# Patient Record
Sex: Female | Born: 1945 | Race: White | Hispanic: No | Marital: Single | Smoking: Never smoker
Health system: Southern US, Community
[De-identification: ages and names within clinical notes are randomized; demographics above are authoritative.]

## PROBLEM LIST (undated history)

## (undated) DIAGNOSIS — M1990 Unspecified osteoarthritis, unspecified site: Secondary | ICD-10-CM

## (undated) DIAGNOSIS — Z9289 Personal history of other medical treatment: Secondary | ICD-10-CM

## (undated) DIAGNOSIS — S7290XA Unspecified fracture of unspecified femur, initial encounter for closed fracture: Secondary | ICD-10-CM

## (undated) DIAGNOSIS — D649 Anemia, unspecified: Secondary | ICD-10-CM

## (undated) DIAGNOSIS — I10 Essential (primary) hypertension: Secondary | ICD-10-CM

## (undated) DIAGNOSIS — R1310 Dysphagia, unspecified: Secondary | ICD-10-CM

## (undated) DIAGNOSIS — R011 Cardiac murmur, unspecified: Secondary | ICD-10-CM

## (undated) DIAGNOSIS — Z87442 Personal history of urinary calculi: Secondary | ICD-10-CM

## (undated) DIAGNOSIS — K219 Gastro-esophageal reflux disease without esophagitis: Secondary | ICD-10-CM

## (undated) DIAGNOSIS — F419 Anxiety disorder, unspecified: Secondary | ICD-10-CM

## (undated) DIAGNOSIS — N189 Chronic kidney disease, unspecified: Secondary | ICD-10-CM

## (undated) HISTORY — PX: ABDOMINAL HYSTERECTOMY: SHX81

## (undated) HISTORY — PX: TUBAL LIGATION: SHX77

---

## 2003-07-02 ENCOUNTER — Ambulatory Visit (HOSPITAL_COMMUNITY): Admission: RE | Admit: 2003-07-02 | Discharge: 2003-07-02 | Payer: Self-pay | Admitting: Family Medicine

## 2007-09-13 ENCOUNTER — Ambulatory Visit (HOSPITAL_COMMUNITY): Admission: RE | Admit: 2007-09-13 | Discharge: 2007-09-13 | Payer: Self-pay | Admitting: Family Medicine

## 2007-09-15 ENCOUNTER — Ambulatory Visit (HOSPITAL_COMMUNITY): Admission: RE | Admit: 2007-09-15 | Discharge: 2007-09-15 | Payer: Self-pay | Admitting: Family Medicine

## 2007-09-24 ENCOUNTER — Emergency Department (HOSPITAL_COMMUNITY): Admission: EM | Admit: 2007-09-24 | Discharge: 2007-09-24 | Payer: Self-pay | Admitting: Emergency Medicine

## 2009-02-26 ENCOUNTER — Ambulatory Visit (HOSPITAL_COMMUNITY): Admission: RE | Admit: 2009-02-26 | Discharge: 2009-02-26 | Payer: Self-pay | Admitting: Family Medicine

## 2009-03-20 ENCOUNTER — Ambulatory Visit (HOSPITAL_COMMUNITY): Admission: RE | Admit: 2009-03-20 | Discharge: 2009-03-20 | Payer: Self-pay | Admitting: Family Medicine

## 2009-04-17 ENCOUNTER — Ambulatory Visit (HOSPITAL_COMMUNITY): Admission: RE | Admit: 2009-04-17 | Discharge: 2009-04-17 | Payer: Self-pay | Admitting: Family Medicine

## 2009-05-31 HISTORY — PX: FEMUR SURGERY: SHX943

## 2010-05-21 ENCOUNTER — Inpatient Hospital Stay (HOSPITAL_COMMUNITY)
Admission: EM | Admit: 2010-05-21 | Discharge: 2010-05-25 | Disposition: A | Discharge status: Rehabilitation Facility | Payer: Self-pay | Source: Home / Self Care | Attending: Orthopedic Surgery | Admitting: Orthopedic Surgery

## 2010-05-25 ENCOUNTER — Inpatient Hospital Stay (HOSPITAL_COMMUNITY)
Admission: RE | Admit: 2010-05-25 | Discharge: 2010-05-30 | Payer: Self-pay | Attending: Physical Medicine & Rehabilitation | Admitting: Physical Medicine & Rehabilitation

## 2010-06-19 NOTE — Discharge Summary (Signed)
[name], [name] NO.:  [id number]  MEDICAL RECORD NO.:  [id number]          PATIENT TYPE:  IPS  LOCATION:  [id number]                         FACILITY:  [facility]  PHYSICIAN:  Beth Malone, M.D.DATE OF BIRTH:  17-Jan-1946  DATE OF ADMISSION:  05/25/2010 DATE OF DISCHARGE:  05/30/2010                              DISCHARGE SUMMARY   DISCHARGE DIAGNOSES: 1. Right supracondylar femur fracture, comminuted and displaced with     ORIF for repair. 2. Diabetes mellitus type 2. 3. Hypertension. 4. Acute blood loss anemia. 5. Hypokalemia. 6. Dyslipidemia. 7. History of proteinuria.  HISTORY OF PRESENT ILLNESS:  Beth Malone is a 65 year old female with history of diabetes mellitus, hypertension who slipped on wet floor at work falling onto her flexed right knee on December 22.  X-rays done showed distal right comminuted supracondylar femur fracture.  The patient underwent ORIF of right supracondylar femur fracture by Dr. Doran Malone on the same day.  Postop is nonweightbearing on right lower extremity with Bledsoe brace locked out at 0 degrees for full extension. Therapy is initiated and currently the patient with difficulty maintaining nonweightbearing status with increased mobility as well as fatigue as well as problems with fatigue.  She is noted to have decrease in endurance level.  Mild hypokalemia noted.  She has also had issues with leukocytosis, question reactive.  This is resolving.  She continues on Coumadin for DVT prophylaxis.  The patient was evaluated by rehab. It was felt that she would benefit from a rehab stay.  PAST MEDICAL HISTORY:  Significant for bilateral knee scope diabetes mellitus, hypertension, hysterectomy in 1981, and dyslipidemia.  ALLERGIES:  Codeine.  FAMILY HISTORY:  Positive for coronary artery disease.  SOCIAL HISTORY:  The patient lives with her daughter and 11 year old mother in 1-level home with ramp at entry.  Has not used any  alcohol since 1981.  Does not use any tobacco.  Mother has a caregiver and daughter currently works days.  FUNCTIONAL HISTORY:  The patient was independent and working prior to admission.  FUNCTIONAL STATUS:  The patient is plus to total assist 60% for transfers, plus to total assist 50-70% for ambulating 5-6 feet forward and backwards.  She is supervision to min assist for upper body care, max assist for lower body care, total assist for toileting.  PHYSICAL EXAM:  VITALS:  Blood pressure 119/75, pulse 100, respiratory rate 18, temperature 99.0. GENERAL:  The patient is pleasant female, alert and oriented x3. HEENT:  Pupils equal, round, reactive to light.  Oral mucosa is pink and moist.  Hearing intact. NECK:  Supple without JVD or lymphadenopathy. HEART:  Regular rate and rhythm without murmurs or gallops. LUNGS: Clear to auscultation bilaterally without wheezes, rales, or rhonchi. ABDOMEN:  Soft, nontender, positive bowel sounds. SKIN:  Notable for dry skin throughout.  Right lateral thigh incision is clean and dry with sutures in place and some old dried blood surrounding the wound.  Noted to have old scar on right cheek. NEUROLOGIC:  Cranial nerves II-XII grossly intact.  Reflexes 1+. Sensation normal.  Judgment, orientation, memory, mood appropriate. Strength in upper  extremities 2+to 5/5 right lower extremity and 1/5 proximally at hip with pain at straight leg raise.  Did not test knee ankle due to Bledsoe brace in place.  Left lower extremities, 3-4/5 at hip to knee and 5/5 at the ankle plantar dorsiflexors.  HOSPITAL COURSE:  Ms. Beth Malone was admitted to rehab on May 25, 2010, for inpatient therapies to consist of PT, OT at least 3 hours 5 days a week.  Past admission physiatrist, rehab RN, and therapy team have worked together to provide customized collaborative interdisciplinary care.  Rehab RN has worked with patient on bowel and bladder program as well as has  helped with pain management.  The patient's blood pressures were checked on b.i.d. basis during this stay and these have been well controlled ranging from low [id number] to [id number] systolics and diastolics in 0000000 range.  Last weight is at 93 kg.  The patient's right thigh incision has been monitored along.  This is noted to be healing well without any signs or symptoms of infection.  Sutures were discontinued and area Steri-Stripped on December 31 without difficulty.  The patient's blood sugars have been checked on a.c. h.s. basis.  These have been reasonable ranging from 110-120s overall.  She is occasionally noted to have high blood sugar in the 160s range.  The patient was noted to have acute blood loss anemia and H and H noted to be at 8.3 and 25.0 at admission.  She was maintained on iron supplements twice a day.  Her H&H has been monitored along and this was noted to drop to 7.7 on December 29.  Stool guaiacs were ordered and one was done and was noted to be negative.  Repeat CBC of December 30 shows H and H back up at 8.4 and 26.6.  Check of lytes past admission revealed sodium 140, potassium 3.7, chloride 99, CO2 30, BUN 12, creatinine 0.8, glucose 128.  The patient was started on potassium supplements due to issues with hypokalemia on acute.  She continues on this currently as noted to have HCTZ on board.  Pain control has been reasonable with p.r.n. use of Vicodin.  During the inpatient stay in rehab, weekly team conference was held to monitor the patient's progress, set goals, as well as discuss barriers to discharge.  At the time of admission, the patient was limited by pain in her right lower extremity as well as weakness in upper and lower extremities with decreased endurance, decreased overall activity tolerance.  The patient was min to mod assist for mobility including ambulating 10 feet x2 with rolling walker.  Physical Therapy has worked with the patient on strengthening as well  as overall mobility. Currently, the patient is modified independent for transfers, modified independent for ambulating 50 feet with a rolling walker.  Improved cadence noted.  She is able to navigate 1 ramp at modified independent level.  She is at supervision level for squat pivot transfers to the car.  OT has worked with the patient on self-care tasks.  At admission, the patient required assist for self-care tasks.  She was also limited but inability to decreased ability to maintain nonweightbearing status and decreased knowledge of use of Adaptic equipment for lower extremity care.  Currently, the patient is able to bathe and dress at bed level keeping leg in full extension and braces off to wash leg.  She does require assisted don and doff brace but can instruct help on how to do this.  She does require  assisted don right shoe as well as set up for bathing.  She is using reacher as needed.  No further follow-up OT needs at this time.  Further followup home health physical therapy to continue past discharge on May 29, 2010.  The patient is discharged to home.  DISCHARGE MEDICATIONS:  Ferrous sulfate 325 mg one p.o. b.i.d., hydrocodone APAP 5/325 1-2 p.o. q. 6 hours p.r.n. moderate-to-severe pain #75 Rx, Robaxin 500 mg p.o. q.6 h. p.r.n. spasms, MiraLax 17 g and 8 ounces a day, K-Dur 20 mEq a day, Coumadin 5 mg p.o. q.p.m., HCTZ 25 mg a day, Lipitor 10 mg p.o. q.p.m., lisinopril 5 mg p.o. per day, Lotrel 10/40 p.o. per day, metformin 500 mg p.o. b.i.d.  Diet is carb-modified medium.  Activity level is at intermittent supervision with use of walker, Bledsoe brace right lower extremity at all times, nonweightbearing right lower extremity.  SPECIAL INSTRUCTIONS:  No alcohol, no smoking, no driving.  Wash incision with antibacterial soap and water and pat dry and put dry dressing in place.  [facility] to provide PT and RN.  Continue diabetic restrictions.  FOLLOWUP:  The  patient to follow up with Dr. Naaman Plummer as needed.  Follow up with Dr. Doran Malone in the next 7-10 days.  Follow up with Dr. Felipa Eth for routine check and labs on June 17, 2010.     Reesa Chew, P.A.   ______________________________ Beth Malone, M.D.    PL/MEDQ  D:  05/29/2010  T:  05/30/2010  Job:  [id number]  cc:   Wylene Simmer, MD Hal T. Stoneking, M.D.  Electronically Signed by Joline Maxcy. on 06/03/2010 03:14:09 PM Electronically Signed by Alger Simons M.D. on 06/19/2010 09:52:27 AM

## 2010-08-10 LAB — CBC
HCT: 23.7 % — ABNORMAL LOW (ref 36.0–46.0)
HCT: 25.3 % — ABNORMAL LOW (ref 36.0–46.0)
HCT: 26.6 % — ABNORMAL LOW (ref 36.0–46.0)
Hemoglobin: 11.6 g/dL — ABNORMAL LOW (ref 12.0–15.0)
Hemoglobin: 7.6 g/dL — ABNORMAL LOW (ref 12.0–15.0)
Hemoglobin: 7.7 g/dL — ABNORMAL LOW (ref 12.0–15.0)
Hemoglobin: 8.3 g/dL — ABNORMAL LOW (ref 12.0–15.0)
Hemoglobin: 8.4 g/dL — ABNORMAL LOW (ref 12.0–15.0)
Hemoglobin: 9.6 g/dL — ABNORMAL LOW (ref 12.0–15.0)
MCH: 28.5 pg (ref 26.0–34.0)
MCH: 29.1 pg (ref 26.0–34.0)
MCHC: 31.7 g/dL (ref 30.0–36.0)
MCHC: 32.8 g/dL (ref 30.0–36.0)
MCHC: 33 g/dL (ref 30.0–36.0)
MCHC: 33.9 g/dL (ref 30.0–36.0)
MCV: 88.1 fL (ref 78.0–100.0)
MCV: 88.8 fL (ref 78.0–100.0)
MCV: 89.1 fL (ref 78.0–100.0)
MCV: 90.2 fL (ref 78.0–100.0)
Platelets: 144 10*3/uL — ABNORMAL LOW (ref 150–400)
Platelets: 154 10*3/uL (ref 150–400)
RBC: 2.72 MIL/uL — ABNORMAL LOW (ref 3.87–5.11)
RBC: 2.95 MIL/uL — ABNORMAL LOW (ref 3.87–5.11)
RBC: 4 MIL/uL (ref 3.87–5.11)
RDW: 14.2 % (ref 11.5–15.5)
RDW: 14.4 % (ref 11.5–15.5)
RDW: 14.6 % (ref 11.5–15.5)
WBC: 10.8 10*3/uL — ABNORMAL HIGH (ref 4.0–10.5)
WBC: 12.2 10*3/uL — ABNORMAL HIGH (ref 4.0–10.5)
WBC: 17.2 10*3/uL — ABNORMAL HIGH (ref 4.0–10.5)

## 2010-08-10 LAB — GLUCOSE, CAPILLARY
Glucose-Capillary: 110 mg/dL — ABNORMAL HIGH (ref 70–99)
Glucose-Capillary: 113 mg/dL — ABNORMAL HIGH (ref 70–99)
Glucose-Capillary: 115 mg/dL — ABNORMAL HIGH (ref 70–99)
Glucose-Capillary: 118 mg/dL — ABNORMAL HIGH (ref 70–99)
Glucose-Capillary: 120 mg/dL — ABNORMAL HIGH (ref 70–99)
Glucose-Capillary: 127 mg/dL — ABNORMAL HIGH (ref 70–99)
Glucose-Capillary: 132 mg/dL — ABNORMAL HIGH (ref 70–99)
Glucose-Capillary: 138 mg/dL — ABNORMAL HIGH (ref 70–99)
Glucose-Capillary: 143 mg/dL — ABNORMAL HIGH (ref 70–99)
Glucose-Capillary: 146 mg/dL — ABNORMAL HIGH (ref 70–99)
Glucose-Capillary: 149 mg/dL — ABNORMAL HIGH (ref 70–99)
Glucose-Capillary: 157 mg/dL — ABNORMAL HIGH (ref 70–99)
Glucose-Capillary: 161 mg/dL — ABNORMAL HIGH (ref 70–99)
Glucose-Capillary: 166 mg/dL — ABNORMAL HIGH (ref 70–99)
Glucose-Capillary: 168 mg/dL — ABNORMAL HIGH (ref 70–99)
Glucose-Capillary: 182 mg/dL — ABNORMAL HIGH (ref 70–99)
Glucose-Capillary: 183 mg/dL — ABNORMAL HIGH (ref 70–99)
Glucose-Capillary: 185 mg/dL — ABNORMAL HIGH (ref 70–99)
Glucose-Capillary: 90 mg/dL (ref 70–99)

## 2010-08-10 LAB — CROSSMATCH
ABO/RH(D): O POS
Antibody Screen: NEGATIVE
Unit division: 0

## 2010-08-10 LAB — BASIC METABOLIC PANEL
BUN: 10 mg/dL (ref 6–23)
BUN: 7 mg/dL (ref 6–23)
CO2: 23 mEq/L (ref 19–32)
Calcium: 7.6 mg/dL — ABNORMAL LOW (ref 8.4–10.5)
Calcium: 7.9 mg/dL — ABNORMAL LOW (ref 8.4–10.5)
Calcium: 7.9 mg/dL — ABNORMAL LOW (ref 8.4–10.5)
Calcium: 8.5 mg/dL (ref 8.4–10.5)
Creatinine, Ser: 0.75 mg/dL (ref 0.4–1.2)
Creatinine, Ser: 0.79 mg/dL (ref 0.4–1.2)
GFR calc Af Amer: >60 mL/min (ref >60)
GFR calc Af Amer: >60 mL/min (ref >60)
GFR calc Af Amer: >60 mL/min (ref >60)
GFR calc non Af Amer: >60 mL/min (ref >60)
GFR calc non Af Amer: >60 mL/min (ref >60)
GFR calc non Af Amer: >60 mL/min (ref >60)
GFR calc non Af Amer: >60 mL/min (ref >60)
GFR calc non Af Amer: >60 mL/min (ref >60)
Glucose, Bld: 142 mg/dL — ABNORMAL HIGH (ref 70–99)
Glucose, Bld: 172 mg/dL — ABNORMAL HIGH (ref 70–99)
Glucose, Bld: 175 mg/dL — ABNORMAL HIGH (ref 70–99)
Glucose, Bld: 194 mg/dL — ABNORMAL HIGH (ref 70–99)
Potassium: 3.4 mEq/L — ABNORMAL LOW (ref 3.5–5.1)
Sodium: 135 mEq/L (ref 135–145)
Sodium: 135 mEq/L (ref 135–145)
Sodium: 138 mEq/L (ref 135–145)
Sodium: 140 mEq/L (ref 135–145)

## 2010-08-10 LAB — URINALYSIS, ROUTINE W REFLEX MICROSCOPIC
Bilirubin Urine: NEGATIVE
Ketones, ur: NEGATIVE mg/dL
Nitrite: NEGATIVE
Protein, ur: NEGATIVE mg/dL
Urobilinogen, UA: 0.2 mg/dL (ref 0.0–1.0)
pH: 5.5 (ref 5.0–8.0)

## 2010-08-10 LAB — PROTIME-INR
INR: 1.68 — ABNORMAL HIGH (ref 0.00–1.49)
INR: 1.76 — ABNORMAL HIGH (ref 0.00–1.49)
INR: 2.11 — ABNORMAL HIGH (ref 0.00–1.49)
INR: 2.16 — ABNORMAL HIGH (ref 0.00–1.49)
INR: 2.34 — ABNORMAL HIGH (ref 0.00–1.49)
Prothrombin Time: 20 seconds — ABNORMAL HIGH (ref 11.6–15.2)
Prothrombin Time: 22.6 seconds — ABNORMAL HIGH (ref 11.6–15.2)
Prothrombin Time: 24.2 seconds — ABNORMAL HIGH (ref 11.6–15.2)

## 2010-08-10 LAB — POCT I-STAT 4, (NA,K, GLUC, HGB,HCT)
Glucose, Bld: 185 mg/dL — ABNORMAL HIGH (ref 70–99)
Glucose, Bld: 234 mg/dL — ABNORMAL HIGH (ref 70–99)
Hemoglobin: 10.2 g/dL — ABNORMAL LOW (ref 12.0–15.0)
Hemoglobin: 9.9 g/dL — ABNORMAL LOW (ref 12.0–15.0)
Potassium: 4.1 mEq/L (ref 3.5–5.1)
Sodium: 140 mEq/L (ref 135–145)

## 2010-08-10 LAB — COMPREHENSIVE METABOLIC PANEL
ALT: 18 U/L (ref 0–35)
BUN: 12 mg/dL (ref 6–23)
CO2: 30 mEq/L (ref 19–32)
Calcium: 8.6 mg/dL (ref 8.4–10.5)
Creatinine, Ser: 0.78 mg/dL (ref 0.4–1.2)
GFR calc non Af Amer: >60 mL/min (ref >60)
Glucose, Bld: 128 mg/dL — ABNORMAL HIGH (ref 70–99)
Total Protein: 5.6 g/dL — ABNORMAL LOW (ref 6.0–8.3)

## 2010-08-10 LAB — URINE CULTURE
Culture  Setup Time: 201112222153
Culture: NO GROWTH

## 2010-08-10 LAB — DIFFERENTIAL
Eosinophils Absolute: 0.4 10*3/uL (ref 0.0–0.7)
Lymphocytes Relative: 16 % (ref 12–46)
Lymphocytes Relative: 36 % (ref 12–46)
Lymphs Abs: 2.7 10*3/uL (ref 0.7–4.0)
Lymphs Abs: 3.2 10*3/uL (ref 0.7–4.0)
Monocytes Relative: 10 % (ref 3–12)
Monocytes Relative: 4 % (ref 3–12)
Neutro Abs: 13.7 10*3/uL — ABNORMAL HIGH (ref 1.7–7.7)
Neutro Abs: 4.3 10*3/uL (ref 1.7–7.7)
Neutrophils Relative %: 49 % (ref 43–77)
Neutrophils Relative %: 80 % — ABNORMAL HIGH (ref 43–77)

## 2010-08-10 LAB — HEMOGLOBIN A1C
Hgb A1c MFr Bld: 7.6 % — ABNORMAL HIGH (ref <5.7)
Mean Plasma Glucose: 171 mg/dL — ABNORMAL HIGH (ref <117)

## 2010-08-10 LAB — ABO/RH: ABO/RH(D): O POS

## 2010-08-10 LAB — STOOL CULTURE

## 2010-08-10 LAB — POCT I-STAT GLUCOSE: Operator id: 153281

## 2011-01-08 ENCOUNTER — Ambulatory Visit (HOSPITAL_COMMUNITY)
Admission: RE | Admit: 2011-01-08 | Discharge: 2011-01-08 | Disposition: A | Discharge status: Home / Self Care | Payer: Worker's Compensation | Source: Ambulatory Visit | Attending: Orthopedic Surgery | Admitting: Orthopedic Surgery

## 2011-01-08 ENCOUNTER — Encounter (HOSPITAL_COMMUNITY)
Admission: RE | Admit: 2011-01-08 | Discharge: 2011-01-08 | Disposition: A | Discharge status: Home / Self Care | Payer: Worker's Compensation | Source: Ambulatory Visit | Attending: Orthopedic Surgery | Admitting: Orthopedic Surgery

## 2011-01-08 ENCOUNTER — Other Ambulatory Visit (HOSPITAL_COMMUNITY): Payer: Self-pay | Admitting: Orthopedic Surgery

## 2011-01-08 DIAGNOSIS — S72144K Nondisplaced intertrochanteric fracture of right femur, subsequent encounter for closed fracture with nonunion: Secondary | ICD-10-CM

## 2011-01-08 DIAGNOSIS — IMO0002 Reserved for concepts with insufficient information to code with codable children: Secondary | ICD-10-CM | POA: Insufficient documentation

## 2011-01-08 DIAGNOSIS — Z01818 Encounter for other preprocedural examination: Secondary | ICD-10-CM | POA: Insufficient documentation

## 2011-01-08 DIAGNOSIS — I10 Essential (primary) hypertension: Secondary | ICD-10-CM | POA: Insufficient documentation

## 2011-01-08 LAB — BASIC METABOLIC PANEL
BUN: 15 mg/dL (ref 6–23)
Chloride: 102 mEq/L (ref 96–112)
GFR calc Af Amer: >60 mL/min (ref >60)
Potassium: 4.2 mEq/L (ref 3.5–5.1)

## 2011-01-08 LAB — CBC
HCT: 39.9 % (ref 36.0–46.0)
Platelets: 234 10*3/uL (ref 150–400)
RDW: 13.6 % (ref 11.5–15.5)
WBC: 10.9 10*3/uL — ABNORMAL HIGH (ref 4.0–10.5)

## 2011-01-08 LAB — SURGICAL PCR SCREEN
MRSA, PCR: NEGATIVE
Staphylococcus aureus: POSITIVE — AB

## 2011-01-12 ENCOUNTER — Ambulatory Visit (HOSPITAL_COMMUNITY): Payer: Worker's Compensation

## 2011-01-12 ENCOUNTER — Inpatient Hospital Stay (HOSPITAL_COMMUNITY)
Admission: RE | Admit: 2011-01-12 | Discharge: 2011-01-14 | DRG: 482 | Disposition: A | Discharge status: Home Health | Payer: Worker's Compensation | Source: Ambulatory Visit | Attending: Orthopedic Surgery | Admitting: Orthopedic Surgery

## 2011-01-12 DIAGNOSIS — W19XXXS Unspecified fall, sequela: Secondary | ICD-10-CM

## 2011-01-12 DIAGNOSIS — E119 Type 2 diabetes mellitus without complications: Secondary | ICD-10-CM | POA: Diagnosis present

## 2011-01-12 DIAGNOSIS — IMO0002 Reserved for concepts with insufficient information to code with codable children: Principal | ICD-10-CM | POA: Diagnosis present

## 2011-01-12 DIAGNOSIS — E7800 Pure hypercholesterolemia, unspecified: Secondary | ICD-10-CM | POA: Diagnosis present

## 2011-01-12 DIAGNOSIS — I10 Essential (primary) hypertension: Secondary | ICD-10-CM | POA: Diagnosis present

## 2011-01-12 DIAGNOSIS — S8290XS Unspecified fracture of unspecified lower leg, sequela: Secondary | ICD-10-CM

## 2011-01-12 LAB — GLUCOSE, CAPILLARY
Glucose-Capillary: 118 mg/dL — ABNORMAL HIGH (ref 70–99)
Glucose-Capillary: 173 mg/dL — ABNORMAL HIGH (ref 70–99)

## 2011-01-13 LAB — GLUCOSE, CAPILLARY
Glucose-Capillary: 127 mg/dL — ABNORMAL HIGH (ref 70–99)
Glucose-Capillary: 147 mg/dL — ABNORMAL HIGH (ref 70–99)

## 2011-01-13 LAB — CBC
HCT: 27.5 % — ABNORMAL LOW (ref 36.0–46.0)
MCV: 87.9 fL (ref 78.0–100.0)
Platelets: 188 10*3/uL (ref 150–400)
RBC: 3.13 MIL/uL — ABNORMAL LOW (ref 3.87–5.11)
WBC: 10.5 10*3/uL (ref 4.0–10.5)

## 2011-01-13 LAB — BASIC METABOLIC PANEL
BUN: 12 mg/dL (ref 6–23)
CO2: 26 mEq/L (ref 19–32)
Chloride: 103 mEq/L (ref 96–112)
Creatinine, Ser: 0.71 mg/dL (ref 0.50–1.10)
Potassium: 3.5 mEq/L (ref 3.5–5.1)

## 2011-01-13 NOTE — Op Note (Signed)
Beth Malone, Beth Malone NO.:  [id number]  MEDICAL RECORD NO.:  [id number]  LOCATION:  5036                         FACILITY:  [facility]  PHYSICIAN:  Wylene Simmer, MD        DATE OF BIRTH:  [birth year]  DATE OF PROCEDURE:  01/12/2011 DATE OF DISCHARGE:                              OPERATIVE REPORT   PREOPERATIVE DIAGNOSIS:  Right femoral shaft nonunion, status post open reduction and internal fixation.  POSTOPERATIVE DIAGNOSIS:  Right femoral shaft nonunion, status post open reduction and internal fixation.  PROCEDURES: 1. Removal of deep implants from the right femur. 2. Treatment of right femur non-union 3. Open reduction and internal fixation of right femur fracture with     retrograde intramedullary nail. 3. Intraoperative interpretation of fluoroscopic images greater than 1     hour. 4. Implantation of recombinant human bone morphogenetic protein (OP-     1).  SURGEON:  Wylene Simmer, MD  ASSISTANT:  Laure Kidney, RNFA  ANESTHESIA:  General.  SPECIMEN:  Deep tissue from the fracture site to Microbiology for culture.  ESTIMATED BLOOD LOSS:  150 mL.  TOURNIQUET TIME:  Two hours and 9 minutes at 250 mmHg.  COMPLICATIONS:  None apparent.  DISPOSITION:  Extubated, awake and stable to recovery.  INDICATIONS FOR PROCEDURE:  The patient is a 65 year old female with past medical history significant for type 2 diabetes who fell at work approximately 7 months ago fracturing her right distal femur.  She was noted at that time to have a transverse fracture at the metaphyseal- diaphyseal junction as well as a longitudinal intercondylar split.  She underwent open reduction and internal fixation of this injury with a lateral distal femoral plate.  Approximately, 7 months postoperatively, she has still not healed the transverse component of this fracture.  A CT was obtained showing the intercondylar split to be healed, but the transverse fracture to be a  nonunion.  She presents now for revision surgery with removal of hardware and intramedullary nailing of her femur fracture.  She understands the risks and benefits of this procedure as well as the alternative treatment options.  Specifically, she understands risks of bleeding, infection, nerve damage, blood clots, need for additional surgery, amputation, and death.  She elects to proceed.  PROCEDURE IN DETAIL:  After preoperative consent was obtained, the correct operative site was identified.  The patient was brought to the operating room and placed supine on the operating table.  General anesthesia was induced.  Preoperative antibiotics were administered. Surgical time-out was taken.  The right lower extremity was prepped and draped in a standard sterile fashion.  A sterile tourniquet was applied at the proximal right thigh.  The extremity was exsanguinated and tourniquet was inflated to 250 mmHg.  The patient's previous lateral thigh incision was identified.  This incision was made and sharp dissection was carried down through the skin and subcutaneous tissue tothe level of the IT band.  The IT band was split in line with its fibers.  The plate was identified.  Superficial soft tissue was removed from all of the screw holes.  All of the screws in the plate were removed.  The plate was removed in its entirety.  The two remaining screws and the distal femur were identified on fluoroscopic AP images. These two screws were removed in their entirety as well.  At this point, the nonunion site was identified on AP fluoroscopic images.  The nonunion site was opened and all fibrous tissue was removed with a combination of curettes, rongeurs, and sharp dissection.  The fracture ends were mobilized with a Key elevator proximally and distally.  The fracture was pulled out to length.  A large lobster claw clamp was used to hold it in place.  AP and lateral x-rays showed appropriate reduction of  the fracture.  Good bony contact was maintained with this fracture clamped with a lobster claw.  The leg was then positioned on a radiolucent triangle.  A longitudinal incision was made from the inferior pole of patella to the tibial tubercle.  Sharp dissection was carried down to the patellar retinaculum.  This was incised in line with the fibers of patellar tendon.  Patellar tendon itself was then incised longitudinally in the midline.  A deep retractor was placed to hold this operative site open.  Fat pad was dissected.  Intercondylar notch was identified.  A straight starting guidewire was then inserted into the intercondylar notch in line with the femoral shaft.  AP and lateral images showed appropriate position of the guide wire.  A starting reamer was used to open the femoral canal.  The guidewire was then passed across the fracture site and into the femur proximally and was advanced to the level of the lesser trochanter.  The appropriate position was verified on AP and lateral fluoroscopic images.  At this point, the guidewire was measured and a 36-mm length was determined as the appropriate nail length.  The femoral canal was then sequentially reamed from 8 mm to 13.5 mm.  A 360 mm x 12 mm Biomet [place] retrograde nail was selected.  It was inserted over the guidewire and impacted into position and countersunk to a level of 5 mm.  The distal targeting jig was then applied to the nail.  Two transverse interlocking screws were inserted in bicortical fashion distally.  The lateral oblique screw was also inserted.  The medial oblique screw was inserted through a stab incision.  AP, lateral, and oblique views of the distal femur showed appropriate position and length of the interlocking screws.  At this point, a mallet was used to impact the end of the nail compressing the fracture site.  AP and lateral views of fracture site verified appropriate compression.  The radiolucent triangle  was removed.  The perfect circle was obtained at the proximal interlocking hole at the nail.  Stab incision was made and blunt dissection was carried down to the anterior cortex of the femur.  The perfect circle technique was then used to drill and insert a fully-threaded screw in a bicortical fashion.  At this point, the lateral wound was again approached.  The wound was irrigated copiously with 3 liters of normal saline.  The knee joint was also irrigated copiously.  A double batch of OP-1 was then inserted at the fracture site medially, laterally, and posteriorly.  The tourniquet had been released prior to insertion of the OP-1 and hemostasis was achieved prior to insertion as well.  At this point, the IT band was closed with in a watertight fashion with 0 Vicryl figure-of-eight sutures.  The lateral subcutaneous tissue was closed with inverted simple sutures of 2-0 Vicryl and  3-0 Monocryl.  Attention was turned to the knee incision.  Patellar retinaculum was repaired with simple sutures of 0 Vicryl.  Subcutaneous tissue was closed with inverted simple sutures of 3-0 Monocryl.  The two skin incisions were closed with running 3-0 Prolene sutures.  All of the stab incisions were closed with simple sutures of 3-0 Prolene.  Sterile dressings were applied after 0.5% Marcaine was infiltrated into the subcutaneous tissues at the incisions.  A long 6-inch Ace wrap was wrapped from the toes to the groin.  The patient was awakened by Anesthesia and transported to the recovery room in stable condition.  FOLLOWUP PLAN:  The patient will be partial weightbearing on her right lower extremity.  She will have physical therapy.  She will be observed at least overnight for pain control.     Wylene Simmer, MD     JH/MEDQ  D:  01/12/2011  T:  01/13/2011  Job:  [id number]  Electronically Signed by Jenny Reichmann Ariadne Rissmiller  on 01/13/2011 08:29:05 AM

## 2011-01-14 LAB — GLUCOSE, CAPILLARY

## 2011-01-15 LAB — TISSUE CULTURE

## 2011-01-17 LAB — ANAEROBIC CULTURE

## 2011-01-20 NOTE — Discharge Summary (Signed)
  NAMESIARAH, SALYARDS NO.:  [id number]  MEDICAL RECORD NO.:  [id number]  LOCATION:  5036                         FACILITY:  [facility]  PHYSICIAN:  Wylene Simmer, MD        DATE OF BIRTH:  05/26/1946  DATE OF ADMISSION:  01/12/2011 DATE OF DISCHARGE:  01/14/2011                              DISCHARGE SUMMARY   ADMISSION DIAGNOSES: 1. 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 2. Hypertension. 3. Type 2 diabetes. 4. Hypercholesterolemia.  POSTOPERATIVE DIAGNOSES: 1. 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 2. Hypertension. 3. Type 2 diabetes. 4. Hypercholesterolemia. 5. Status post removal of hardware from the right femur with treatment     of femoral nonunion and intramedullary nailing of right femur     fracture.  HISTORY OF PRESENT ILLNESS:  The patient is a 65 year old woman who underwent ORIF of the right femur fracture about 8 months ago.  She had a successful healing of the intercondylar split, but went on to nonunion of the femoral shaft fracture.  She presents now for treatment of this symptomatic nonunion.  HOSPITAL COURSE:  The patient was admitted to hospital on January 12, 2011, she was taken to surgery that day where she underwent removal of her right femur plate takedown of the nonunion and retrograde femoral nail at the right femur fracture site.  She also had implantation of recombinant human bone morphogenic protein (OP-1).  She tolerated this procedure well and was transferred back to the inpatient ward where she remained for the duration of her hospital stay.  Her postoperative course was unremarkable.  She did well with physical therapy.  She had negative tissue cultures while an inpatient.  She was discharged to home in stable condition on January 14, 2011.  DISCHARGE CONDITION:  Stable.  WEIGHTBEARING STATUS:  Toe-touch  weightbearing on the right lower extremity with a walker.  DISCHARGE MEDICATIONS:  See discharge medication reconciliation sheet.  FOLLOWUP PLAN:  The patient will be toe-touch weightbearing on her right lower extremity.  She will follow up with me on January 28, 2011, in the [place] office.  She will be on Lovenox for DVT prophylaxis for the next 12 days.     Wylene Simmer, MD     JH/MEDQ  D:  01/14/2011  T:  01/14/2011  Job:  [id number]  Electronically Signed by Wylene Simmer  on 01/20/2011 08:21:33 AM

## 2011-02-17 ENCOUNTER — Other Ambulatory Visit: Payer: Self-pay | Admitting: Geriatric Medicine

## 2011-02-17 DIAGNOSIS — Z1231 Encounter for screening mammogram for malignant neoplasm of breast: Secondary | ICD-10-CM

## 2011-02-24 ENCOUNTER — Ambulatory Visit
Admission: RE | Admit: 2011-02-24 | Discharge: 2011-02-24 | Disposition: A | Discharge status: Home / Self Care | Payer: Worker's Compensation | Source: Ambulatory Visit | Attending: Geriatric Medicine | Admitting: Geriatric Medicine

## 2011-02-24 DIAGNOSIS — Z1231 Encounter for screening mammogram for malignant neoplasm of breast: Secondary | ICD-10-CM

## 2011-09-20 ENCOUNTER — Other Ambulatory Visit: Payer: Self-pay | Admitting: Gastroenterology

## 2011-09-20 ENCOUNTER — Other Ambulatory Visit (HOSPITAL_COMMUNITY): Payer: Self-pay | Admitting: Gastroenterology

## 2011-09-20 DIAGNOSIS — R933 Abnormal findings on diagnostic imaging of other parts of digestive tract: Secondary | ICD-10-CM

## 2011-09-22 ENCOUNTER — Ambulatory Visit (HOSPITAL_COMMUNITY)
Admission: RE | Admit: 2011-09-22 | Discharge: 2011-09-22 | Disposition: A | Discharge status: Home / Self Care | Payer: BC Managed Care – PPO | Source: Ambulatory Visit | Attending: Gastroenterology | Admitting: Gastroenterology

## 2011-09-22 DIAGNOSIS — R933 Abnormal findings on diagnostic imaging of other parts of digestive tract: Secondary | ICD-10-CM

## 2011-09-22 DIAGNOSIS — K5730 Diverticulosis of large intestine without perforation or abscess without bleeding: Secondary | ICD-10-CM | POA: Insufficient documentation

## 2011-09-23 ENCOUNTER — Ambulatory Visit
Admission: RE | Admit: 2011-09-23 | Discharge: 2011-09-23 | Disposition: A | Discharge status: Home / Self Care | Payer: BC Managed Care – PPO | Source: Ambulatory Visit | Attending: Gastroenterology | Admitting: Gastroenterology

## 2011-10-31 ENCOUNTER — Emergency Department (HOSPITAL_COMMUNITY): Payer: BC Managed Care – PPO

## 2011-10-31 ENCOUNTER — Encounter (HOSPITAL_COMMUNITY): Payer: Self-pay | Admitting: *Deleted

## 2011-10-31 ENCOUNTER — Emergency Department (HOSPITAL_COMMUNITY)
Admission: EM | Admit: 2011-10-31 | Discharge: 2011-10-31 | Disposition: A | Discharge status: Home / Self Care | Payer: BC Managed Care – PPO | Attending: Emergency Medicine | Admitting: Emergency Medicine

## 2011-10-31 DIAGNOSIS — I10 Essential (primary) hypertension: Secondary | ICD-10-CM | POA: Insufficient documentation

## 2011-10-31 DIAGNOSIS — W010XXA Fall on same level from slipping, tripping and stumbling without subsequent striking against object, initial encounter: Secondary | ICD-10-CM | POA: Insufficient documentation

## 2011-10-31 DIAGNOSIS — S42201A Unspecified fracture of upper end of right humerus, initial encounter for closed fracture: Secondary | ICD-10-CM

## 2011-10-31 DIAGNOSIS — E119 Type 2 diabetes mellitus without complications: Secondary | ICD-10-CM | POA: Insufficient documentation

## 2011-10-31 DIAGNOSIS — S42209A Unspecified fracture of upper end of unspecified humerus, initial encounter for closed fracture: Secondary | ICD-10-CM | POA: Insufficient documentation

## 2011-10-31 HISTORY — DX: Unspecified fracture of unspecified femur, initial encounter for closed fracture: S72.90XA

## 2011-10-31 HISTORY — DX: Anxiety disorder, unspecified: F41.9

## 2011-10-31 HISTORY — DX: Essential (primary) hypertension: I10

## 2011-10-31 MED ORDER — HYDROMORPHONE HCL PF 1 MG/ML IJ SOLN
1.0000 mg | Freq: Once | INTRAMUSCULAR | Status: AC
  Administered 2011-10-31: 1 mg via INTRAMUSCULAR
  Filled 2011-10-31: qty 1

## 2011-10-31 MED ORDER — OXYCODONE-ACETAMINOPHEN 5-325 MG PO TABS: 1.0000 | ORAL_TABLET | Freq: Four times a day (QID) | ORAL | Status: AC | PRN

## 2011-10-31 NOTE — ED Notes (Signed)
Left in c/o family for transport home.  Instructions/prescriptions reviewed and f/u information provided; verbalizes understanding of instructions given-no questions. Escorted out of ED in wheelchair.

## 2011-10-31 NOTE — ED Notes (Signed)
Pt was walking on steps when she stumbled and fell, pt landing on right arm area, pt has pain to right upper arm area. Cms intact distal

## 2011-10-31 NOTE — ED Provider Notes (Signed)
History    This chart was scribed for [employer]. Alvino Chapel, MD, MD by Rhae Lerner. The patient was seen in room APA10 and the patient's care was started at 4:14PM.   CSN: [id number]  Arrival date & time 10/31/11  1522   First MD Initiated Contact with Patient 10/31/11 1611      Chief Complaint  Patient presents with  . Fall  . Arm Pain    (Consider location/radiation/quality/duration/timing/severity/associated sxs/prior treatment) Patient is a 66 y.o. female presenting with fall and arm pain. The history is provided by the patient.  Fall Pertinent negatives include no numbness and no abdominal pain.  Arm Pain Pertinent negatives include no chest pain, no abdominal pain and no shortness of breath.   DAURICE SHADDIX is a 66 y.o. female who presents to the Emergency Department complaining of moderate right arm and shoulder pain onset today. Pt reports that she fell due to stumbling while walking. Denies LOC and head injury. Symptoms have been constant since onset. Pt has hx of HTN, femur fracture and diabetes.  Orthopedic Surgeon is Dr. Doran Durand. No numbness weakness. No other injury.  Past Medical History  Diagnosis Date  . Diabetes mellitus   . Hypertension   . Anxiety   . Femur fracture     Past Surgical History  Procedure Date  . Femur surgery   . Abdominal hysterectomy     No family history on file.  History  Substance Use Topics  . Smoking status: Never Smoker   . Smokeless tobacco: Not on file  . Alcohol Use: No    OB History    Grav Para Term Preterm Abortions TAB SAB Ect Mult Living                  Review of Systems  Constitutional: Negative for chills and fatigue.  Respiratory: Negative for shortness of breath.   Cardiovascular: Negative for chest pain.  Gastrointestinal: Negative for abdominal pain.  Musculoskeletal: Positive for joint swelling. Negative for back pain and gait problem.  Skin: Negative for rash and wound.  Neurological: Negative for  weakness and numbness.  10 Systems reviewed and all are negative for acute change except as noted in the HPI.    Allergies  Codeine  Home Medications   Current Outpatient Rx  Name Route Sig Dispense Refill  . ALPRAZOLAM 0.25 MG PO TABS Oral Take 0.25 mg by mouth as needed. anxiety    . AMLODIPINE BESY-BENAZEPRIL HCL 10-40 MG PO CAPS Oral Take 1 capsule by mouth daily.    Marland Kitchen HYDROCHLOROTHIAZIDE 25 MG PO TABS Oral Take 25 mg by mouth daily.    Marland Kitchen METFORMIN HCL 500 MG PO TABS Oral Take 500 mg by mouth 2 (two) times daily.    . OXYCODONE-ACETAMINOPHEN 5-325 MG PO TABS Oral Take 1-2 tablets by mouth every 6 (six) hours as needed for pain. 20 tablet 0    Ht 5' 5.5" (1.664 m)  Wt 200 lb (90.719 kg)  BMI 32.78 kg/m2  Physical Exam  Nursing note and vitals reviewed. Constitutional: She is oriented to person, place, and time. She appears well-developed and well-nourished.  HENT:  Head: Normocephalic and atraumatic.  Eyes: Conjunctivae are normal. Pupils are equal, round, and reactive to light.  Neck: Normal range of motion. Neck supple.  Cardiovascular: Normal rate, regular rhythm and normal heart sounds.   Pulmonary/Chest: Effort normal and breath sounds normal. No respiratory distress.  Musculoskeletal: She exhibits no edema.  Tender over humerus Small abrasion to right lateral lower leg No tenderness to right wrist, right forearm,right elbow ROM at right wrist is nl Pt is neurovascularly intact No cervical tenderness  Neurological: She is alert and oriented to person, place, and time.  Skin: Skin is warm and dry.  Psychiatric: She has a normal mood and affect. Her behavior is normal.    ED Course  Procedures (including critical care time)  COORDINATION OF CARE: 4:21PM EDP discusses pt ED treatment  4:22PM EDP orders medication: Dilaudid 1 mg   Labs Reviewed - No data to display Dg Humerus Right  10/31/2011  *RADIOLOGY REPORT*  Clinical Data: Fall, proximal right  humerus pain.  RIGHT HUMERUS - 2+ VIEW  Comparison: None.  Findings: There is a slightly impacted appearing right humeral neck fracture.  No dislocation.  Advanced degenerative changes in the right shoulder.  IMPRESSION: Mildly impacted right humeral neck fracture.  Original Report Authenticated By: Raelyn Number, M.D.     1. Proximal humeral fracture, right, closed, initial encounter       MDM  Proximal humerus fracture after fall. No other apparent injury. Patient heart has an orthopedic surgeon in followup. She was immobilized in a shoulder immobilizer. She was also given pain medications both here and for home  I personally performed the services described in this documentation, which was scribed in my presence. The recorded information has been reviewed and considered.        Jasper Riling. Alvino Chapel, MD 10/31/11 1649

## 2011-10-31 NOTE — ED Notes (Signed)
Shoulder immobilizer applied right shoulder. Pt tolerated well.

## 2011-11-03 ENCOUNTER — Other Ambulatory Visit (HOSPITAL_COMMUNITY): Payer: Self-pay | Admitting: Orthopedic Surgery

## 2011-11-03 ENCOUNTER — Ambulatory Visit (HOSPITAL_COMMUNITY)
Admission: RE | Admit: 2011-11-03 | Discharge: 2011-11-03 | Disposition: A | Discharge status: Home / Self Care | Payer: BC Managed Care – PPO | Source: Ambulatory Visit | Attending: Orthopedic Surgery | Admitting: Orthopedic Surgery

## 2011-11-03 DIAGNOSIS — S42209A Unspecified fracture of upper end of unspecified humerus, initial encounter for closed fracture: Secondary | ICD-10-CM

## 2011-11-03 DIAGNOSIS — S42253A Displaced fracture of greater tuberosity of unspecified humerus, initial encounter for closed fracture: Secondary | ICD-10-CM | POA: Insufficient documentation

## 2011-11-03 DIAGNOSIS — W19XXXA Unspecified fall, initial encounter: Secondary | ICD-10-CM | POA: Insufficient documentation

## 2011-11-03 DIAGNOSIS — M25519 Pain in unspecified shoulder: Secondary | ICD-10-CM | POA: Insufficient documentation

## 2012-01-17 IMAGING — CR DG KNEE 1-2V*R*
2 series · 2 of 2 positions shown · non-contrast
Comparison: None.

CLINICAL DATA: Recent fall

RIGHT KNEE - 1-2 VIEW

[view not recorded (1 of 2)]
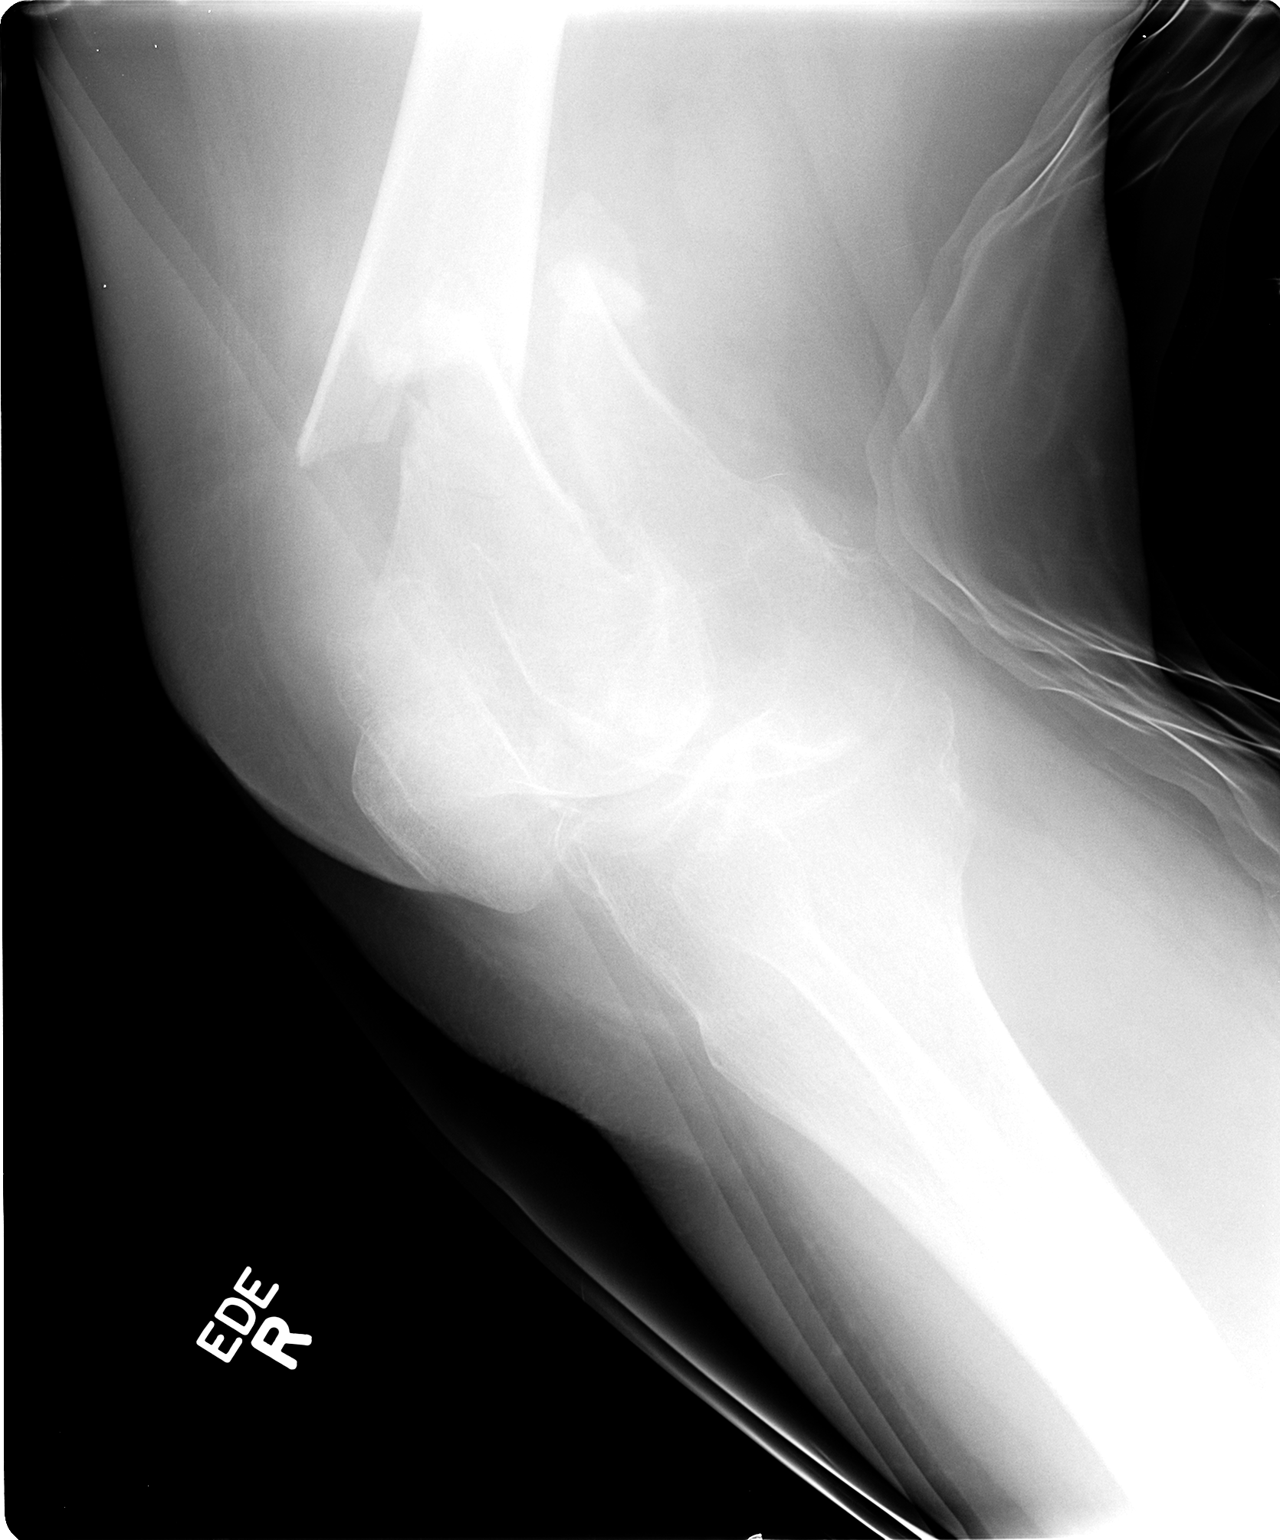

[view not recorded (2 of 2)]
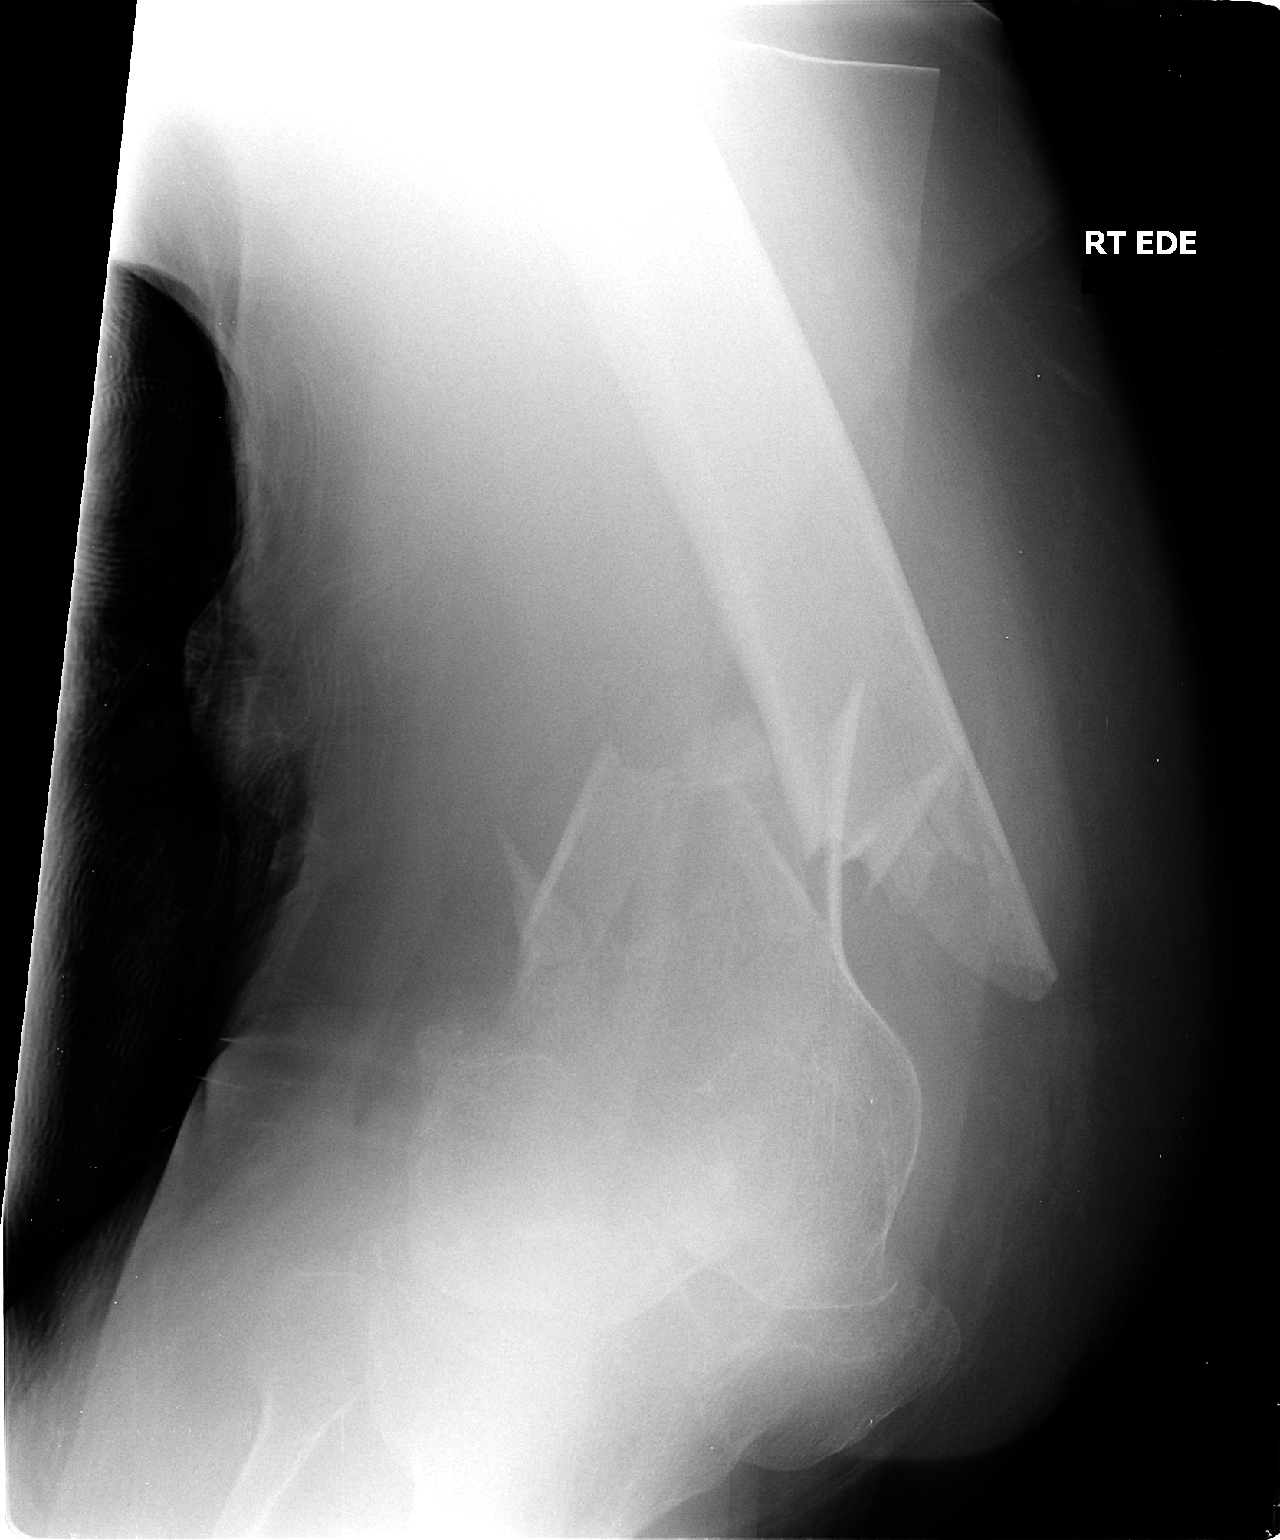

[2 of 2 positions shown; findings below may reference images not displayed]

FINDINGS: There is comminuted displaced fracture of the distal
right femoral shaft immediately above the femoral condyles.  When
possible additional images are recommended to assess the integrity
of the condyles as well.  The proximal tibia and fibula appear
intact.
IMPRESSION: Grossly comminuted displaced fracture of the distal right femur
above the condyles.  Recommend follow-up views when possible for
further assessment as noted above.

## 2012-01-17 IMAGING — CR DG FEMUR 2+V*R*
2 series · 2 of 2 positions shown · non-contrast
Comparison: 05/21/2010

CLINICAL DATA: Fall.  Femur fracture

RIGHT FEMUR - 2 VIEW

[view not recorded (1 of 2)]
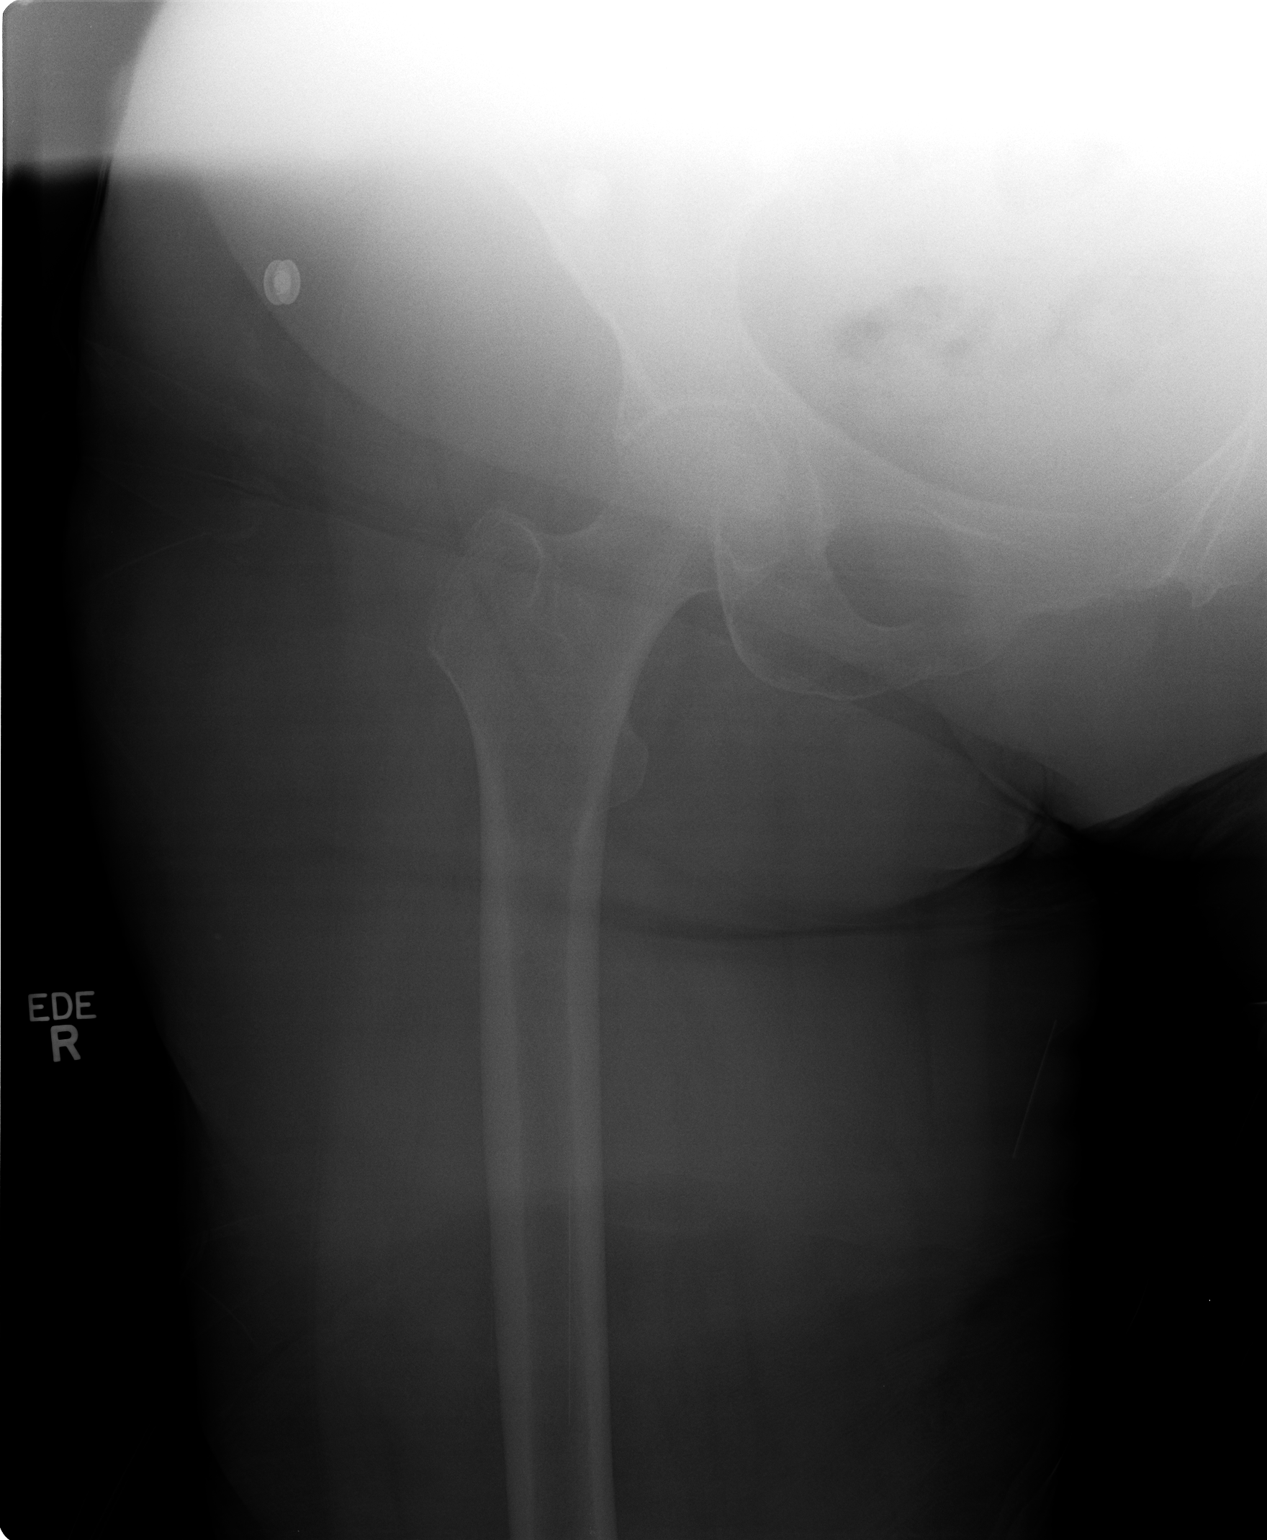

[view not recorded (2 of 2)]
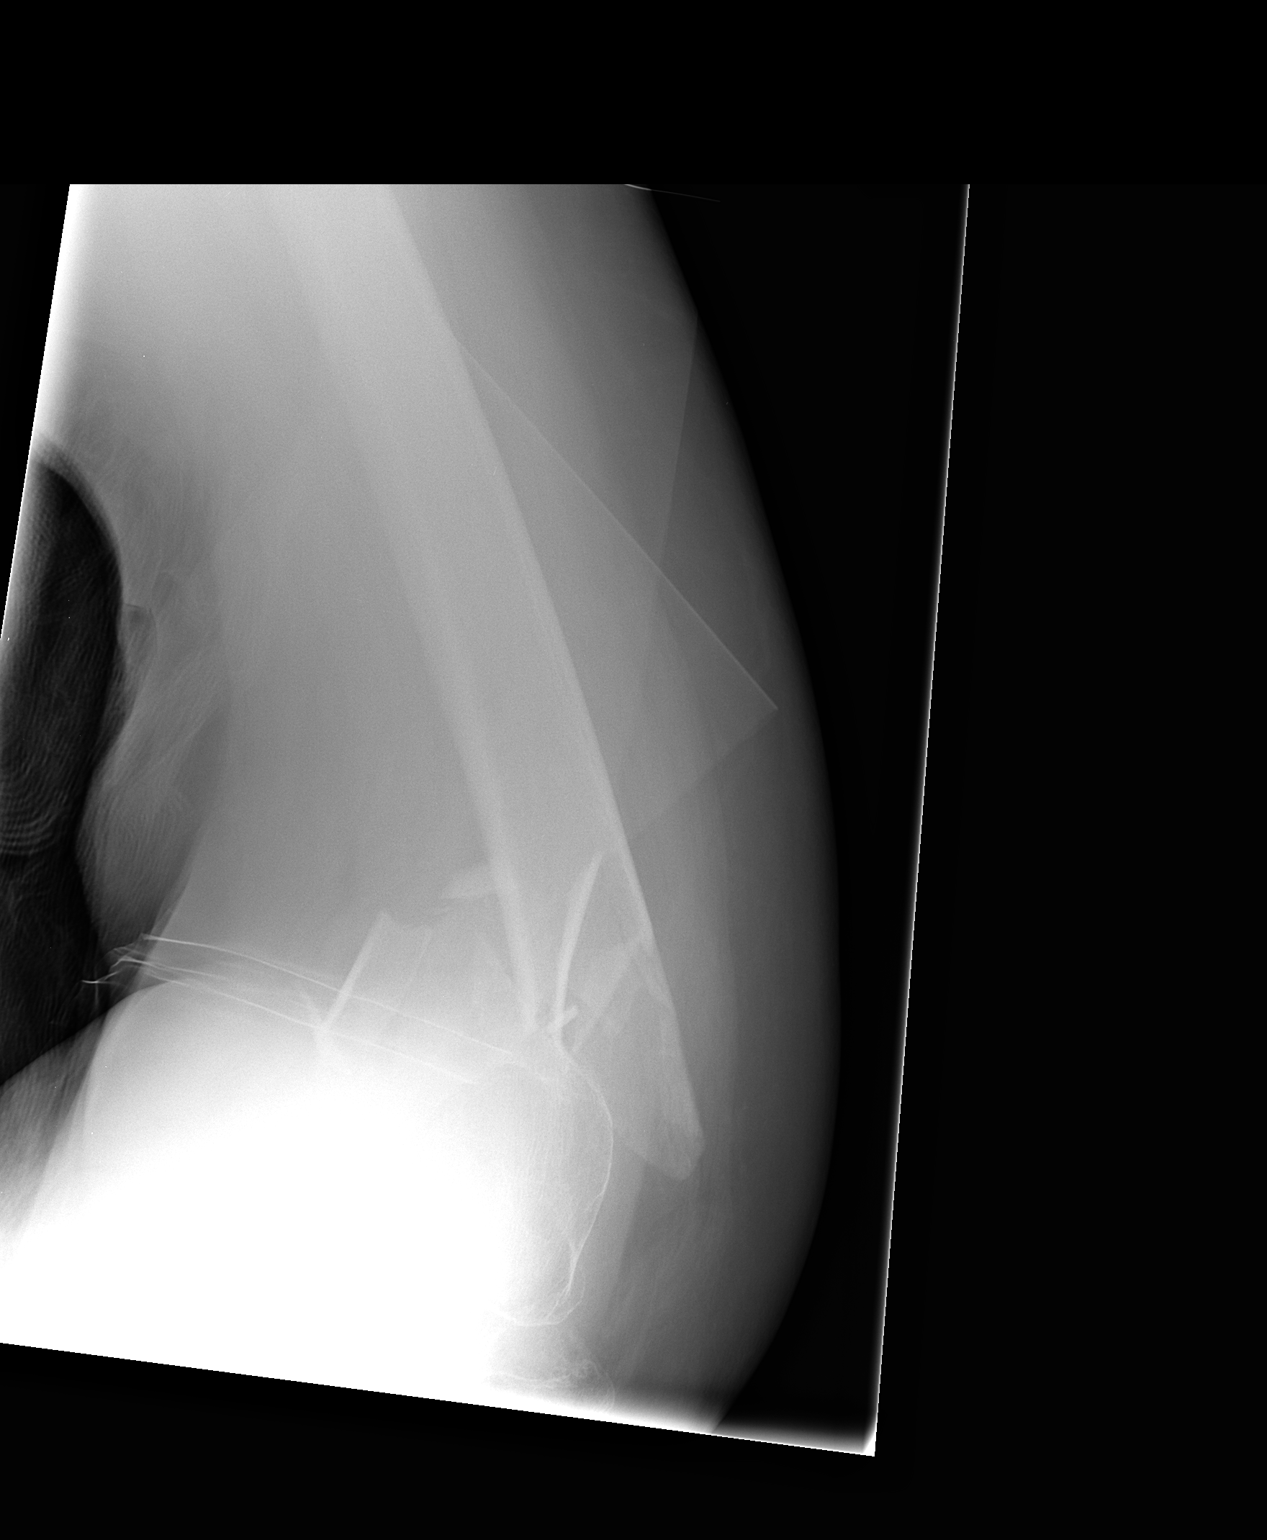

[2 of 2 positions shown; findings below may reference images not displayed]

FINDINGS: The proximal and mid femur are imaged.  The hip is in
normal alignment.  There is a comminuted displaced fracture the
distal femur.  No other femur fracture is identified.
IMPRESSION: Comminuted and displaced fracture of the distal femur.  See report
of the knee.

## 2012-05-31 HISTORY — PX: OTHER SURGICAL HISTORY: SHX169

## 2013-01-24 ENCOUNTER — Other Ambulatory Visit: Payer: Self-pay

## 2013-01-24 DIAGNOSIS — Z1231 Encounter for screening mammogram for malignant neoplasm of breast: Secondary | ICD-10-CM

## 2013-02-21 ENCOUNTER — Ambulatory Visit
Admission: RE | Admit: 2013-02-21 | Discharge: 2013-02-21 | Disposition: A | Discharge status: Home / Self Care | Payer: BC Managed Care – PPO | Source: Ambulatory Visit

## 2013-02-21 DIAGNOSIS — Z1231 Encounter for screening mammogram for malignant neoplasm of breast: Secondary | ICD-10-CM

## 2013-02-26 ENCOUNTER — Other Ambulatory Visit: Payer: Self-pay | Admitting: Geriatric Medicine

## 2013-02-26 DIAGNOSIS — R928 Other abnormal and inconclusive findings on diagnostic imaging of breast: Secondary | ICD-10-CM

## 2013-03-06 ENCOUNTER — Ambulatory Visit
Admission: RE | Admit: 2013-03-06 | Discharge: 2013-03-06 | Disposition: A | Discharge status: Home / Self Care | Payer: BC Managed Care – PPO | Source: Ambulatory Visit | Attending: Geriatric Medicine | Admitting: Geriatric Medicine

## 2013-03-06 DIAGNOSIS — R928 Other abnormal and inconclusive findings on diagnostic imaging of breast: Secondary | ICD-10-CM

## 2013-09-14 ENCOUNTER — Other Ambulatory Visit: Payer: Self-pay | Admitting: Geriatric Medicine

## 2013-09-14 DIAGNOSIS — R109 Unspecified abdominal pain: Secondary | ICD-10-CM

## 2013-09-18 ENCOUNTER — Ambulatory Visit
Admission: RE | Admit: 2013-09-18 | Discharge: 2013-09-18 | Disposition: A | Discharge status: Home / Self Care | Payer: BC Managed Care – PPO | Source: Ambulatory Visit | Attending: Geriatric Medicine | Admitting: Geriatric Medicine

## 2013-09-18 DIAGNOSIS — R109 Unspecified abdominal pain: Secondary | ICD-10-CM

## 2013-12-24 ENCOUNTER — Other Ambulatory Visit: Payer: Self-pay | Admitting: Geriatric Medicine

## 2013-12-24 ENCOUNTER — Ambulatory Visit
Admission: RE | Admit: 2013-12-24 | Discharge: 2013-12-24 | Disposition: A | Discharge status: Home / Self Care | Payer: BC Managed Care – PPO | Source: Ambulatory Visit | Attending: Geriatric Medicine | Admitting: Geriatric Medicine

## 2013-12-24 DIAGNOSIS — M542 Cervicalgia: Secondary | ICD-10-CM

## 2013-12-25 ENCOUNTER — Ambulatory Visit: Payer: BC Managed Care – PPO | Attending: Geriatric Medicine | Admitting: Physical Therapy

## 2013-12-25 DIAGNOSIS — M542 Cervicalgia: Secondary | ICD-10-CM | POA: Diagnosis not present

## 2013-12-25 DIAGNOSIS — IMO0001 Reserved for inherently not codable concepts without codable children: Secondary | ICD-10-CM | POA: Diagnosis present

## 2014-01-03 ENCOUNTER — Ambulatory Visit: Payer: BC Managed Care – PPO | Attending: Geriatric Medicine | Admitting: Physical Therapy

## 2014-01-03 DIAGNOSIS — IMO0001 Reserved for inherently not codable concepts without codable children: Secondary | ICD-10-CM | POA: Insufficient documentation

## 2014-01-03 DIAGNOSIS — M542 Cervicalgia: Secondary | ICD-10-CM | POA: Insufficient documentation

## 2014-01-08 ENCOUNTER — Ambulatory Visit: Payer: BC Managed Care – PPO | Admitting: Physical Therapy

## 2014-01-08 DIAGNOSIS — IMO0001 Reserved for inherently not codable concepts without codable children: Secondary | ICD-10-CM | POA: Diagnosis not present

## 2014-01-10 ENCOUNTER — Ambulatory Visit: Payer: BC Managed Care – PPO | Admitting: Physical Therapy

## 2014-01-10 DIAGNOSIS — IMO0001 Reserved for inherently not codable concepts without codable children: Secondary | ICD-10-CM | POA: Diagnosis not present

## 2014-01-15 ENCOUNTER — Encounter: Payer: BC Managed Care – PPO | Admitting: Physical Therapy

## 2014-01-17 ENCOUNTER — Ambulatory Visit: Payer: BC Managed Care – PPO | Admitting: Physical Therapy

## 2014-01-24 ENCOUNTER — Ambulatory Visit: Payer: BC Managed Care – PPO | Admitting: Physical Therapy

## 2014-01-24 DIAGNOSIS — IMO0001 Reserved for inherently not codable concepts without codable children: Secondary | ICD-10-CM | POA: Diagnosis not present

## 2014-02-01 ENCOUNTER — Other Ambulatory Visit: Payer: Self-pay

## 2014-02-01 DIAGNOSIS — Z1231 Encounter for screening mammogram for malignant neoplasm of breast: Secondary | ICD-10-CM

## 2014-02-06 ENCOUNTER — Encounter: Payer: BC Managed Care – PPO | Admitting: Rehabilitation

## 2014-02-13 ENCOUNTER — Encounter: Payer: BC Managed Care – PPO | Admitting: Rehabilitation

## 2014-02-24 ENCOUNTER — Encounter: Payer: Self-pay | Admitting: *Deleted

## 2014-02-25 ENCOUNTER — Ambulatory Visit
Admission: RE | Admit: 2014-02-25 | Discharge: 2014-02-25 | Disposition: A | Discharge status: Home / Self Care | Payer: BC Managed Care – PPO | Source: Ambulatory Visit

## 2014-02-25 DIAGNOSIS — Z1231 Encounter for screening mammogram for malignant neoplasm of breast: Secondary | ICD-10-CM

## 2014-03-28 ENCOUNTER — Ambulatory Visit (HOSPITAL_COMMUNITY): Payer: BC Managed Care – PPO | Attending: Geriatric Medicine

## 2014-03-28 ENCOUNTER — Other Ambulatory Visit (HOSPITAL_COMMUNITY): Payer: Self-pay | Admitting: Geriatric Medicine

## 2014-03-28 DIAGNOSIS — I350 Nonrheumatic aortic (valve) stenosis: Secondary | ICD-10-CM | POA: Insufficient documentation

## 2014-03-28 DIAGNOSIS — E119 Type 2 diabetes mellitus without complications: Secondary | ICD-10-CM | POA: Diagnosis not present

## 2014-03-28 DIAGNOSIS — E785 Hyperlipidemia, unspecified: Secondary | ICD-10-CM | POA: Insufficient documentation

## 2014-03-28 DIAGNOSIS — I10 Essential (primary) hypertension: Secondary | ICD-10-CM | POA: Insufficient documentation

## 2014-03-28 NOTE — Progress Notes (Signed)
2D Echo completed. 03/28/2014

## 2014-08-07 ENCOUNTER — Ambulatory Visit (INDEPENDENT_AMBULATORY_CARE_PROVIDER_SITE_OTHER): Payer: BC Managed Care – PPO | Admitting: Podiatry

## 2014-08-07 ENCOUNTER — Encounter: Payer: Self-pay | Admitting: Podiatry

## 2014-08-07 VITALS — BP 159/93 | HR 77 | Resp 16

## 2014-08-07 DIAGNOSIS — E1142 Type 2 diabetes mellitus with diabetic polyneuropathy: Secondary | ICD-10-CM

## 2014-08-07 DIAGNOSIS — B351 Tinea unguium: Secondary | ICD-10-CM

## 2014-08-07 DIAGNOSIS — M79673 Pain in unspecified foot: Secondary | ICD-10-CM | POA: Diagnosis not present

## 2014-08-07 DIAGNOSIS — M2042 Other hammer toe(s) (acquired), left foot: Secondary | ICD-10-CM | POA: Diagnosis not present

## 2014-08-07 DIAGNOSIS — Q828 Other specified congenital malformations of skin: Secondary | ICD-10-CM

## 2014-08-07 NOTE — Progress Notes (Signed)
   Subjective:    Patient ID: Beth Malone, female    DOB: 1945-07-13, 69 y.o.   MRN: [id number]  HPI Pt presents with 4th left toenail pain, needs nail debridement   Review of Systems  Cardiovascular: Positive for leg swelling.  All other systems reviewed and are negative.      Objective:   Physical Exam        Assessment & Plan:

## 2014-08-09 NOTE — Progress Notes (Signed)
Subjective:     Patient ID: Beth Malone, female   DOB: [birth year], 69 y.o.   MRN: [id number]  HPI patient presents with lesion formation fourth toe left and nail disease 1-5 of both feet that make it hard for her to wear shoe gear comfortably. She is a long-term diabetic and does have moderate diminishment of her pulses and neurological status   Review of Systems  All other systems reviewed and are negative.      Objective:   Physical Exam  Constitutional: She is oriented to person, place, and time.  Cardiovascular: Intact distal pulses.   Musculoskeletal: Normal range of motion.  Neurological: She is oriented to person, place, and time.  Skin: Skin is warm and dry.  Nursing note and vitals reviewed.  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

## 2014-09-11 ENCOUNTER — Encounter: Payer: Self-pay | Admitting: Podiatry

## 2014-09-11 ENCOUNTER — Ambulatory Visit (INDEPENDENT_AMBULATORY_CARE_PROVIDER_SITE_OTHER): Payer: BC Managed Care – PPO | Admitting: Podiatry

## 2014-09-11 ENCOUNTER — Ambulatory Visit (INDEPENDENT_AMBULATORY_CARE_PROVIDER_SITE_OTHER): Payer: BC Managed Care – PPO

## 2014-09-11 DIAGNOSIS — M79672 Pain in left foot: Secondary | ICD-10-CM | POA: Diagnosis not present

## 2014-09-11 DIAGNOSIS — M779 Enthesopathy, unspecified: Secondary | ICD-10-CM

## 2014-09-11 MED ORDER — TRIAMCINOLONE ACETONIDE 10 MG/ML IJ SUSP
10.0000 mg | Freq: Once | INTRAMUSCULAR | Status: AC
Start: 2014-09-11 — End: 2014-09-11
  Administered 2014-09-11: 10 mg

## 2014-09-12 NOTE — Progress Notes (Signed)
Subjective:     Patient ID: Beth Malone, female   DOB: Jul 22, 1945, 69 y.o.   MRN: [id number]  HPI patient presents stating I'm getting quite a bit of discomfort in this left dorsal foot make it hard for me to walk comfortably   Review of Systems     Objective:   Physical Exam Neurovascular status intact muscle strength adequate with inflammation second MPJ left with fluid buildup and pain with pressure    Assessment:     Inflammatory capsulitis second MPJ left    Plan:     H&P and condition discussed and at this time I did a proximal nerve block aspirated the joint was able to get out a small amount of clear fluid and injected with a quarter cc deck some Kenalog and applied thick plantar pad to reduce stress on the joint. Reappoint to recheck

## 2014-11-05 DIAGNOSIS — M79673 Pain in unspecified foot: Secondary | ICD-10-CM

## 2014-11-11 ENCOUNTER — Ambulatory Visit: Payer: BC Managed Care – PPO

## 2014-12-05 ENCOUNTER — Other Ambulatory Visit: Payer: Self-pay | Admitting: Geriatric Medicine

## 2014-12-05 ENCOUNTER — Ambulatory Visit
Admission: RE | Admit: 2014-12-05 | Discharge: 2014-12-05 | Disposition: A | Discharge status: Home / Self Care | Payer: BC Managed Care – PPO | Source: Ambulatory Visit | Attending: Geriatric Medicine | Admitting: Geriatric Medicine

## 2014-12-05 DIAGNOSIS — R059 Cough, unspecified: Secondary | ICD-10-CM

## 2014-12-05 DIAGNOSIS — R05 Cough: Secondary | ICD-10-CM

## 2014-12-17 ENCOUNTER — Ambulatory Visit: Payer: BC Managed Care – PPO | Admitting: Podiatry

## 2014-12-31 ENCOUNTER — Ambulatory Visit: Payer: BC Managed Care – PPO | Admitting: Podiatry

## 2015-04-16 ENCOUNTER — Other Ambulatory Visit: Payer: Self-pay

## 2015-04-16 DIAGNOSIS — Z1231 Encounter for screening mammogram for malignant neoplasm of breast: Secondary | ICD-10-CM

## 2015-04-21 DIAGNOSIS — M25562 Pain in left knee: Secondary | ICD-10-CM | POA: Diagnosis not present

## 2015-04-21 DIAGNOSIS — M25561 Pain in right knee: Secondary | ICD-10-CM | POA: Diagnosis not present

## 2015-04-21 DIAGNOSIS — M170 Bilateral primary osteoarthritis of knee: Secondary | ICD-10-CM | POA: Diagnosis not present

## 2015-05-08 ENCOUNTER — Ambulatory Visit
Admission: RE | Admit: 2015-05-08 | Discharge: 2015-05-08 | Disposition: A | Discharge status: Home / Self Care | Payer: Medicare Other | Source: Ambulatory Visit

## 2015-05-08 ENCOUNTER — Other Ambulatory Visit: Payer: Self-pay

## 2015-05-08 DIAGNOSIS — Z1231 Encounter for screening mammogram for malignant neoplasm of breast: Secondary | ICD-10-CM | POA: Diagnosis not present

## 2015-05-08 DIAGNOSIS — M170 Bilateral primary osteoarthritis of knee: Secondary | ICD-10-CM | POA: Diagnosis not present

## 2015-05-08 DIAGNOSIS — M25562 Pain in left knee: Secondary | ICD-10-CM | POA: Diagnosis not present

## 2015-05-08 DIAGNOSIS — R2689 Other abnormalities of gait and mobility: Secondary | ICD-10-CM | POA: Diagnosis not present

## 2015-05-08 DIAGNOSIS — R262 Difficulty in walking, not elsewhere classified: Secondary | ICD-10-CM | POA: Diagnosis not present

## 2015-05-08 DIAGNOSIS — M1712 Unilateral primary osteoarthritis, left knee: Secondary | ICD-10-CM | POA: Diagnosis not present

## 2015-05-14 ENCOUNTER — Other Ambulatory Visit: Payer: Self-pay | Admitting: Nurse Practitioner

## 2015-05-14 DIAGNOSIS — M1712 Unilateral primary osteoarthritis, left knee: Secondary | ICD-10-CM | POA: Diagnosis not present

## 2015-05-14 DIAGNOSIS — M170 Bilateral primary osteoarthritis of knee: Secondary | ICD-10-CM | POA: Diagnosis not present

## 2015-05-14 DIAGNOSIS — R1011 Right upper quadrant pain: Secondary | ICD-10-CM | POA: Diagnosis not present

## 2015-05-14 DIAGNOSIS — R1084 Generalized abdominal pain: Secondary | ICD-10-CM

## 2015-05-14 DIAGNOSIS — R109 Unspecified abdominal pain: Secondary | ICD-10-CM | POA: Diagnosis not present

## 2015-05-14 DIAGNOSIS — M25562 Pain in left knee: Secondary | ICD-10-CM | POA: Diagnosis not present

## 2015-05-14 DIAGNOSIS — R2689 Other abnormalities of gait and mobility: Secondary | ICD-10-CM | POA: Diagnosis not present

## 2015-05-21 ENCOUNTER — Ambulatory Visit
Admission: RE | Admit: 2015-05-21 | Discharge: 2015-05-21 | Disposition: A | Discharge status: Home / Self Care | Payer: Medicare Other | Source: Ambulatory Visit | Attending: Nurse Practitioner | Admitting: Nurse Practitioner

## 2015-05-21 DIAGNOSIS — K8020 Calculus of gallbladder without cholecystitis without obstruction: Secondary | ICD-10-CM | POA: Diagnosis not present

## 2015-05-21 DIAGNOSIS — R2689 Other abnormalities of gait and mobility: Secondary | ICD-10-CM | POA: Diagnosis not present

## 2015-05-21 DIAGNOSIS — R1084 Generalized abdominal pain: Secondary | ICD-10-CM

## 2015-05-21 DIAGNOSIS — M1712 Unilateral primary osteoarthritis, left knee: Secondary | ICD-10-CM | POA: Diagnosis not present

## 2015-05-21 DIAGNOSIS — M25562 Pain in left knee: Secondary | ICD-10-CM | POA: Diagnosis not present

## 2015-05-28 DIAGNOSIS — M25562 Pain in left knee: Secondary | ICD-10-CM | POA: Diagnosis not present

## 2015-05-28 DIAGNOSIS — R2689 Other abnormalities of gait and mobility: Secondary | ICD-10-CM | POA: Diagnosis not present

## 2015-05-28 DIAGNOSIS — M1712 Unilateral primary osteoarthritis, left knee: Secondary | ICD-10-CM | POA: Diagnosis not present

## 2015-06-04 DIAGNOSIS — M1712 Unilateral primary osteoarthritis, left knee: Secondary | ICD-10-CM | POA: Diagnosis not present

## 2015-06-04 DIAGNOSIS — M25562 Pain in left knee: Secondary | ICD-10-CM | POA: Diagnosis not present

## 2015-06-04 DIAGNOSIS — R2689 Other abnormalities of gait and mobility: Secondary | ICD-10-CM | POA: Diagnosis not present

## 2015-06-17 DIAGNOSIS — M25562 Pain in left knee: Secondary | ICD-10-CM | POA: Diagnosis not present

## 2015-06-17 DIAGNOSIS — R2689 Other abnormalities of gait and mobility: Secondary | ICD-10-CM | POA: Diagnosis not present

## 2015-06-17 DIAGNOSIS — H6981 Other specified disorders of Eustachian tube, right ear: Secondary | ICD-10-CM | POA: Diagnosis not present

## 2015-06-17 DIAGNOSIS — M545 Low back pain: Secondary | ICD-10-CM | POA: Diagnosis not present

## 2015-06-17 DIAGNOSIS — M1712 Unilateral primary osteoarthritis, left knee: Secondary | ICD-10-CM | POA: Diagnosis not present

## 2015-06-26 DIAGNOSIS — M1712 Unilateral primary osteoarthritis, left knee: Secondary | ICD-10-CM | POA: Diagnosis not present

## 2015-06-26 DIAGNOSIS — R2689 Other abnormalities of gait and mobility: Secondary | ICD-10-CM | POA: Diagnosis not present

## 2015-06-26 DIAGNOSIS — M25562 Pain in left knee: Secondary | ICD-10-CM | POA: Diagnosis not present

## 2015-07-02 DIAGNOSIS — M1712 Unilateral primary osteoarthritis, left knee: Secondary | ICD-10-CM | POA: Diagnosis not present

## 2015-07-02 DIAGNOSIS — M25562 Pain in left knee: Secondary | ICD-10-CM | POA: Diagnosis not present

## 2015-07-02 DIAGNOSIS — R2689 Other abnormalities of gait and mobility: Secondary | ICD-10-CM | POA: Diagnosis not present

## 2015-07-09 DIAGNOSIS — M1712 Unilateral primary osteoarthritis, left knee: Secondary | ICD-10-CM | POA: Diagnosis not present

## 2015-07-09 DIAGNOSIS — M25562 Pain in left knee: Secondary | ICD-10-CM | POA: Diagnosis not present

## 2015-07-09 DIAGNOSIS — R2689 Other abnormalities of gait and mobility: Secondary | ICD-10-CM | POA: Diagnosis not present

## 2015-07-16 DIAGNOSIS — M25562 Pain in left knee: Secondary | ICD-10-CM | POA: Diagnosis not present

## 2015-07-16 DIAGNOSIS — R2689 Other abnormalities of gait and mobility: Secondary | ICD-10-CM | POA: Diagnosis not present

## 2015-07-16 DIAGNOSIS — M1712 Unilateral primary osteoarthritis, left knee: Secondary | ICD-10-CM | POA: Diagnosis not present

## 2015-07-24 DIAGNOSIS — H52222 Regular astigmatism, left eye: Secondary | ICD-10-CM | POA: Diagnosis not present

## 2015-07-24 DIAGNOSIS — H524 Presbyopia: Secondary | ICD-10-CM | POA: Diagnosis not present

## 2015-07-24 DIAGNOSIS — H5203 Hypermetropia, bilateral: Secondary | ICD-10-CM | POA: Diagnosis not present

## 2015-07-24 DIAGNOSIS — D3131 Benign neoplasm of right choroid: Secondary | ICD-10-CM | POA: Diagnosis not present

## 2015-07-30 ENCOUNTER — Ambulatory Visit (INDEPENDENT_AMBULATORY_CARE_PROVIDER_SITE_OTHER): Payer: Medicare Other | Admitting: Podiatry

## 2015-07-30 ENCOUNTER — Encounter: Payer: Self-pay | Admitting: Podiatry

## 2015-07-30 DIAGNOSIS — M79674 Pain in right toe(s): Secondary | ICD-10-CM | POA: Diagnosis not present

## 2015-07-30 DIAGNOSIS — B351 Tinea unguium: Secondary | ICD-10-CM | POA: Diagnosis not present

## 2015-07-30 DIAGNOSIS — M79675 Pain in left toe(s): Secondary | ICD-10-CM

## 2015-07-30 NOTE — Progress Notes (Signed)
   Subjective:    Patient ID: Beth Malone, female    DOB: December 29, 1945, 70 y.o.   MRN: [id number]  HPI This patient presents today concerned about a gradual increase in discomfort in her elongated, toenails which are cough walking wearing shoes over the past 11 months. Patient has attempted to trim the nails herself, however, is not able to get adequate relief from self treatment. Patient has history of nail debridement in 2016 by Dr. Paulla Dolly  Patient is a diabetic 5 years without any history of foot ulceration, claudication or amputation  Review of Systems  HENT: Positive for sinus pressure.   All other systems reviewed and are negative.      Objective:   Physical Exam  Maintain 3  Vascular: Peripheral edema of pitting left ankle greater than right No calf edema or calf tenderness bilaterally DP and PT pulses 2/4 bilaterally PT pulses 2/4 bilaterally Capillary reflex immediate bilaterally  Neurological: Sensation to 10 g monofilament wire intact 5/5 bilaterally Vibratory sensation reactive bilaterally Ankle reflex equal and reactive bilaterally  Dermatological: Absent third right toenail The remaining toenails are incurvated, elongated, deformed, discolored and tender to direct palpation 6-10  Musculoskeletal: Crossover second right toe Transfers medial drifting toes 2-5 right       Assessment & Plan:   Assessment: Satisfactory neurovascular status Symptomatic mycotic toenails 9  Plan: Debridement toenails 6-10 mechanically and electronically without any bleeding  Reappoint when necessary at patient's request right 4 month intervals

## 2015-07-30 NOTE — Patient Instructions (Signed)
Diabetes and Foot Care Diabetes may cause you to have problems because of poor blood supply (circulation) to your feet and legs. This may cause the skin on your feet to become thinner, break easier, and heal more slowly. Your skin may become dry, and the skin may peel and crack. You may also have nerve damage in your legs and feet causing decreased feeling in them. You may not notice minor injuries to your feet that could lead to infections or more serious problems. Taking care of your feet is one of the most important things you can do for yourself.  HOME CARE INSTRUCTIONS  Wear shoes at all times, even in the house. Do not go barefoot. Bare feet are easily injured.  Check your feet daily for blisters, cuts, and redness. If you cannot see the bottom of your feet, use a mirror or ask someone for help.  Wash your feet with warm water (do not use hot water) and mild soap. Then pat your feet and the areas between your toes until they are completely dry. Do not soak your feet as this can dry your skin.  Apply a moisturizing lotion or petroleum jelly (that does not contain alcohol and is unscented) to the skin on your feet and to dry, brittle toenails. Do not apply lotion between your toes.  Trim your toenails straight across. Do not dig under them or around the cuticle. File the edges of your nails with an emery board or nail file.  Do not cut corns or calluses or try to remove them with medicine.  Wear clean socks or stockings every day. Make sure they are not too tight. Do not wear knee-high stockings since they may decrease blood flow to your legs.  Wear shoes that fit properly and have enough cushioning. To break in new shoes, wear them for just a few hours a day. This prevents you from injuring your feet. Always look in your shoes before you put them on to be sure there are no objects inside.  Do not cross your legs. This may decrease the blood flow to your feet.  If you find a minor scrape,  cut, or break in the skin on your feet, keep it and the skin around it clean and dry. These areas may be cleansed with mild soap and water. Do not cleanse the area with peroxide, alcohol, or iodine.  When you remove an adhesive bandage, be sure not to damage the skin around it.  If you have a wound, look at it several times a day to make sure it is healing.  Do not use heating pads or hot water bottles. They may burn your skin. If you have lost feeling in your feet or legs, you may not know it is happening until it is too late.  Make sure your health care provider performs a complete foot exam at least annually or more often if you have foot problems. Report any cuts, sores, or bruises to your health care provider immediately. SEEK MEDICAL CARE IF:   You have an injury that is not healing.  You have cuts or breaks in the skin.  You have an ingrown nail.  You notice redness on your legs or feet.  You feel burning or tingling in your legs or feet.  You have pain or cramps in your legs and feet.  Your legs or feet are numb.  Your feet always feel cold. SEEK IMMEDIATE MEDICAL CARE IF:   There is increasing redness,   swelling, or pain in or around a wound.  There is a red line that goes up your leg.  Pus is coming from a wound.  You develop a fever or as directed by your health care provider.  You notice a bad smell coming from an ulcer or wound.   This information is not intended to replace advice given to you by your health care provider. Make sure you discuss any questions you have with your health care provider.   Document Released: 05/14/2000 Document Revised: 01/17/2013 Document Reviewed: 10/24/2012 Elsevier Interactive Patient Education 2016 [employer]  

## 2015-10-09 ENCOUNTER — Ambulatory Visit (INDEPENDENT_AMBULATORY_CARE_PROVIDER_SITE_OTHER): Payer: Medicare Other

## 2015-10-09 ENCOUNTER — Ambulatory Visit (INDEPENDENT_AMBULATORY_CARE_PROVIDER_SITE_OTHER): Payer: Medicare Other | Admitting: Podiatry

## 2015-10-09 ENCOUNTER — Ambulatory Visit: Payer: Medicare Other

## 2015-10-09 ENCOUNTER — Encounter: Payer: Self-pay | Admitting: Podiatry

## 2015-10-09 VITALS — BP 177/101 | HR 87 | Resp 16

## 2015-10-09 DIAGNOSIS — M7751 Other enthesopathy of right foot: Secondary | ICD-10-CM

## 2015-10-09 DIAGNOSIS — M7752 Other enthesopathy of left foot: Secondary | ICD-10-CM | POA: Diagnosis not present

## 2015-10-09 DIAGNOSIS — M778 Other enthesopathies, not elsewhere classified: Secondary | ICD-10-CM

## 2015-10-09 DIAGNOSIS — M2041 Other hammer toe(s) (acquired), right foot: Secondary | ICD-10-CM

## 2015-10-09 DIAGNOSIS — M779 Enthesopathy, unspecified: Principal | ICD-10-CM

## 2015-10-09 DIAGNOSIS — M79671 Pain in right foot: Secondary | ICD-10-CM

## 2015-10-09 MED ORDER — TRIAMCINOLONE ACETONIDE 10 MG/ML IJ SUSP
10.0000 mg | Freq: Once | INTRAMUSCULAR | Status: AC
  Administered 2015-10-09: 10 mg

## 2015-10-09 NOTE — Progress Notes (Signed)
Subjective:     Patient ID: Beth Malone, female   DOB: March 30, 1946, 70 y.o.   MRN: [id number]  HPI patient presents stating she has developed pain again in the right foot in the area that we worked on around a year ago. States he did real well for year   Review of Systems     Objective:   Physical Exam Neurovascular status intact muscle strength adequate range of motion within normal limits with discomfort in the right third metatarsophalangeal joint with fluid around the joint surface that's localized in nature    Assessment:     Inflammatory capsulitis right third MPJ    Plan:     Proximal nerve block administered I then went ahead and aspirated the third MPJ getting out of small amount of clear fluid and injected with a quarter cc dexamethasone Kenalog and applied thick pad to reduce pressure on the joint surface. Reappoint to recheck  Tray was negative for signs of stress fracture or arthritis of the joint

## 2015-10-15 DIAGNOSIS — M1712 Unilateral primary osteoarthritis, left knee: Secondary | ICD-10-CM | POA: Diagnosis not present

## 2015-10-15 DIAGNOSIS — M25562 Pain in left knee: Secondary | ICD-10-CM | POA: Diagnosis not present

## 2015-10-31 DIAGNOSIS — Z0000 Encounter for general adult medical examination without abnormal findings: Secondary | ICD-10-CM | POA: Diagnosis not present

## 2015-10-31 DIAGNOSIS — Z79899 Other long term (current) drug therapy: Secondary | ICD-10-CM | POA: Diagnosis not present

## 2015-10-31 DIAGNOSIS — Z6833 Body mass index (BMI) 33.0-33.9, adult: Secondary | ICD-10-CM | POA: Diagnosis not present

## 2015-10-31 DIAGNOSIS — E7800 Pure hypercholesterolemia, unspecified: Secondary | ICD-10-CM | POA: Diagnosis not present

## 2015-10-31 DIAGNOSIS — I129 Hypertensive chronic kidney disease with stage 1 through stage 4 chronic kidney disease, or unspecified chronic kidney disease: Secondary | ICD-10-CM | POA: Diagnosis not present

## 2015-10-31 DIAGNOSIS — E119 Type 2 diabetes mellitus without complications: Secondary | ICD-10-CM | POA: Diagnosis not present

## 2015-10-31 DIAGNOSIS — M8588 Other specified disorders of bone density and structure, other site: Secondary | ICD-10-CM | POA: Diagnosis not present

## 2015-10-31 DIAGNOSIS — Z1389 Encounter for screening for other disorder: Secondary | ICD-10-CM | POA: Diagnosis not present

## 2015-10-31 DIAGNOSIS — E1121 Type 2 diabetes mellitus with diabetic nephropathy: Secondary | ICD-10-CM | POA: Diagnosis not present

## 2015-10-31 DIAGNOSIS — I10 Essential (primary) hypertension: Secondary | ICD-10-CM | POA: Diagnosis not present

## 2015-10-31 DIAGNOSIS — Z7984 Long term (current) use of oral hypoglycemic drugs: Secondary | ICD-10-CM | POA: Diagnosis not present

## 2015-11-12 DIAGNOSIS — D225 Melanocytic nevi of trunk: Secondary | ICD-10-CM | POA: Diagnosis not present

## 2015-11-12 DIAGNOSIS — Z85828 Personal history of other malignant neoplasm of skin: Secondary | ICD-10-CM | POA: Diagnosis not present

## 2015-11-12 DIAGNOSIS — L814 Other melanin hyperpigmentation: Secondary | ICD-10-CM | POA: Diagnosis not present

## 2015-11-12 DIAGNOSIS — L821 Other seborrheic keratosis: Secondary | ICD-10-CM | POA: Diagnosis not present

## 2015-11-12 DIAGNOSIS — L280 Lichen simplex chronicus: Secondary | ICD-10-CM | POA: Diagnosis not present

## 2015-11-12 DIAGNOSIS — D2262 Melanocytic nevi of left upper limb, including shoulder: Secondary | ICD-10-CM | POA: Diagnosis not present

## 2015-11-12 DIAGNOSIS — D2261 Melanocytic nevi of right upper limb, including shoulder: Secondary | ICD-10-CM | POA: Diagnosis not present

## 2015-11-26 DIAGNOSIS — M25562 Pain in left knee: Secondary | ICD-10-CM | POA: Diagnosis not present

## 2015-11-26 DIAGNOSIS — M8588 Other specified disorders of bone density and structure, other site: Secondary | ICD-10-CM | POA: Diagnosis not present

## 2015-11-26 DIAGNOSIS — M1712 Unilateral primary osteoarthritis, left knee: Secondary | ICD-10-CM | POA: Diagnosis not present

## 2015-11-26 DIAGNOSIS — M25569 Pain in unspecified knee: Secondary | ICD-10-CM | POA: Diagnosis not present

## 2015-12-03 DIAGNOSIS — M25562 Pain in left knee: Secondary | ICD-10-CM | POA: Diagnosis not present

## 2015-12-03 DIAGNOSIS — M1712 Unilateral primary osteoarthritis, left knee: Secondary | ICD-10-CM | POA: Diagnosis not present

## 2015-12-09 DIAGNOSIS — M1712 Unilateral primary osteoarthritis, left knee: Secondary | ICD-10-CM | POA: Diagnosis not present

## 2015-12-09 DIAGNOSIS — M25562 Pain in left knee: Secondary | ICD-10-CM | POA: Diagnosis not present

## 2015-12-24 DIAGNOSIS — M1712 Unilateral primary osteoarthritis, left knee: Secondary | ICD-10-CM | POA: Diagnosis not present

## 2015-12-24 DIAGNOSIS — M25562 Pain in left knee: Secondary | ICD-10-CM | POA: Diagnosis not present

## 2016-01-02 DIAGNOSIS — H6521 Chronic serous otitis media, right ear: Secondary | ICD-10-CM | POA: Diagnosis not present

## 2016-01-02 DIAGNOSIS — J328 Other chronic sinusitis: Secondary | ICD-10-CM | POA: Diagnosis not present

## 2016-01-30 DIAGNOSIS — Z79899 Other long term (current) drug therapy: Secondary | ICD-10-CM | POA: Diagnosis not present

## 2016-01-30 DIAGNOSIS — E7800 Pure hypercholesterolemia, unspecified: Secondary | ICD-10-CM | POA: Diagnosis not present

## 2016-02-04 DIAGNOSIS — M25551 Pain in right hip: Secondary | ICD-10-CM | POA: Diagnosis not present

## 2016-02-04 DIAGNOSIS — I129 Hypertensive chronic kidney disease with stage 1 through stage 4 chronic kidney disease, or unspecified chronic kidney disease: Secondary | ICD-10-CM | POA: Diagnosis not present

## 2016-02-04 DIAGNOSIS — N183 Chronic kidney disease, stage 3 (moderate): Secondary | ICD-10-CM | POA: Diagnosis not present

## 2016-02-06 DIAGNOSIS — M7061 Trochanteric bursitis, right hip: Secondary | ICD-10-CM | POA: Diagnosis not present

## 2016-02-16 DIAGNOSIS — R2689 Other abnormalities of gait and mobility: Secondary | ICD-10-CM | POA: Diagnosis not present

## 2016-02-16 DIAGNOSIS — M25551 Pain in right hip: Secondary | ICD-10-CM | POA: Diagnosis not present

## 2016-02-16 DIAGNOSIS — M6281 Muscle weakness (generalized): Secondary | ICD-10-CM | POA: Diagnosis not present

## 2016-02-19 DIAGNOSIS — M6281 Muscle weakness (generalized): Secondary | ICD-10-CM | POA: Diagnosis not present

## 2016-02-19 DIAGNOSIS — M25551 Pain in right hip: Secondary | ICD-10-CM | POA: Diagnosis not present

## 2016-02-19 DIAGNOSIS — R2689 Other abnormalities of gait and mobility: Secondary | ICD-10-CM | POA: Diagnosis not present

## 2016-02-23 DIAGNOSIS — M6281 Muscle weakness (generalized): Secondary | ICD-10-CM | POA: Diagnosis not present

## 2016-02-23 DIAGNOSIS — M25551 Pain in right hip: Secondary | ICD-10-CM | POA: Diagnosis not present

## 2016-02-23 DIAGNOSIS — R2689 Other abnormalities of gait and mobility: Secondary | ICD-10-CM | POA: Diagnosis not present

## 2016-02-25 DIAGNOSIS — M25551 Pain in right hip: Secondary | ICD-10-CM | POA: Diagnosis not present

## 2016-02-25 DIAGNOSIS — R2689 Other abnormalities of gait and mobility: Secondary | ICD-10-CM | POA: Diagnosis not present

## 2016-02-25 DIAGNOSIS — M6281 Muscle weakness (generalized): Secondary | ICD-10-CM | POA: Diagnosis not present

## 2016-03-02 DIAGNOSIS — R2689 Other abnormalities of gait and mobility: Secondary | ICD-10-CM | POA: Diagnosis not present

## 2016-03-02 DIAGNOSIS — M6281 Muscle weakness (generalized): Secondary | ICD-10-CM | POA: Diagnosis not present

## 2016-03-02 DIAGNOSIS — M25551 Pain in right hip: Secondary | ICD-10-CM | POA: Diagnosis not present

## 2016-03-03 ENCOUNTER — Other Ambulatory Visit: Payer: Self-pay | Admitting: Gastroenterology

## 2016-03-03 DIAGNOSIS — R1314 Dysphagia, pharyngoesophageal phase: Secondary | ICD-10-CM

## 2016-03-04 DIAGNOSIS — M25551 Pain in right hip: Secondary | ICD-10-CM | POA: Diagnosis not present

## 2016-03-04 DIAGNOSIS — R2689 Other abnormalities of gait and mobility: Secondary | ICD-10-CM | POA: Diagnosis not present

## 2016-03-04 DIAGNOSIS — M6281 Muscle weakness (generalized): Secondary | ICD-10-CM | POA: Diagnosis not present

## 2016-03-05 ENCOUNTER — Ambulatory Visit
Admission: RE | Admit: 2016-03-05 | Discharge: 2016-03-05 | Disposition: A | Discharge status: Home / Self Care | Payer: Medicare Other | Source: Ambulatory Visit | Attending: Gastroenterology | Admitting: Gastroenterology

## 2016-03-05 DIAGNOSIS — R1314 Dysphagia, pharyngoesophageal phase: Secondary | ICD-10-CM

## 2016-03-05 DIAGNOSIS — K224 Dyskinesia of esophagus: Secondary | ICD-10-CM | POA: Diagnosis not present

## 2016-03-05 DIAGNOSIS — K449 Diaphragmatic hernia without obstruction or gangrene: Secondary | ICD-10-CM | POA: Diagnosis not present

## 2016-03-09 DIAGNOSIS — R2689 Other abnormalities of gait and mobility: Secondary | ICD-10-CM | POA: Diagnosis not present

## 2016-03-09 DIAGNOSIS — M25551 Pain in right hip: Secondary | ICD-10-CM | POA: Diagnosis not present

## 2016-03-09 DIAGNOSIS — M6281 Muscle weakness (generalized): Secondary | ICD-10-CM | POA: Diagnosis not present

## 2016-03-11 DIAGNOSIS — M25551 Pain in right hip: Secondary | ICD-10-CM | POA: Diagnosis not present

## 2016-03-11 DIAGNOSIS — R2689 Other abnormalities of gait and mobility: Secondary | ICD-10-CM | POA: Diagnosis not present

## 2016-03-11 DIAGNOSIS — M6281 Muscle weakness (generalized): Secondary | ICD-10-CM | POA: Diagnosis not present

## 2016-03-12 ENCOUNTER — Other Ambulatory Visit: Payer: Self-pay | Admitting: Gastroenterology

## 2016-03-15 ENCOUNTER — Encounter (HOSPITAL_COMMUNITY): Payer: Self-pay | Admitting: *Deleted

## 2016-03-16 DIAGNOSIS — M7061 Trochanteric bursitis, right hip: Secondary | ICD-10-CM | POA: Diagnosis not present

## 2016-03-16 DIAGNOSIS — R2689 Other abnormalities of gait and mobility: Secondary | ICD-10-CM | POA: Diagnosis not present

## 2016-03-16 DIAGNOSIS — M6281 Muscle weakness (generalized): Secondary | ICD-10-CM | POA: Diagnosis not present

## 2016-03-16 DIAGNOSIS — M25551 Pain in right hip: Secondary | ICD-10-CM | POA: Diagnosis not present

## 2016-03-18 DIAGNOSIS — M25551 Pain in right hip: Secondary | ICD-10-CM | POA: Diagnosis not present

## 2016-03-18 DIAGNOSIS — M6281 Muscle weakness (generalized): Secondary | ICD-10-CM | POA: Diagnosis not present

## 2016-03-18 DIAGNOSIS — R2689 Other abnormalities of gait and mobility: Secondary | ICD-10-CM | POA: Diagnosis not present

## 2016-03-22 ENCOUNTER — Ambulatory Visit (HOSPITAL_COMMUNITY)
Admission: RE | Admit: 2016-03-22 | Discharge: 2016-03-22 | Disposition: A | Discharge status: Home / Self Care | Payer: Medicare Other | Source: Ambulatory Visit | Attending: Gastroenterology | Admitting: Gastroenterology

## 2016-03-22 ENCOUNTER — Encounter (HOSPITAL_COMMUNITY)
Admission: RE | Disposition: A | Discharge status: Home / Self Care | Payer: Self-pay | Source: Ambulatory Visit | Attending: Gastroenterology

## 2016-03-22 ENCOUNTER — Ambulatory Visit (HOSPITAL_COMMUNITY): Payer: Medicare Other | Admitting: Registered Nurse

## 2016-03-22 ENCOUNTER — Encounter (HOSPITAL_COMMUNITY): Payer: Self-pay

## 2016-03-22 DIAGNOSIS — Z79899 Other long term (current) drug therapy: Secondary | ICD-10-CM | POA: Insufficient documentation

## 2016-03-22 DIAGNOSIS — Z7982 Long term (current) use of aspirin: Secondary | ICD-10-CM | POA: Insufficient documentation

## 2016-03-22 DIAGNOSIS — Z7984 Long term (current) use of oral hypoglycemic drugs: Secondary | ICD-10-CM | POA: Diagnosis not present

## 2016-03-22 DIAGNOSIS — Z856 Personal history of leukemia: Secondary | ICD-10-CM | POA: Insufficient documentation

## 2016-03-22 DIAGNOSIS — R1310 Dysphagia, unspecified: Secondary | ICD-10-CM | POA: Insufficient documentation

## 2016-03-22 DIAGNOSIS — I10 Essential (primary) hypertension: Secondary | ICD-10-CM | POA: Insufficient documentation

## 2016-03-22 DIAGNOSIS — E119 Type 2 diabetes mellitus without complications: Secondary | ICD-10-CM | POA: Insufficient documentation

## 2016-03-22 DIAGNOSIS — K222 Esophageal obstruction: Secondary | ICD-10-CM | POA: Diagnosis not present

## 2016-03-22 DIAGNOSIS — F419 Anxiety disorder, unspecified: Secondary | ICD-10-CM | POA: Insufficient documentation

## 2016-03-22 HISTORY — PX: ESOPHAGOGASTRODUODENOSCOPY (EGD) WITH PROPOFOL: SHX5813

## 2016-03-22 HISTORY — PX: BALLOON DILATION: SHX5330

## 2016-03-22 HISTORY — DX: Dysphagia, unspecified: R13.10

## 2016-03-22 LAB — GLUCOSE, CAPILLARY: Glucose-Capillary: 95 mg/dL (ref 65–99)

## 2016-03-22 SURGERY — ESOPHAGOGASTRODUODENOSCOPY (EGD) WITH PROPOFOL
Anesthesia: Monitor Anesthesia Care

## 2016-03-22 MED ORDER — PROPOFOL 500 MG/50ML IV EMUL
INTRAVENOUS | Status: DC | PRN
  Administered 2016-03-22: 200 ug/kg/min via INTRAVENOUS

## 2016-03-22 MED ORDER — LIDOCAINE HCL (CARDIAC) 20 MG/ML IV SOLN
INTRAVENOUS | Status: DC | PRN
  Administered 2016-03-22: 75 mg via INTRAVENOUS

## 2016-03-22 MED ORDER — PROPOFOL 10 MG/ML IV BOLUS
INTRAVENOUS | Status: AC
  Filled 2016-03-22: qty 40

## 2016-03-22 MED ORDER — LABETALOL HCL 5 MG/ML IV SOLN
INTRAVENOUS | Status: DC | PRN
  Administered 2016-03-22: 5 mg via INTRAVENOUS

## 2016-03-22 MED ORDER — GLYCOPYRROLATE 0.2 MG/ML IJ SOLN
INTRAMUSCULAR | Status: DC | PRN
  Administered 2016-03-22: 0.2 mg via INTRAVENOUS

## 2016-03-22 MED ORDER — LIDOCAINE 2% (20 MG/ML) 5 ML SYRINGE
INTRAMUSCULAR | Status: AC
  Filled 2016-03-22: qty 5

## 2016-03-22 MED ORDER — LACTATED RINGERS IV SOLN
INTRAVENOUS | Status: DC
  Administered 2016-03-22: 1000 mL via INTRAVENOUS

## 2016-03-22 MED ORDER — LABETALOL HCL 5 MG/ML IV SOLN
INTRAVENOUS | Status: AC
  Filled 2016-03-22: qty 4

## 2016-03-22 MED ORDER — GLYCOPYRROLATE 0.2 MG/ML IV SOSY
PREFILLED_SYRINGE | INTRAVENOUS | Status: AC
  Filled 2016-03-22: qty 3

## 2016-03-22 SURGICAL SUPPLY — 14 items

## 2016-03-22 NOTE — Discharge Instructions (Signed)

## 2016-03-22 NOTE — Anesthesia Preprocedure Evaluation (Signed)
Anesthesia Evaluation  Patient identified by MRN, date of birth, ID band Patient awake    Reviewed: Allergy & Precautions, NPO status , Patient's Chart, lab work & pertinent test results  Airway Mallampati: II  TM Distance: >3 FB Neck ROM: Full    Dental no notable dental hx.    Pulmonary neg pulmonary ROS,    Pulmonary exam normal breath sounds clear to auscultation       Cardiovascular hypertension, negative cardio ROS Normal cardiovascular exam Rhythm:Regular Rate:Normal     Neuro/Psych PSYCHIATRIC DISORDERS Anxiety negative neurological ROS     GI/Hepatic negative GI ROS, Neg liver ROS,   Endo/Other  diabetes, Type 2  Renal/GU negative Renal ROS     Musculoskeletal negative musculoskeletal ROS (+)   Abdominal   Peds  Hematology negative hematology ROS (+)   Anesthesia Other Findings   Reproductive/Obstetrics negative OB ROS                             Anesthesia Physical Anesthesia Plan  ASA: II  Anesthesia Plan: MAC   Post-op Pain Management:    Induction: Intravenous  Airway Management Planned:   Additional Equipment:   Intra-op Plan:   Post-operative Plan:   Informed Consent: I have reviewed the patients History and Physical, chart, labs and discussed the procedure including the risks, benefits and alternatives for the proposed anesthesia with the patient or authorized representative who has indicated his/her understanding and acceptance.   Dental advisory given  Plan Discussed with: CRNA  Anesthesia Plan Comments:         Anesthesia Quick Evaluation

## 2016-03-22 NOTE — H&P (Signed)
Problem: Esophageal dysphagia. 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 09/06/2011 normal esophagogastroduodenoscopy with screen for eosinophilic esophagitis was performed. 09/23/2011 barium esophagram was consistent with distal esophageal spasm. August 2013 normal colonoscopy to the hepatic flexure followed by air contrast barium enema were performed.  History: The patient is a 70 year old female born 1946/05/24. She has chronic liquid-solid food esophageal dysphagia. She localizes the food and liquid Y Ching in her mid esophagus associated with chest pain. In April 2013 barium esophagram was consistent with distal esophageal spasm. Diagnostic esophagogastroduodenoscopy with screen for eosinophilic esophagitis was normal.  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  The patient is scheduled to undergo repeat esophagogastroduodenoscopy with possible esophageal stricture dilation.  Differential diagnosis would include distal esophageal stricture versus achalasia.  Past medical history: Type 2 diabetes mellitus. Hypertension. Osteoarthritis of the knees. Mild aortic valve stenosis. Vitamin B 12 deficiency. Chronic lymphocytic leukemia. Total abdominal hysterectomy with bilateral salpingo-oophorectomy. Arthroscopic knee surgery bilaterally.  Medication allergies: Codeine. Augmentin.  Exam: The patient is alert and lying comfortably on the endoscopy stretcher Abdomen is soft and nontender to palpation. Cardiac exam reveals a regular rhythm. Lungs are clear to auscultation.  Plan: Proceed with diagnostic esophagogastroduodenoscopy with possible distal esophageal stricture dilation.

## 2016-03-22 NOTE — Op Note (Signed)
[facility] Patient Name: Beth Malone Procedure Date: 03/22/2016 MRN: [id number] Attending MD: [name] , MD Date of Birth: [birth year] CSN: [id number] Age: 70 Admit Type: Outpatient Procedure:                Upper GI endoscopy Indications:              Dysphagia Providers:                [name], MD, Cleda Daub, RN, Cherylynn Ridges, Technician, Enrigue Catena, CRNA Referring MD:              Medicines:                Propofol per Anesthesia Complications:            No immediate complications. Estimated Blood Loss:     Estimated blood loss: none. Procedure:                Pre-Anesthesia Assessment:                           - Prior to the procedure, a History and Physical                            was performed, and patient medications and                            allergies were reviewed. The patient's tolerance of                            previous anesthesia was also reviewed. The risks                            and benefits of the procedure and the sedation                            options and risks were discussed with the patient.                            All questions were answered, and informed consent                            was obtained. Prior Anticoagulants: The patient has                            taken aspirin, last dose was 1 day prior to                            procedure. ASA Grade Assessment: II - A patient                            with mild systemic disease. After reviewing the  risks and benefits, the patient was deemed in                            satisfactory condition to undergo the procedure.                           After obtaining informed consent, the endoscope was                            passed under direct vision. Throughout the                            procedure, the patient's blood pressure, pulse, and                            oxygen saturations  were monitored continuously. The                            EG-[id number] ([id number]) scope was introduced through the                            mouth, and advanced to the second part of duodenum.                            The upper GI endoscopy was accomplished without                            difficulty. The patient tolerated the procedure                            well. Scope In: Scope Out: Findings:      The Z-line was regular and was found 39 cm from the incisors.      The examined esophagus was normal. The 15?"16.5?"18 mm esophageal balloon       dilator was inflated at the esophagogastric junction without mucosal       disruption indicating the absence of an intrinsic esophageal stricture.       I suspect the patient has achalasia.      The entire examined stomach was normal.      The examined duodenum was normal. Impression:               - Z-line regular, 39 cm from the incisors.                           - Normal esophagus.                           - Normal stomach.                           - Normal examined duodenum.                           - No specimens collected. Moderate Sedation:      N/A- Per Anesthesia Care Recommendation:           -  Patient has a contact number available for                            emergencies. The signs and symptoms of potential                            delayed complications were discussed with the                            patient. Return to normal activities tomorrow.                            Written discharge instructions were provided to the                            patient.                           - Perform high resolution esophageal manometry at                            appointment to be scheduled.                           - Resume previous diet.                           - Continue present medications. Procedure Code(s):        --- Professional ---                           [phone], Esophagogastroduodenoscopy, flexible,                             transoral; diagnostic, including collection of                            specimen(s) by brushing or washing, when performed                            (separate procedure) Diagnosis Code(s):        --- Professional ---                           R13.10, Dysphagia, unspecified CPT copyright 2016 [employer]. All rights reserved. The codes documented in this report are preliminary and upon coder review may  be revised to meet current compliance requirements. Earle Gell, MD [name], MD 03/22/2016 12:42:34 PM This report has been signed electronically. Number of Addenda: 0

## 2016-03-22 NOTE — Transfer of Care (Signed)
Immediate Anesthesia Transfer of Care Note  Patient: Beth Malone  Procedure(s) Performed: Procedure(s): ESOPHAGOGASTRODUODENOSCOPY (EGD) WITH PROPOFOL (N/A) BALLOON DILATION (N/A)  Patient Location: PACU  Anesthesia Type:MAC  Level of Consciousness: awake, alert , oriented and patient cooperative  Airway & Oxygen Therapy: Patient Spontanous Breathing and Patient connected to nasal cannula oxygen  Post-op Assessment: Report given to RN, Post -op Vital signs reviewed and stable and Patient moving all extremities X 4  Post vital signs: stable  Last Vitals:  Vitals:   03/22/16 1058  BP: (!) 170/87  Pulse: 76  Resp: 20  Temp: 36.7 C    Last Pain:  Vitals:   03/22/16 1058  TempSrc: Oral         Complications: No apparent anesthesia complications

## 2016-03-23 NOTE — Anesthesia Postprocedure Evaluation (Signed)
Anesthesia Post Note  Patient: Beth Malone  Procedure(s) Performed: Procedure(s) (LRB): ESOPHAGOGASTRODUODENOSCOPY (EGD) WITH PROPOFOL (N/A) BALLOON DILATION (N/A)  Patient location during evaluation: PACU Anesthesia Type: MAC Level of consciousness: awake and alert Pain management: pain level controlled Vital Signs Assessment: post-procedure vital signs reviewed and stable Respiratory status: spontaneous breathing Cardiovascular status: stable Anesthetic complications: no     Last Vitals:  Vitals:   03/22/16 1310 03/22/16 1320  BP: (!) 184/78 (!) 158/69  Pulse: 88 87  Resp: 18 15  Temp:      Last Pain:  Vitals:   03/22/16 1300  TempSrc: Oral   Pain Goal:                 Nolon Nations

## 2016-03-24 ENCOUNTER — Encounter (HOSPITAL_COMMUNITY): Payer: Self-pay | Admitting: Gastroenterology

## 2016-03-24 DIAGNOSIS — M6281 Muscle weakness (generalized): Secondary | ICD-10-CM | POA: Diagnosis not present

## 2016-03-24 DIAGNOSIS — R2689 Other abnormalities of gait and mobility: Secondary | ICD-10-CM | POA: Diagnosis not present

## 2016-03-24 DIAGNOSIS — M25551 Pain in right hip: Secondary | ICD-10-CM | POA: Diagnosis not present

## 2016-04-05 ENCOUNTER — Encounter (HOSPITAL_COMMUNITY)
Admission: RE | Disposition: A | Discharge status: Home / Self Care | Payer: Self-pay | Source: Ambulatory Visit | Attending: Gastroenterology

## 2016-04-05 ENCOUNTER — Ambulatory Visit (HOSPITAL_COMMUNITY)
Admission: RE | Admit: 2016-04-05 | Discharge: 2016-04-05 | Disposition: A | Discharge status: Home / Self Care | Payer: Medicare Other | Source: Ambulatory Visit | Attending: Gastroenterology | Admitting: Gastroenterology

## 2016-04-05 DIAGNOSIS — K220 Achalasia of cardia: Secondary | ICD-10-CM | POA: Insufficient documentation

## 2016-04-05 DIAGNOSIS — R1310 Dysphagia, unspecified: Secondary | ICD-10-CM | POA: Diagnosis not present

## 2016-04-05 HISTORY — PX: ESOPHAGEAL MANOMETRY: SHX5429

## 2016-04-05 SURGERY — MANOMETRY, ESOPHAGUS

## 2016-04-05 MED ORDER — LIDOCAINE VISCOUS 2 % MT SOLN
OROMUCOSAL | Status: AC
  Filled 2016-04-05: qty 15

## 2016-04-05 SURGICAL SUPPLY — 2 items
FACESHIELD LNG OPTICON STERILE (SAFETY) IMPLANT
GLOVE BIO SURGEON STRL SZ8 (GLOVE) ×4 IMPLANT

## 2016-04-05 NOTE — Progress Notes (Signed)
Esophageal Manometry done per protocol. Pt tolerated well without complication.  Report to be given to Dr.Johnson.

## 2016-04-05 NOTE — H&P (Signed)
Problem: Esophageal dysphagia rule out achalasia  History: The patient is a 70 year old female born Oct 07, 1945. She has chronic liquid-solid food esophageal dysphagia associated with chest discomfort. 03/05/2016 barium esophagram showed disruption of all primary esophageal peristaltic waves and tapering of the distal esophagus. The 13 mm barium tablet stuck in the distal esophagus for several minutes. 03/22/2016 diagnostic esophagogastroduodenoscopy was normal.  The patient is scheduled to undergo high-resolution esophageal manometry to look for signs of achalasia  Past medical history: Type 2 diabetes mellitus. Hypertension. Osteoarthritis. Hypercholesterolemia. Microalbuminuria. Right femoral fracture. Mild aortic valve stenosis. Vitamin B 12 deficiency. Total abdominal hysterectomy. Bilateral salpingo-oophorectomy. Bilateral arthroscopic knee surgeries. Right femoral fracture surgery. 09/06/2011 normal esophagogastroduodenoscopy with screen for eosinophilic esophagitis was performed. 09/23/2011 barium esophagram suggested distal esophageal spasm. August 2013 normal colonoscopy to the hepatic flexure followed by air contrast barium enema were performed.    Medication allergies: Codeine. Augmentin. Benzonatate  Plan: Proceed with diagnostic high-resolution esophageal manometry to rule out achalasia.

## 2016-04-06 ENCOUNTER — Encounter (HOSPITAL_COMMUNITY): Payer: Self-pay | Admitting: Gastroenterology

## 2016-05-03 DIAGNOSIS — E119 Type 2 diabetes mellitus without complications: Secondary | ICD-10-CM | POA: Diagnosis not present

## 2016-05-03 DIAGNOSIS — I10 Essential (primary) hypertension: Secondary | ICD-10-CM | POA: Diagnosis not present

## 2016-05-03 DIAGNOSIS — D649 Anemia, unspecified: Secondary | ICD-10-CM | POA: Diagnosis not present

## 2016-05-03 DIAGNOSIS — Z7984 Long term (current) use of oral hypoglycemic drugs: Secondary | ICD-10-CM | POA: Diagnosis not present

## 2016-05-03 DIAGNOSIS — M1712 Unilateral primary osteoarthritis, left knee: Secondary | ICD-10-CM | POA: Diagnosis not present

## 2016-05-03 DIAGNOSIS — M25562 Pain in left knee: Secondary | ICD-10-CM | POA: Diagnosis not present

## 2016-05-12 DIAGNOSIS — M25562 Pain in left knee: Secondary | ICD-10-CM | POA: Diagnosis not present

## 2016-05-12 DIAGNOSIS — M1712 Unilateral primary osteoarthritis, left knee: Secondary | ICD-10-CM | POA: Diagnosis not present

## 2016-05-13 ENCOUNTER — Other Ambulatory Visit: Payer: Self-pay | Admitting: Gastroenterology

## 2016-05-20 DIAGNOSIS — M25562 Pain in left knee: Secondary | ICD-10-CM | POA: Diagnosis not present

## 2016-05-20 DIAGNOSIS — M1712 Unilateral primary osteoarthritis, left knee: Secondary | ICD-10-CM | POA: Diagnosis not present

## 2016-05-21 ENCOUNTER — Encounter (HOSPITAL_COMMUNITY): Payer: Self-pay

## 2016-06-01 ENCOUNTER — Ambulatory Visit (HOSPITAL_COMMUNITY): Payer: Medicare Other | Admitting: Anesthesiology

## 2016-06-01 ENCOUNTER — Ambulatory Visit (HOSPITAL_COMMUNITY)
Admission: RE | Admit: 2016-06-01 | Discharge: 2016-06-01 | Disposition: A | Discharge status: Home / Self Care | Payer: Medicare Other | Source: Ambulatory Visit | Attending: Gastroenterology | Admitting: Gastroenterology

## 2016-06-01 ENCOUNTER — Encounter (HOSPITAL_COMMUNITY)
Admission: RE | Disposition: A | Discharge status: Home / Self Care | Payer: Self-pay | Source: Ambulatory Visit | Attending: Gastroenterology

## 2016-06-01 ENCOUNTER — Encounter (HOSPITAL_COMMUNITY): Payer: Self-pay

## 2016-06-01 DIAGNOSIS — Z90710 Acquired absence of both cervix and uterus: Secondary | ICD-10-CM | POA: Diagnosis not present

## 2016-06-01 DIAGNOSIS — Z880 Allergy status to penicillin: Secondary | ICD-10-CM | POA: Insufficient documentation

## 2016-06-01 DIAGNOSIS — Z9889 Other specified postprocedural states: Secondary | ICD-10-CM | POA: Insufficient documentation

## 2016-06-01 DIAGNOSIS — E119 Type 2 diabetes mellitus without complications: Secondary | ICD-10-CM | POA: Insufficient documentation

## 2016-06-01 DIAGNOSIS — M1990 Unspecified osteoarthritis, unspecified site: Secondary | ICD-10-CM | POA: Insufficient documentation

## 2016-06-01 DIAGNOSIS — Z7984 Long term (current) use of oral hypoglycemic drugs: Secondary | ICD-10-CM | POA: Diagnosis not present

## 2016-06-01 DIAGNOSIS — Z885 Allergy status to narcotic agent status: Secondary | ICD-10-CM | POA: Insufficient documentation

## 2016-06-01 DIAGNOSIS — K319 Disease of stomach and duodenum, unspecified: Secondary | ICD-10-CM | POA: Insufficient documentation

## 2016-06-01 DIAGNOSIS — Z79899 Other long term (current) drug therapy: Secondary | ICD-10-CM | POA: Diagnosis not present

## 2016-06-01 DIAGNOSIS — Z90722 Acquired absence of ovaries, bilateral: Secondary | ICD-10-CM | POA: Diagnosis not present

## 2016-06-01 DIAGNOSIS — I10 Essential (primary) hypertension: Secondary | ICD-10-CM | POA: Insufficient documentation

## 2016-06-01 DIAGNOSIS — Z7951 Long term (current) use of inhaled steroids: Secondary | ICD-10-CM | POA: Insufficient documentation

## 2016-06-01 DIAGNOSIS — K220 Achalasia of cardia: Secondary | ICD-10-CM | POA: Diagnosis not present

## 2016-06-01 DIAGNOSIS — Z7982 Long term (current) use of aspirin: Secondary | ICD-10-CM | POA: Insufficient documentation

## 2016-06-01 DIAGNOSIS — F419 Anxiety disorder, unspecified: Secondary | ICD-10-CM | POA: Diagnosis not present

## 2016-06-01 DIAGNOSIS — R1310 Dysphagia, unspecified: Secondary | ICD-10-CM | POA: Diagnosis not present

## 2016-06-01 DIAGNOSIS — E7800 Pure hypercholesterolemia, unspecified: Secondary | ICD-10-CM | POA: Insufficient documentation

## 2016-06-01 HISTORY — PX: ESOPHAGOGASTRODUODENOSCOPY (EGD) WITH PROPOFOL: SHX5813

## 2016-06-01 LAB — GLUCOSE, CAPILLARY: GLUCOSE-CAPILLARY: 123 mg/dL — AB (ref 65–99)

## 2016-06-01 SURGERY — ESOPHAGOGASTRODUODENOSCOPY (EGD) WITH PROPOFOL
Anesthesia: Monitor Anesthesia Care

## 2016-06-01 MED ORDER — SODIUM CHLORIDE 0.9 % IJ SOLN
INTRAMUSCULAR | Status: DC | PRN
  Administered 2016-06-01: 08:00:00 via SUBMUCOSAL

## 2016-06-01 MED ORDER — ONABOTULINUMTOXINA 100 UNITS IJ SOLR
INTRAMUSCULAR | Status: AC
  Filled 2016-06-01: qty 100

## 2016-06-01 MED ORDER — SODIUM CHLORIDE 0.9 % IJ SOLN
INTRAMUSCULAR | Status: AC
  Filled 2016-06-01: qty 10

## 2016-06-01 MED ORDER — LACTATED RINGERS IV SOLN
INTRAVENOUS | Status: DC
  Administered 2016-06-01: 1000 mL via INTRAVENOUS

## 2016-06-01 MED ORDER — PROPOFOL 500 MG/50ML IV EMUL
INTRAVENOUS | Status: DC | PRN
  Administered 2016-06-01: 125 ug/kg/min via INTRAVENOUS

## 2016-06-01 MED ORDER — PROPOFOL 10 MG/ML IV BOLUS
INTRAVENOUS | Status: AC
  Filled 2016-06-01: qty 60

## 2016-06-01 MED ORDER — LIDOCAINE 2% (20 MG/ML) 5 ML SYRINGE
INTRAMUSCULAR | Status: AC
  Filled 2016-06-01: qty 5

## 2016-06-01 MED ORDER — SODIUM CHLORIDE 0.9 % IV SOLN: INTRAVENOUS | Status: DC

## 2016-06-01 MED ORDER — LIDOCAINE 2% (20 MG/ML) 5 ML SYRINGE
INTRAMUSCULAR | Status: DC | PRN
  Administered 2016-06-01: 40 mg via INTRAVENOUS

## 2016-06-01 MED ORDER — PROPOFOL 10 MG/ML IV BOLUS
INTRAVENOUS | Status: DC | PRN
  Administered 2016-06-01: 30 mg via INTRAVENOUS

## 2016-06-01 SURGICAL SUPPLY — 14 items

## 2016-06-01 NOTE — Discharge Instructions (Signed)

## 2016-06-01 NOTE — H&P (Signed)
Problem: Achalasia type II.  History: The patient is a 71 year old female born 11/03/1945. She was diagnosed with type II achalasia by diagnostic barium esophagram, esophagogastroduodenoscopy followed by high resolution esophageal manometry.  She is scheduled to undergo esophagogastroduodenoscopy with Botox injection into the lower esophageal sphincter muscle today.  Past medical history: Type 2 diabetes mellitus. Hypertension. Osteoarthritis. Hypercholesterolemia. Microalbuminuria. Right femoral fracture. Type II achalasia. Mild aortic valve stenosis. Vitamin B 12 deficiency. Total abdominal hysterectomy. Bilateral salpingo-oophorectomy. Bilateral arthroscopic knee surgeries. Right femoral fracture surgery.  Medication allergies: Codeine causes itching. Augmentin causes vomiting.  Exam: The patient is alert and lying comfortably on the endoscopy stretcher. Abdomen is soft and nontender to palpation. Lungs are clear to auscultation. Cardiac exam reveals a regular rhythm.  Plan: Proceed with esophagogastroduodenoscopy with Botox injection into the lower esophageal sphincter muscle.

## 2016-06-01 NOTE — Op Note (Signed)
[facility] Patient Name: Beth Malone Procedure Date: 06/01/2016 MRN: [id number] Attending MD: [name] , MD Date of Birth: 06-Jan-1946 CSN: [id number] Age: 71 Admit Type: Outpatient Procedure:                Upper GI endoscopy Indications:              Type 2 Achalasia, For botulinum toxin injection of                            achalasia Providers:                [name], MD, Laverta Baltimore RN, RN,                            Ralene Bathe, Technician, Dione Booze, CRNA Referring MD:              Medicines:                Propofol per Anesthesia Complications:            No immediate complications. Estimated Blood Loss:     Estimated blood loss: none. Procedure:                Pre-Anesthesia Assessment:                           - Prior to the procedure, a History and Physical                            was performed, and patient medications and                            allergies were reviewed. The patient's tolerance of                            previous anesthesia was also reviewed. The risks                            and benefits of the procedure and the sedation                            options and risks were discussed with the patient.                            All questions were answered, and informed consent                            was obtained. Prior Anticoagulants: The patient has                            taken aspirin, last dose was 1 day prior to                            procedure. ASA Grade Assessment: III - A patient  with severe systemic disease. After reviewing the                            risks and benefits, the patient was deemed in                            satisfactory condition to undergo the procedure.                           After obtaining informed consent, the endoscope was                            passed under direct vision. Throughout the                            procedure,  the patient's blood pressure, pulse, and                            oxygen saturations were monitored continuously. The                            EG-[id number] ([id number]) scope was introduced through the                            mouth, and advanced to the second part of duodenum.                            The upper GI endoscopy was accomplished without                            difficulty. The patient tolerated the procedure                            well. Scope In: Scope Out: Findings:      The Z-line was regular and was found 37 cm from the incisors.      The examined esophagus was normal. The lower esophageal sphincter muscle       was successfully injected with 100 units botulinum toxin four quadrant       injections with 25 units per injection)      A few, non-bleeding erosions were found in the gastric antrum associated       with daily aspirin 81 mg. There were no stigmata of recent bleeding. The       stomach was otherwise normal.      The examined duodenum was normal. Impression:               - Z-line regular, 37 cm from the incisors.                           - Normal esophagus. Injected with botulinum toxin.                           - Non-bleeding erosive gastropathy.                           -  Normal examined duodenum.                           - No specimens collected. Moderate Sedation:      N/A- Per Anesthesia Care Recommendation:           - Patient has a contact number available for                            emergencies. The signs and symptoms of potential                            delayed complications were discussed with the                            patient. Return to normal activities tomorrow.                            Written discharge instructions were provided to the                            patient.                           - Return to primary care physician PRN.                           - Resume previous diet.                           - Continue  present medications. Procedure Code(s):        --- Professional ---                           [phone], Esophagogastroduodenoscopy, flexible,                            transoral; with directed submucosal injection(s),                            any substance Diagnosis Code(s):        --- Professional ---                           K22.0, Achalasia of cardia CPT copyright 2016 American Medical Association. All rights reserved. The codes documented in this report are preliminary and upon coder review may  be revised to meet current compliance requirements. Earle Gell, MD [name], MD 06/01/2016 8:26:00 AM This report has been signed electronically. Number of Addenda: 0

## 2016-06-01 NOTE — Anesthesia Procedure Notes (Signed)
Procedure Name: MAC Date/Time: 06/01/2016 8:00 AM Performed by: Dione Booze Pre-anesthesia Checklist: Patient identified, Emergency Drugs available, Suction available and Patient being monitored Patient Re-evaluated:Patient Re-evaluated prior to inductionOxygen Delivery Method: Circle system utilized and Nasal cannula Placement Confirmation: positive ETCO2

## 2016-06-01 NOTE — Transfer of Care (Signed)
Immediate Anesthesia Transfer of Care Note  Patient: Beth Malone  Procedure(s) Performed: Procedure(s): ESOPHAGOGASTRODUODENOSCOPY (EGD) WITH PROPOFOL (N/A)  Patient Location: PACU and Endoscopy Unit  Anesthesia Type:MAC  Level of Consciousness: awake, alert  and patient cooperative  Airway & Oxygen Therapy: Patient Spontanous Breathing and Patient connected to nasal cannula oxygen  Post-op Assessment: Report given to RN and Post -op Vital signs reviewed and stable  Post vital signs: Reviewed and stable  Last Vitals:  Vitals:   06/01/16 0639  BP: (!) 151/74  Pulse: 80  Resp: 16  Temp: 36.4 C    Last Pain:  Vitals:   06/01/16 0639  TempSrc: Oral         Complications: No apparent anesthesia complications

## 2016-06-01 NOTE — Anesthesia Preprocedure Evaluation (Signed)
Anesthesia Evaluation  Patient identified by MRN, date of birth, ID band Patient awake    Reviewed: Allergy & Precautions, NPO status , Patient's Chart, lab work & pertinent test results  Airway Mallampati: II  TM Distance: >3 FB Neck ROM: Full    Dental no notable dental hx.    Pulmonary neg pulmonary ROS,    Pulmonary exam normal breath sounds clear to auscultation       Cardiovascular hypertension, Pt. on medications negative cardio ROS Normal cardiovascular exam Rhythm:Regular Rate:Normal     Neuro/Psych PSYCHIATRIC DISORDERS Anxiety negative neurological ROS     GI/Hepatic Neg liver ROS, achalasia   Endo/Other  diabetes, Type 2  Renal/GU negative Renal ROS     Musculoskeletal negative musculoskeletal ROS (+)   Abdominal   Peds  Hematology negative hematology ROS (+)   Anesthesia Other Findings   Reproductive/Obstetrics negative OB ROS                             Anesthesia Physical  Anesthesia Plan  ASA: II  Anesthesia Plan: MAC   Post-op Pain Management:    Induction: Intravenous  Airway Management Planned:   Additional Equipment:   Intra-op Plan:   Post-operative Plan:   Informed Consent: I have reviewed the patients History and Physical, chart, labs and discussed the procedure including the risks, benefits and alternatives for the proposed anesthesia with the patient or authorized representative who has indicated his/her understanding and acceptance.   Dental advisory given  Plan Discussed with: CRNA  Anesthesia Plan Comments:         Anesthesia Quick Evaluation

## 2016-06-01 NOTE — Anesthesia Postprocedure Evaluation (Signed)
Anesthesia Post Note  Patient: Beth Malone  Procedure(s) Performed: Procedure(s) (LRB): ESOPHAGOGASTRODUODENOSCOPY (EGD) WITH PROPOFOL (N/A)  Patient location during evaluation: PACU Anesthesia Type: MAC Level of consciousness: awake and alert Pain management: pain level controlled Vital Signs Assessment: post-procedure vital signs reviewed and stable Respiratory status: spontaneous breathing, nonlabored ventilation, respiratory function stable and patient connected to nasal cannula oxygen Cardiovascular status: stable and blood pressure returned to baseline Anesthetic complications: no       Last Vitals:  Vitals:   06/01/16 0639 06/01/16 0820  BP: (!) 151/74 (!) 151/82  Pulse: 80 81  Resp: 16 18  Temp: 36.4 C 36.6 C    Last Pain:  Vitals:   06/01/16 0820  TempSrc: Oral                 Tiajuana Amass

## 2016-06-02 ENCOUNTER — Encounter (HOSPITAL_COMMUNITY): Payer: Self-pay | Admitting: Gastroenterology

## 2016-06-03 DIAGNOSIS — M1712 Unilateral primary osteoarthritis, left knee: Secondary | ICD-10-CM | POA: Diagnosis not present

## 2016-06-03 DIAGNOSIS — M25562 Pain in left knee: Secondary | ICD-10-CM | POA: Diagnosis not present

## 2016-06-30 DIAGNOSIS — M25562 Pain in left knee: Secondary | ICD-10-CM | POA: Diagnosis not present

## 2016-06-30 DIAGNOSIS — M1712 Unilateral primary osteoarthritis, left knee: Secondary | ICD-10-CM | POA: Diagnosis not present

## 2016-07-08 ENCOUNTER — Other Ambulatory Visit: Payer: Self-pay | Admitting: Geriatric Medicine

## 2016-07-08 DIAGNOSIS — Z1231 Encounter for screening mammogram for malignant neoplasm of breast: Secondary | ICD-10-CM

## 2016-07-16 ENCOUNTER — Ambulatory Visit
Admission: RE | Admit: 2016-07-16 | Discharge: 2016-07-16 | Disposition: A | Discharge status: Home / Self Care | Payer: Medicare Other | Source: Ambulatory Visit | Attending: Geriatric Medicine | Admitting: Geriatric Medicine

## 2016-07-16 DIAGNOSIS — Z1231 Encounter for screening mammogram for malignant neoplasm of breast: Secondary | ICD-10-CM

## 2016-08-02 DIAGNOSIS — S93491A Sprain of other ligament of right ankle, initial encounter: Secondary | ICD-10-CM | POA: Diagnosis not present

## 2016-08-02 DIAGNOSIS — M25571 Pain in right ankle and joints of right foot: Secondary | ICD-10-CM | POA: Diagnosis not present

## 2016-08-05 DIAGNOSIS — M25671 Stiffness of right ankle, not elsewhere classified: Secondary | ICD-10-CM | POA: Diagnosis not present

## 2016-08-05 DIAGNOSIS — M25571 Pain in right ankle and joints of right foot: Secondary | ICD-10-CM | POA: Diagnosis not present

## 2016-08-05 DIAGNOSIS — S93491S Sprain of other ligament of right ankle, sequela: Secondary | ICD-10-CM | POA: Diagnosis not present

## 2016-08-05 DIAGNOSIS — M25471 Effusion, right ankle: Secondary | ICD-10-CM | POA: Diagnosis not present

## 2016-08-06 DIAGNOSIS — M25571 Pain in right ankle and joints of right foot: Secondary | ICD-10-CM | POA: Diagnosis not present

## 2016-08-06 DIAGNOSIS — M25671 Stiffness of right ankle, not elsewhere classified: Secondary | ICD-10-CM | POA: Diagnosis not present

## 2016-08-06 DIAGNOSIS — M25471 Effusion, right ankle: Secondary | ICD-10-CM | POA: Diagnosis not present

## 2016-08-06 DIAGNOSIS — S93491S Sprain of other ligament of right ankle, sequela: Secondary | ICD-10-CM | POA: Diagnosis not present

## 2016-08-10 DIAGNOSIS — S93491S Sprain of other ligament of right ankle, sequela: Secondary | ICD-10-CM | POA: Diagnosis not present

## 2016-08-10 DIAGNOSIS — M25671 Stiffness of right ankle, not elsewhere classified: Secondary | ICD-10-CM | POA: Diagnosis not present

## 2016-08-10 DIAGNOSIS — M25571 Pain in right ankle and joints of right foot: Secondary | ICD-10-CM | POA: Diagnosis not present

## 2016-08-10 DIAGNOSIS — M25471 Effusion, right ankle: Secondary | ICD-10-CM | POA: Diagnosis not present

## 2016-08-11 DIAGNOSIS — S93491S Sprain of other ligament of right ankle, sequela: Secondary | ICD-10-CM | POA: Diagnosis not present

## 2016-08-11 DIAGNOSIS — M25471 Effusion, right ankle: Secondary | ICD-10-CM | POA: Diagnosis not present

## 2016-08-11 DIAGNOSIS — M25571 Pain in right ankle and joints of right foot: Secondary | ICD-10-CM | POA: Diagnosis not present

## 2016-08-11 DIAGNOSIS — M25671 Stiffness of right ankle, not elsewhere classified: Secondary | ICD-10-CM | POA: Diagnosis not present

## 2016-08-12 DIAGNOSIS — S93491S Sprain of other ligament of right ankle, sequela: Secondary | ICD-10-CM | POA: Diagnosis not present

## 2016-08-12 DIAGNOSIS — M25571 Pain in right ankle and joints of right foot: Secondary | ICD-10-CM | POA: Diagnosis not present

## 2016-08-12 DIAGNOSIS — M25671 Stiffness of right ankle, not elsewhere classified: Secondary | ICD-10-CM | POA: Diagnosis not present

## 2016-08-12 DIAGNOSIS — M25471 Effusion, right ankle: Secondary | ICD-10-CM | POA: Diagnosis not present

## 2016-08-16 DIAGNOSIS — M25671 Stiffness of right ankle, not elsewhere classified: Secondary | ICD-10-CM | POA: Diagnosis not present

## 2016-08-16 DIAGNOSIS — M25571 Pain in right ankle and joints of right foot: Secondary | ICD-10-CM | POA: Diagnosis not present

## 2016-08-16 DIAGNOSIS — M25471 Effusion, right ankle: Secondary | ICD-10-CM | POA: Diagnosis not present

## 2016-08-16 DIAGNOSIS — S93491S Sprain of other ligament of right ankle, sequela: Secondary | ICD-10-CM | POA: Diagnosis not present

## 2016-08-17 ENCOUNTER — Ambulatory Visit: Payer: Medicare Other | Admitting: Podiatry

## 2016-08-18 DIAGNOSIS — M25471 Effusion, right ankle: Secondary | ICD-10-CM | POA: Diagnosis not present

## 2016-08-18 DIAGNOSIS — M25571 Pain in right ankle and joints of right foot: Secondary | ICD-10-CM | POA: Diagnosis not present

## 2016-08-18 DIAGNOSIS — S93491S Sprain of other ligament of right ankle, sequela: Secondary | ICD-10-CM | POA: Diagnosis not present

## 2016-08-18 DIAGNOSIS — M25671 Stiffness of right ankle, not elsewhere classified: Secondary | ICD-10-CM | POA: Diagnosis not present

## 2016-08-23 ENCOUNTER — Ambulatory Visit: Payer: Medicare Other | Admitting: Podiatry

## 2016-08-23 DIAGNOSIS — M25571 Pain in right ankle and joints of right foot: Secondary | ICD-10-CM | POA: Diagnosis not present

## 2016-08-23 DIAGNOSIS — S93491D Sprain of other ligament of right ankle, subsequent encounter: Secondary | ICD-10-CM | POA: Diagnosis not present

## 2016-09-04 DIAGNOSIS — S93491D Sprain of other ligament of right ankle, subsequent encounter: Secondary | ICD-10-CM | POA: Diagnosis not present

## 2016-09-08 DIAGNOSIS — M25571 Pain in right ankle and joints of right foot: Secondary | ICD-10-CM | POA: Diagnosis not present

## 2016-09-08 DIAGNOSIS — S92124A Nondisplaced fracture of body of right talus, initial encounter for closed fracture: Secondary | ICD-10-CM | POA: Diagnosis not present

## 2016-09-16 DIAGNOSIS — D3131 Benign neoplasm of right choroid: Secondary | ICD-10-CM | POA: Diagnosis not present

## 2016-09-16 DIAGNOSIS — H11002 Unspecified pterygium of left eye: Secondary | ICD-10-CM | POA: Diagnosis not present

## 2016-09-16 DIAGNOSIS — H11153 Pinguecula, bilateral: Secondary | ICD-10-CM | POA: Diagnosis not present

## 2016-10-06 DIAGNOSIS — S92124D Nondisplaced fracture of body of right talus, subsequent encounter for fracture with routine healing: Secondary | ICD-10-CM | POA: Diagnosis not present

## 2016-10-21 DIAGNOSIS — M1712 Unilateral primary osteoarthritis, left knee: Secondary | ICD-10-CM | POA: Diagnosis not present

## 2016-10-21 DIAGNOSIS — M25562 Pain in left knee: Secondary | ICD-10-CM | POA: Diagnosis not present

## 2016-10-27 ENCOUNTER — Ambulatory Visit: Payer: Medicare Other | Admitting: Podiatry

## 2016-11-05 DIAGNOSIS — S92124D Nondisplaced fracture of body of right talus, subsequent encounter for fracture with routine healing: Secondary | ICD-10-CM | POA: Diagnosis not present

## 2016-11-11 DIAGNOSIS — D1801 Hemangioma of skin and subcutaneous tissue: Secondary | ICD-10-CM | POA: Diagnosis not present

## 2016-11-11 DIAGNOSIS — L814 Other melanin hyperpigmentation: Secondary | ICD-10-CM | POA: Diagnosis not present

## 2016-11-11 DIAGNOSIS — L820 Inflamed seborrheic keratosis: Secondary | ICD-10-CM | POA: Diagnosis not present

## 2016-11-11 DIAGNOSIS — L281 Prurigo nodularis: Secondary | ICD-10-CM | POA: Diagnosis not present

## 2016-11-11 DIAGNOSIS — D485 Neoplasm of uncertain behavior of skin: Secondary | ICD-10-CM | POA: Diagnosis not present

## 2016-11-11 DIAGNOSIS — Z85828 Personal history of other malignant neoplasm of skin: Secondary | ICD-10-CM | POA: Diagnosis not present

## 2016-11-11 DIAGNOSIS — D225 Melanocytic nevi of trunk: Secondary | ICD-10-CM | POA: Diagnosis not present

## 2016-11-11 DIAGNOSIS — L905 Scar conditions and fibrosis of skin: Secondary | ICD-10-CM | POA: Diagnosis not present

## 2016-11-11 DIAGNOSIS — L821 Other seborrheic keratosis: Secondary | ICD-10-CM | POA: Diagnosis not present

## 2016-11-11 DIAGNOSIS — D2271 Melanocytic nevi of right lower limb, including hip: Secondary | ICD-10-CM | POA: Diagnosis not present

## 2016-11-11 DIAGNOSIS — D2262 Melanocytic nevi of left upper limb, including shoulder: Secondary | ICD-10-CM | POA: Diagnosis not present

## 2016-11-19 DIAGNOSIS — M8580 Other specified disorders of bone density and structure, unspecified site: Secondary | ICD-10-CM | POA: Diagnosis not present

## 2016-11-19 DIAGNOSIS — I129 Hypertensive chronic kidney disease with stage 1 through stage 4 chronic kidney disease, or unspecified chronic kidney disease: Secondary | ICD-10-CM | POA: Diagnosis not present

## 2016-11-19 DIAGNOSIS — Z79899 Other long term (current) drug therapy: Secondary | ICD-10-CM | POA: Diagnosis not present

## 2016-11-19 DIAGNOSIS — N183 Chronic kidney disease, stage 3 (moderate): Secondary | ICD-10-CM | POA: Diagnosis not present

## 2016-11-19 DIAGNOSIS — Z1389 Encounter for screening for other disorder: Secondary | ICD-10-CM | POA: Diagnosis not present

## 2016-11-19 DIAGNOSIS — Z6832 Body mass index (BMI) 32.0-32.9, adult: Secondary | ICD-10-CM | POA: Diagnosis not present

## 2016-11-19 DIAGNOSIS — E669 Obesity, unspecified: Secondary | ICD-10-CM | POA: Diagnosis not present

## 2016-11-19 DIAGNOSIS — E1121 Type 2 diabetes mellitus with diabetic nephropathy: Secondary | ICD-10-CM | POA: Diagnosis not present

## 2016-11-19 DIAGNOSIS — E7800 Pure hypercholesterolemia, unspecified: Secondary | ICD-10-CM | POA: Diagnosis not present

## 2016-11-19 DIAGNOSIS — Z0000 Encounter for general adult medical examination without abnormal findings: Secondary | ICD-10-CM | POA: Diagnosis not present

## 2016-12-17 DIAGNOSIS — Z79899 Other long term (current) drug therapy: Secondary | ICD-10-CM | POA: Diagnosis not present

## 2016-12-17 DIAGNOSIS — S92124D Nondisplaced fracture of body of right talus, subsequent encounter for fracture with routine healing: Secondary | ICD-10-CM | POA: Diagnosis not present

## 2017-03-09 DIAGNOSIS — M25562 Pain in left knee: Secondary | ICD-10-CM | POA: Diagnosis not present

## 2017-03-09 DIAGNOSIS — M1712 Unilateral primary osteoarthritis, left knee: Secondary | ICD-10-CM | POA: Diagnosis not present

## 2017-03-16 DIAGNOSIS — M25562 Pain in left knee: Secondary | ICD-10-CM | POA: Diagnosis not present

## 2017-03-16 DIAGNOSIS — M1712 Unilateral primary osteoarthritis, left knee: Secondary | ICD-10-CM | POA: Diagnosis not present

## 2017-03-16 DIAGNOSIS — Z23 Encounter for immunization: Secondary | ICD-10-CM | POA: Diagnosis not present

## 2017-03-22 DIAGNOSIS — M25562 Pain in left knee: Secondary | ICD-10-CM | POA: Diagnosis not present

## 2017-03-22 DIAGNOSIS — M1712 Unilateral primary osteoarthritis, left knee: Secondary | ICD-10-CM | POA: Diagnosis not present

## 2017-03-29 DIAGNOSIS — M25561 Pain in right knee: Secondary | ICD-10-CM | POA: Diagnosis not present

## 2017-03-29 DIAGNOSIS — M1711 Unilateral primary osteoarthritis, right knee: Secondary | ICD-10-CM | POA: Diagnosis not present

## 2017-04-06 DIAGNOSIS — M25562 Pain in left knee: Secondary | ICD-10-CM | POA: Diagnosis not present

## 2017-04-06 DIAGNOSIS — M1712 Unilateral primary osteoarthritis, left knee: Secondary | ICD-10-CM | POA: Diagnosis not present

## 2017-05-02 DIAGNOSIS — B9789 Other viral agents as the cause of diseases classified elsewhere: Secondary | ICD-10-CM | POA: Diagnosis not present

## 2017-05-02 DIAGNOSIS — J069 Acute upper respiratory infection, unspecified: Secondary | ICD-10-CM | POA: Diagnosis not present

## 2017-05-13 DIAGNOSIS — I129 Hypertensive chronic kidney disease with stage 1 through stage 4 chronic kidney disease, or unspecified chronic kidney disease: Secondary | ICD-10-CM | POA: Diagnosis not present

## 2017-05-13 DIAGNOSIS — E1121 Type 2 diabetes mellitus with diabetic nephropathy: Secondary | ICD-10-CM | POA: Diagnosis not present

## 2017-05-13 DIAGNOSIS — N183 Chronic kidney disease, stage 3 (moderate): Secondary | ICD-10-CM | POA: Diagnosis not present

## 2017-05-13 DIAGNOSIS — Z7984 Long term (current) use of oral hypoglycemic drugs: Secondary | ICD-10-CM | POA: Diagnosis not present

## 2017-05-13 DIAGNOSIS — J3089 Other allergic rhinitis: Secondary | ICD-10-CM | POA: Diagnosis not present

## 2017-07-18 DIAGNOSIS — J3089 Other allergic rhinitis: Secondary | ICD-10-CM | POA: Diagnosis not present

## 2017-07-18 DIAGNOSIS — I129 Hypertensive chronic kidney disease with stage 1 through stage 4 chronic kidney disease, or unspecified chronic kidney disease: Secondary | ICD-10-CM | POA: Diagnosis not present

## 2017-07-18 DIAGNOSIS — Z7984 Long term (current) use of oral hypoglycemic drugs: Secondary | ICD-10-CM | POA: Diagnosis not present

## 2017-07-18 DIAGNOSIS — E1121 Type 2 diabetes mellitus with diabetic nephropathy: Secondary | ICD-10-CM | POA: Diagnosis not present

## 2017-07-18 DIAGNOSIS — N183 Chronic kidney disease, stage 3 (moderate): Secondary | ICD-10-CM | POA: Diagnosis not present

## 2017-08-01 ENCOUNTER — Other Ambulatory Visit: Payer: Self-pay | Admitting: Geriatric Medicine

## 2017-08-01 DIAGNOSIS — Z1231 Encounter for screening mammogram for malignant neoplasm of breast: Secondary | ICD-10-CM

## 2017-08-19 ENCOUNTER — Ambulatory Visit (INDEPENDENT_AMBULATORY_CARE_PROVIDER_SITE_OTHER): Payer: Medicare Other | Admitting: Podiatry

## 2017-08-19 ENCOUNTER — Encounter: Payer: Self-pay | Admitting: Podiatry

## 2017-08-19 DIAGNOSIS — M79674 Pain in right toe(s): Secondary | ICD-10-CM

## 2017-08-19 DIAGNOSIS — B351 Tinea unguium: Secondary | ICD-10-CM

## 2017-08-19 DIAGNOSIS — L84 Corns and callosities: Secondary | ICD-10-CM | POA: Diagnosis not present

## 2017-08-19 DIAGNOSIS — M79675 Pain in left toe(s): Secondary | ICD-10-CM | POA: Diagnosis not present

## 2017-08-22 NOTE — Progress Notes (Signed)
Subjective:   Patient ID: Beth Malone, female   DOB: 72 y.o.   MRN: [id number]   HPI Patient presents with chronic nail disease 1-5 both feet and lesion on the left second toe that is painful when pressed and make shoe gear difficult   ROS      Objective:  Physical Exam  Neurovascular status intact with thick yellow brittle nailbeds 1-5 both feet and lesion formation of the second digit left is painful when palpated     Assessment:  Mycotic nail infection with pain 1-5 both feet with lesion formation left     Plan:  Debridement of painful nailbeds 1-5 both feet and lesion left with no iatrogenic bleeding noted

## 2017-08-29 ENCOUNTER — Ambulatory Visit
Admission: RE | Admit: 2017-08-29 | Discharge: 2017-08-29 | Disposition: A | Discharge status: Home / Self Care | Payer: Medicare Other | Source: Ambulatory Visit | Attending: Geriatric Medicine | Admitting: Geriatric Medicine

## 2017-08-29 DIAGNOSIS — M25541 Pain in joints of right hand: Secondary | ICD-10-CM | POA: Diagnosis not present

## 2017-08-29 DIAGNOSIS — Z1231 Encounter for screening mammogram for malignant neoplasm of breast: Secondary | ICD-10-CM

## 2017-08-29 DIAGNOSIS — N183 Chronic kidney disease, stage 3 (moderate): Secondary | ICD-10-CM | POA: Diagnosis not present

## 2017-08-29 DIAGNOSIS — I129 Hypertensive chronic kidney disease with stage 1 through stage 4 chronic kidney disease, or unspecified chronic kidney disease: Secondary | ICD-10-CM | POA: Diagnosis not present

## 2017-09-19 DIAGNOSIS — H11153 Pinguecula, bilateral: Secondary | ICD-10-CM | POA: Diagnosis not present

## 2017-09-19 DIAGNOSIS — H11002 Unspecified pterygium of left eye: Secondary | ICD-10-CM | POA: Diagnosis not present

## 2017-09-19 DIAGNOSIS — D3131 Benign neoplasm of right choroid: Secondary | ICD-10-CM | POA: Diagnosis not present

## 2017-11-25 ENCOUNTER — Ambulatory Visit: Payer: Medicare Other | Admitting: Podiatry

## 2017-12-13 DIAGNOSIS — E538 Deficiency of other specified B group vitamins: Secondary | ICD-10-CM | POA: Diagnosis not present

## 2017-12-13 DIAGNOSIS — D539 Nutritional anemia, unspecified: Secondary | ICD-10-CM | POA: Diagnosis not present

## 2017-12-19 DIAGNOSIS — E538 Deficiency of other specified B group vitamins: Secondary | ICD-10-CM | POA: Diagnosis not present

## 2017-12-20 DIAGNOSIS — E538 Deficiency of other specified B group vitamins: Secondary | ICD-10-CM | POA: Diagnosis not present

## 2017-12-21 DIAGNOSIS — E538 Deficiency of other specified B group vitamins: Secondary | ICD-10-CM | POA: Diagnosis not present

## 2017-12-22 DIAGNOSIS — E538 Deficiency of other specified B group vitamins: Secondary | ICD-10-CM | POA: Diagnosis not present

## 2017-12-23 DIAGNOSIS — E538 Deficiency of other specified B group vitamins: Secondary | ICD-10-CM | POA: Diagnosis not present

## 2017-12-29 DIAGNOSIS — D2272 Melanocytic nevi of left lower limb, including hip: Secondary | ICD-10-CM | POA: Diagnosis not present

## 2017-12-29 DIAGNOSIS — L814 Other melanin hyperpigmentation: Secondary | ICD-10-CM | POA: Diagnosis not present

## 2017-12-29 DIAGNOSIS — Z85828 Personal history of other malignant neoplasm of skin: Secondary | ICD-10-CM | POA: Diagnosis not present

## 2017-12-29 DIAGNOSIS — D225 Melanocytic nevi of trunk: Secondary | ICD-10-CM | POA: Diagnosis not present

## 2017-12-29 DIAGNOSIS — L570 Actinic keratosis: Secondary | ICD-10-CM | POA: Diagnosis not present

## 2017-12-29 DIAGNOSIS — L853 Xerosis cutis: Secondary | ICD-10-CM | POA: Diagnosis not present

## 2017-12-29 DIAGNOSIS — L720 Epidermal cyst: Secondary | ICD-10-CM | POA: Diagnosis not present

## 2017-12-29 DIAGNOSIS — E538 Deficiency of other specified B group vitamins: Secondary | ICD-10-CM | POA: Diagnosis not present

## 2017-12-29 DIAGNOSIS — D2271 Melanocytic nevi of right lower limb, including hip: Secondary | ICD-10-CM | POA: Diagnosis not present

## 2018-01-05 DIAGNOSIS — E538 Deficiency of other specified B group vitamins: Secondary | ICD-10-CM | POA: Diagnosis not present

## 2018-01-12 DIAGNOSIS — E538 Deficiency of other specified B group vitamins: Secondary | ICD-10-CM | POA: Diagnosis not present

## 2018-01-17 ENCOUNTER — Telehealth (HOSPITAL_COMMUNITY): Payer: Self-pay

## 2018-01-17 NOTE — Telephone Encounter (Signed)
Pt call HR at her work and they require her to go somewhere else for her eval and tx.. NF 01/17/18

## 2018-01-19 DIAGNOSIS — E538 Deficiency of other specified B group vitamins: Secondary | ICD-10-CM | POA: Diagnosis not present

## 2018-01-24 ENCOUNTER — Ambulatory Visit (HOSPITAL_COMMUNITY): Payer: Medicare Other

## 2018-01-25 DIAGNOSIS — M8580 Other specified disorders of bone density and structure, unspecified site: Secondary | ICD-10-CM | POA: Diagnosis not present

## 2018-01-25 DIAGNOSIS — Z1389 Encounter for screening for other disorder: Secondary | ICD-10-CM | POA: Diagnosis not present

## 2018-01-25 DIAGNOSIS — Z7189 Other specified counseling: Secondary | ICD-10-CM | POA: Diagnosis not present

## 2018-01-25 DIAGNOSIS — E538 Deficiency of other specified B group vitamins: Secondary | ICD-10-CM | POA: Diagnosis not present

## 2018-01-25 DIAGNOSIS — L129 Pemphigoid, unspecified: Secondary | ICD-10-CM | POA: Diagnosis not present

## 2018-01-25 DIAGNOSIS — E1121 Type 2 diabetes mellitus with diabetic nephropathy: Secondary | ICD-10-CM | POA: Diagnosis not present

## 2018-01-25 DIAGNOSIS — E7800 Pure hypercholesterolemia, unspecified: Secondary | ICD-10-CM | POA: Diagnosis not present

## 2018-01-25 DIAGNOSIS — Z0000 Encounter for general adult medical examination without abnormal findings: Secondary | ICD-10-CM | POA: Diagnosis not present

## 2018-01-25 DIAGNOSIS — I129 Hypertensive chronic kidney disease with stage 1 through stage 4 chronic kidney disease, or unspecified chronic kidney disease: Secondary | ICD-10-CM | POA: Diagnosis not present

## 2018-01-25 DIAGNOSIS — E669 Obesity, unspecified: Secondary | ICD-10-CM | POA: Diagnosis not present

## 2018-01-25 DIAGNOSIS — N183 Chronic kidney disease, stage 3 (moderate): Secondary | ICD-10-CM | POA: Diagnosis not present

## 2018-01-25 DIAGNOSIS — Z79899 Other long term (current) drug therapy: Secondary | ICD-10-CM | POA: Diagnosis not present

## 2018-02-27 DIAGNOSIS — E538 Deficiency of other specified B group vitamins: Secondary | ICD-10-CM | POA: Diagnosis not present

## 2018-03-08 DIAGNOSIS — I129 Hypertensive chronic kidney disease with stage 1 through stage 4 chronic kidney disease, or unspecified chronic kidney disease: Secondary | ICD-10-CM | POA: Diagnosis not present

## 2018-03-08 DIAGNOSIS — Z23 Encounter for immunization: Secondary | ICD-10-CM | POA: Diagnosis not present

## 2018-03-08 DIAGNOSIS — N183 Chronic kidney disease, stage 3 (moderate): Secondary | ICD-10-CM | POA: Diagnosis not present

## 2018-03-30 DIAGNOSIS — E538 Deficiency of other specified B group vitamins: Secondary | ICD-10-CM | POA: Diagnosis not present

## 2018-04-19 DIAGNOSIS — M1712 Unilateral primary osteoarthritis, left knee: Secondary | ICD-10-CM | POA: Diagnosis not present

## 2018-04-19 DIAGNOSIS — M25562 Pain in left knee: Secondary | ICD-10-CM | POA: Diagnosis not present

## 2018-04-24 DIAGNOSIS — M1712 Unilateral primary osteoarthritis, left knee: Secondary | ICD-10-CM | POA: Diagnosis not present

## 2018-04-24 DIAGNOSIS — M25562 Pain in left knee: Secondary | ICD-10-CM | POA: Diagnosis not present

## 2018-05-01 DIAGNOSIS — E538 Deficiency of other specified B group vitamins: Secondary | ICD-10-CM | POA: Diagnosis not present

## 2018-05-01 DIAGNOSIS — M25562 Pain in left knee: Secondary | ICD-10-CM | POA: Diagnosis not present

## 2018-05-01 DIAGNOSIS — M1712 Unilateral primary osteoarthritis, left knee: Secondary | ICD-10-CM | POA: Diagnosis not present

## 2018-05-08 DIAGNOSIS — M1712 Unilateral primary osteoarthritis, left knee: Secondary | ICD-10-CM | POA: Diagnosis not present

## 2018-05-08 DIAGNOSIS — M25562 Pain in left knee: Secondary | ICD-10-CM | POA: Diagnosis not present

## 2018-05-29 DIAGNOSIS — M1712 Unilateral primary osteoarthritis, left knee: Secondary | ICD-10-CM | POA: Diagnosis not present

## 2018-05-29 DIAGNOSIS — M25562 Pain in left knee: Secondary | ICD-10-CM | POA: Diagnosis not present

## 2018-06-02 DIAGNOSIS — E538 Deficiency of other specified B group vitamins: Secondary | ICD-10-CM | POA: Diagnosis not present

## 2018-07-04 DIAGNOSIS — E538 Deficiency of other specified B group vitamins: Secondary | ICD-10-CM | POA: Diagnosis not present

## 2018-07-27 DIAGNOSIS — E611 Iron deficiency: Secondary | ICD-10-CM | POA: Diagnosis not present

## 2018-07-27 DIAGNOSIS — R197 Diarrhea, unspecified: Secondary | ICD-10-CM | POA: Diagnosis not present

## 2018-07-27 DIAGNOSIS — N183 Chronic kidney disease, stage 3 (moderate): Secondary | ICD-10-CM | POA: Diagnosis not present

## 2018-07-27 DIAGNOSIS — E1121 Type 2 diabetes mellitus with diabetic nephropathy: Secondary | ICD-10-CM | POA: Diagnosis not present

## 2018-07-27 DIAGNOSIS — I129 Hypertensive chronic kidney disease with stage 1 through stage 4 chronic kidney disease, or unspecified chronic kidney disease: Secondary | ICD-10-CM | POA: Diagnosis not present

## 2018-07-27 DIAGNOSIS — Z79899 Other long term (current) drug therapy: Secondary | ICD-10-CM | POA: Diagnosis not present

## 2018-08-03 DIAGNOSIS — J3089 Other allergic rhinitis: Secondary | ICD-10-CM | POA: Diagnosis not present

## 2018-08-03 DIAGNOSIS — E1121 Type 2 diabetes mellitus with diabetic nephropathy: Secondary | ICD-10-CM | POA: Diagnosis not present

## 2018-08-03 DIAGNOSIS — M79604 Pain in right leg: Secondary | ICD-10-CM | POA: Diagnosis not present

## 2018-08-03 DIAGNOSIS — I129 Hypertensive chronic kidney disease with stage 1 through stage 4 chronic kidney disease, or unspecified chronic kidney disease: Secondary | ICD-10-CM | POA: Diagnosis not present

## 2018-08-03 DIAGNOSIS — N183 Chronic kidney disease, stage 3 (moderate): Secondary | ICD-10-CM | POA: Diagnosis not present

## 2018-08-03 DIAGNOSIS — E538 Deficiency of other specified B group vitamins: Secondary | ICD-10-CM | POA: Diagnosis not present

## 2018-08-04 ENCOUNTER — Other Ambulatory Visit: Payer: Self-pay | Admitting: Geriatric Medicine

## 2018-08-04 ENCOUNTER — Ambulatory Visit
Admission: RE | Admit: 2018-08-04 | Discharge: 2018-08-04 | Disposition: A | Discharge status: Home / Self Care | Payer: Medicare HMO | Source: Ambulatory Visit | Attending: Geriatric Medicine | Admitting: Geriatric Medicine

## 2018-08-04 DIAGNOSIS — N189 Chronic kidney disease, unspecified: Secondary | ICD-10-CM | POA: Diagnosis not present

## 2018-08-04 DIAGNOSIS — N183 Chronic kidney disease, stage 3 unspecified: Secondary | ICD-10-CM

## 2018-08-04 DIAGNOSIS — I129 Hypertensive chronic kidney disease with stage 1 through stage 4 chronic kidney disease, or unspecified chronic kidney disease: Secondary | ICD-10-CM

## 2018-08-08 ENCOUNTER — Other Ambulatory Visit: Payer: Self-pay | Admitting: Geriatric Medicine

## 2018-08-08 DIAGNOSIS — N183 Chronic kidney disease, stage 3 unspecified: Secondary | ICD-10-CM

## 2018-08-16 ENCOUNTER — Other Ambulatory Visit: Payer: Self-pay | Admitting: Geriatric Medicine

## 2018-08-16 DIAGNOSIS — Z1231 Encounter for screening mammogram for malignant neoplasm of breast: Secondary | ICD-10-CM

## 2018-08-29 DIAGNOSIS — R809 Proteinuria, unspecified: Secondary | ICD-10-CM | POA: Diagnosis not present

## 2018-08-29 DIAGNOSIS — N184 Chronic kidney disease, stage 4 (severe): Secondary | ICD-10-CM | POA: Diagnosis not present

## 2018-08-29 DIAGNOSIS — R609 Edema, unspecified: Secondary | ICD-10-CM | POA: Diagnosis not present

## 2018-08-29 DIAGNOSIS — N281 Cyst of kidney, acquired: Secondary | ICD-10-CM | POA: Diagnosis not present

## 2018-08-29 DIAGNOSIS — R350 Frequency of micturition: Secondary | ICD-10-CM | POA: Diagnosis not present

## 2018-08-29 DIAGNOSIS — I129 Hypertensive chronic kidney disease with stage 1 through stage 4 chronic kidney disease, or unspecified chronic kidney disease: Secondary | ICD-10-CM | POA: Diagnosis not present

## 2018-08-29 DIAGNOSIS — E1122 Type 2 diabetes mellitus with diabetic chronic kidney disease: Secondary | ICD-10-CM | POA: Diagnosis not present

## 2018-09-04 DIAGNOSIS — E538 Deficiency of other specified B group vitamins: Secondary | ICD-10-CM | POA: Diagnosis not present

## 2018-09-12 DIAGNOSIS — E1121 Type 2 diabetes mellitus with diabetic nephropathy: Secondary | ICD-10-CM | POA: Diagnosis not present

## 2018-09-12 DIAGNOSIS — Z7984 Long term (current) use of oral hypoglycemic drugs: Secondary | ICD-10-CM | POA: Diagnosis not present

## 2018-09-12 DIAGNOSIS — I129 Hypertensive chronic kidney disease with stage 1 through stage 4 chronic kidney disease, or unspecified chronic kidney disease: Secondary | ICD-10-CM | POA: Diagnosis not present

## 2018-09-12 DIAGNOSIS — N183 Chronic kidney disease, stage 3 (moderate): Secondary | ICD-10-CM | POA: Diagnosis not present

## 2018-10-05 ENCOUNTER — Ambulatory Visit: Payer: PRIVATE HEALTH INSURANCE

## 2018-10-05 DIAGNOSIS — N183 Chronic kidney disease, stage 3 (moderate): Secondary | ICD-10-CM | POA: Diagnosis not present

## 2018-10-05 DIAGNOSIS — M25511 Pain in right shoulder: Secondary | ICD-10-CM | POA: Diagnosis not present

## 2018-10-05 DIAGNOSIS — E538 Deficiency of other specified B group vitamins: Secondary | ICD-10-CM | POA: Diagnosis not present

## 2018-10-05 DIAGNOSIS — I129 Hypertensive chronic kidney disease with stage 1 through stage 4 chronic kidney disease, or unspecified chronic kidney disease: Secondary | ICD-10-CM | POA: Diagnosis not present

## 2018-10-09 DIAGNOSIS — M25511 Pain in right shoulder: Secondary | ICD-10-CM | POA: Diagnosis not present

## 2018-10-09 DIAGNOSIS — M7541 Impingement syndrome of right shoulder: Secondary | ICD-10-CM | POA: Diagnosis not present

## 2018-10-25 DIAGNOSIS — N184 Chronic kidney disease, stage 4 (severe): Secondary | ICD-10-CM | POA: Diagnosis not present

## 2018-10-25 DIAGNOSIS — R809 Proteinuria, unspecified: Secondary | ICD-10-CM | POA: Diagnosis not present

## 2018-10-25 DIAGNOSIS — N281 Cyst of kidney, acquired: Secondary | ICD-10-CM | POA: Diagnosis not present

## 2018-10-25 DIAGNOSIS — I129 Hypertensive chronic kidney disease with stage 1 through stage 4 chronic kidney disease, or unspecified chronic kidney disease: Secondary | ICD-10-CM | POA: Diagnosis not present

## 2018-10-25 DIAGNOSIS — R609 Edema, unspecified: Secondary | ICD-10-CM | POA: Diagnosis not present

## 2018-10-25 DIAGNOSIS — E1122 Type 2 diabetes mellitus with diabetic chronic kidney disease: Secondary | ICD-10-CM | POA: Diagnosis not present

## 2018-10-26 DIAGNOSIS — N184 Chronic kidney disease, stage 4 (severe): Secondary | ICD-10-CM | POA: Diagnosis not present

## 2018-11-01 ENCOUNTER — Telehealth: Payer: Self-pay | Admitting: Hematology

## 2018-11-01 DIAGNOSIS — N184 Chronic kidney disease, stage 4 (severe): Secondary | ICD-10-CM | POA: Diagnosis not present

## 2018-11-01 NOTE — Telephone Encounter (Signed)
Received a referral from Dr. Lajean Manes for the pt to be seen for hypercalcemia and abnormal free kappa ratio. Pt has been cld and scheduled to see Dr. Irene Limbo on 6/18 at 10am. Pt aware to arrive 20 minutes early.

## 2018-11-06 ENCOUNTER — Other Ambulatory Visit: Payer: Self-pay

## 2018-11-06 ENCOUNTER — Ambulatory Visit
Admission: RE | Admit: 2018-11-06 | Discharge: 2018-11-06 | Disposition: A | Discharge status: Home / Self Care | Payer: Medicare HMO | Source: Ambulatory Visit | Attending: Geriatric Medicine | Admitting: Geriatric Medicine

## 2018-11-06 DIAGNOSIS — Z1231 Encounter for screening mammogram for malignant neoplasm of breast: Secondary | ICD-10-CM

## 2018-11-06 DIAGNOSIS — E538 Deficiency of other specified B group vitamins: Secondary | ICD-10-CM | POA: Diagnosis not present

## 2018-11-06 DIAGNOSIS — M7541 Impingement syndrome of right shoulder: Secondary | ICD-10-CM | POA: Diagnosis not present

## 2018-11-15 NOTE — Progress Notes (Signed)
HEMATOLOGY/ONCOLOGY CONSULTATION NOTE  Date of Service: 11/16/2018  Patient Care Team: Lajean Manes, MD as PCP - General (Internal Medicine)  CHIEF COMPLAINTS/PURPOSE OF CONSULTATION:  Elevated Kappa Light Chains  HISTORY OF PRESENTING ILLNESS:   Beth Malone is a wonderful 73 y.o. female who has been referred to [place] by Dr. Lajean Manes for evaluation and management of Elevated Serum free light chains. The pt reports that she is doing well overall.  The pt reports that she has CKD for a while and sees Dr. Jannifer Hick at [facility] for the last two months. The pt notes that her kidney function worsened in the last 6 weeks. She recently changed from Losartan to "another medication." The pt notes that her nephrologist has discussed the necessity to rule out multiple myeloma with the pt. She notes that she was recently told to stop taking 2000 units Vitamin D replacement, which she began taking after she previously broker her femur. Pt denies taking osteoporosis medications.   The pt is also taking monthly Tony Vitamin B12 for the last year with her PCP. She felt "exhausted" before she began B12 replacement, which improved with replacement, but notes that she has begun feeling very tired again and denies any associations with this. She feels that her fatigue worsened again about 6 months ago and endorses progression. The pt feels that she has developed some new lower back pain about 4 months ago, exacerbated with bending. The pt denies fevers, chills, night sweats, and unexpected weight loss.  The pt notes that her DM has been well controlled in general, takes 1/2 Metformin tab BID. Pt also takes tumeric daily.  Most recent lab results (07/27/18) of CBC is as follows: all values are WNL except for RBC at 3.43, HGB at 9.8, HCT at 29.7, Lymphs at 3.30k. 08/03/18 BMP revealed all values WNL except for Glucose at 106, Creatinine at 2.13, GFR at 23, BUN at 36, and Calcium at 10.5  07/27/18 Iron Panel revealed 23% iron saturation and Transferrin at 197. Initial Serum Free light chain lab not made available at time of visit.  On review of systems, pt reports progressive fatigue, lower back pain, and denies hip pains, new bone pains, fevers, chills, night sweats, unexpected weight loss, abdominal pains, pain along the spine, and any other symptoms.  On PMHx the pt reports Stage IV CKD, DM type II, diabetic neuropathy, osteopenia, obesity, B12 deficiency, HTN, osteoarthritis. On Social Hx the pt reports that she has never smoked cigarettes. Reports infrequent alcohol consumption. Previous worked for the [employer] for state of [place]. On Family Hx the pt reports mother with MI at age 69 and father with heart attack at age 24, denies blood disorders or cancers   MEDICAL HISTORY:  Past Medical History:  Diagnosis Date  . Anxiety   . Diabetes mellitus   . Dysphagia    last year or so   . Femur fracture ([place])   . Hypertension     SURGICAL HISTORY: Past Surgical History:  Procedure Laterality Date  . ABDOMINAL HYSTERECTOMY     complete  . BALLOON DILATION N/A 03/22/2016   Procedure: BALLOON DILATION;  Surgeon: [name], MD;  Location: [facility];  Service: Endoscopy;  Laterality: N/A;  . colonscopy  2014  . ESOPHAGEAL MANOMETRY N/A 04/05/2016   Procedure: ESOPHAGEAL MANOMETRY (EM);  Surgeon: [name], MD;  Location: [facility];  Service: Endoscopy;  Laterality: N/A;  . ESOPHAGOGASTRODUODENOSCOPY (EGD) WITH PROPOFOL N/A 03/22/2016  Procedure: ESOPHAGOGASTRODUODENOSCOPY (EGD) WITH PROPOFOL;  Surgeon: [name], MD;  Location: [facility];  Service: Endoscopy;  Laterality: N/A;  . ESOPHAGOGASTRODUODENOSCOPY (EGD) WITH PROPOFOL N/A 06/01/2016   Procedure: ESOPHAGOGASTRODUODENOSCOPY (EGD) WITH PROPOFOL;  Surgeon: [name], MD;  Location: [facility];  Service: Endoscopy;  Laterality: N/A;  . FEMUR SURGERY Right 2011   rod placed    SOCIAL  HISTORY: Social History   Socioeconomic History  . Marital status: Single    Spouse name: Not on file  . Number of children: Not on file  . Years of education: Not on file  . Highest education level: Not on file  Occupational History  . Not on file  Social Needs  . Financial resource strain: Not on file  . Food insecurity    Worry: Not on file    Inability: Not on file  . Transportation needs    Medical: Not on file    Non-medical: Not on file  Tobacco Use  . Smoking status: Never Smoker  . Smokeless tobacco: Never Used  Substance and Sexual Activity  . Alcohol use: Yes    Comment: rarely drink wine  . Drug use: No  . Sexual activity: Not on file  Lifestyle  . Physical activity    Days per week: Not on file    Minutes per session: Not on file  . Stress: Not on file  Relationships  . Social Herbalist on phone: Not on file    Gets together: Not on file    Attends religious service: Not on file    Active member of club or organization: Not on file    Attends meetings of clubs or organizations: Not on file    Relationship status: Not on file  . Intimate partner violence    Fear of current or ex partner: Not on file    Emotionally abused: Not on file    Physically abused: Not on file    Forced sexual activity: Not on file  Other Topics Concern  . Not on file  Social History Narrative  . Not on file    FAMILY HISTORY: Family History  Family history unknown: Yes    ALLERGIES:  is allergic to augmentin [amoxicillin-pot clavulanate] and codeine.  MEDICATIONS:  Current Outpatient Medications  Medication Sig Dispense Refill  . ALPRAZolam (XANAX) 0.25 MG tablet Take 0.25 mg by mouth daily as needed for anxiety. anxiety     . amLODipine (NORVASC) 10 MG tablet Take 10 mg by mouth daily.    Marland Kitchen amLODipine-benazepril (LOTREL) 10-40 MG per capsule Take 1 capsule by mouth daily.    Marland Kitchen aspirin 81 MG chewable tablet Chew 81 mg by mouth daily.     Marland Kitchen atorvastatin  (LIPITOR) 10 MG tablet Take 10 mg by mouth at bedtime.     . benzonatate (TESSALON) 200 MG capsule Take 200 mg by mouth 3 (three) times daily as needed for cough.    . cetirizine (ZYRTEC) 10 MG tablet Take 10 mg by mouth daily.    . Cholecalciferol (VITAMIN D) 2000 units CAPS Take 4,000 Units by mouth 2 (two) times daily.    . fluticasone (FLONASE) 50 MCG/ACT nasal spray Place 1 spray into both nostrils daily as needed for allergies or rhinitis.    Marland Kitchen losartan (COZAAR) 50 MG tablet Take 50 mg by mouth daily.    . metFORMIN (GLUCOPHAGE) 1000 MG tablet Take 1,000 mg by mouth 2 (two) times daily.  No current facility-administered medications for this visit.     REVIEW OF SYSTEMS:    10 Point review of Systems was done is negative except as noted above.  PHYSICAL EXAMINATION: ECOG PERFORMANCE STATUS: 2  . Vitals:   11/16/18 1021  BP: (!) 147/79  Pulse: 98  Resp: 18  Temp: 98.5 F (36.9 C)  SpO2: 94%   Filed Weights   11/16/18 1021  Weight: 178 lb 11.2 oz (81.1 kg)   .Body mass index is 30.67 kg/m.  GENERAL:alert, in no acute distress and comfortable SKIN: no acute rashes, no significant lesions EYES: conjunctiva are pink and non-injected, sclera anicteric OROPHARYNX: MMM, no exudates, no oropharyngeal erythema or ulceration NECK: supple, no JVD LYMPH:  no palpable lymphadenopathy in the cervical, axillary or inguinal regions LUNGS: clear to auscultation b/l with normal respiratory effort HEART: regular rate & rhythm ABDOMEN:  normoactive bowel sounds , non tender, not distended. No palpable hepatosplenomegaly. Extremity: trace pedal edema PSYCH: alert & oriented x 3 with fluent speech NEURO: no focal motor/sensory deficits  LABORATORY DATA:  I have reviewed the data as listed  . CBC Latest Ref Rng & Units 01/13/2011 01/08/2011 05/29/2010  WBC 4.0 - 10.5 K/uL 10.5 10.9(H) 10.3  Hemoglobin 12.0 - 15.0 g/dL 9.0(L) 13.1 8.4(L)  Hematocrit 36.0 - 46.0 % 27.5(L) 39.9  26.6(L)  Platelets 150 - 400 K/uL 188 234 342    . CMP Latest Ref Rng & Units 01/13/2011 01/08/2011 05/26/2010  Glucose 70 - 99 mg/dL 126(H) 118(H) 128(H)  BUN 6 - 23 mg/dL _0 Creatinine 0.50 - 1.10 mg/dL 0.71 0.80 0.78  Sodium 135 - 145 mEq/L 137 141 140  Potassium 3.5 - 5.1 mEq/L 3.5 4.2 3.7  Chloride 96 - 112 mEq/L 103 102 99  CO2 19 - 32 mEq/L _1 Calcium 8.4 - 10.5 mg/dL 8.1(L) 10.3 8.6  Total Protein 6.0 - 8.3 g/dL - - 5.6(L)  Total Bilirubin 0.3 - 1.2 mg/dL - - 1.4(H)  Alkaline Phos 39 - 117 U/L - - 52  AST 0 - 37 U/L - - 21  ALT 0 - 35 U/L - - 18    08/03/18 BMP:    07/27/18 Iron Panel, CMP, and CBC w/diff:      RADIOGRAPHIC STUDIES: I have personally reviewed the radiological images as listed and agreed with the findings in the report. Mm 3d Screen Breast Bilateral  Result Date: 11/06/2018 CLINICAL DATA:  Screening. EXAM: DIGITAL SCREENING BILATERAL MAMMOGRAM WITH TOMO AND CAD COMPARISON:  Previous exam(s). ACR Breast Density Category b: There are scattered areas of fibroglandular density. FINDINGS: There are no findings suspicious for malignancy. Images were processed with CAD. IMPRESSION: No mammographic evidence of malignancy. A result letter of this screening mammogram will be mailed directly to the patient. RECOMMENDATION: Screening mammogram in one year. (Code:SM-B-01Y) BI-RADS CATEGORY  1: Negative. Electronically Signed   By: Lajean Manes M.D.   On: 11/06/2018 15:00    ASSESSMENT & PLAN:  73 y.o. female with  1. Elevated Serum Free Light Chains PLAN -Discussed patient's most recent labs, 07/27/18 HGB at 9.8. 08/03/18 Creatinine at 2.13, Calcium at 10.5.  -Recent kidney function change in last 6 weeks reported by pt. Pt also reports having elevated serum free light chains, however these labs were not made available at the time of our visit.  -Discussed the purpose and indications of the recommended work up, and to rule out a plasma cell dyscrasia  -Will order 24hr  UPEP  -Will order blood tests today (as noted below) -Will order Bone Survey -Will see the pt back in 3 weeks   Labs today Whole bone survey in 5-7 days RTC with Dr Irene Limbo in 3 weeks  . Orders Placed This Encounter  Procedures  . DG Bone Survey Met    Standing Status:   Future    Standing Expiration Date:   01/16/2020    Order Specific Question:   Reason for Exam (SYMPTOM  OR DIAGNOSIS REQUIRED)    Answer:   staging possible myeloma    Order Specific Question:   Preferred imaging location?    Answer:   [facility]  . CBC with Differential/Platelet    Standing Status:   Future    Number of Occurrences:   1    Standing Expiration Date:   12/21/2019  . CMP ([facility] only)    Standing Status:   Future    Number of Occurrences:   1    Standing Expiration Date:   11/16/2019  . Multiple Myeloma Panel (SPEP&IFE w/QIG)    Standing Status:   Future    Number of Occurrences:   1    Standing Expiration Date:   11/16/2019  . Kappa/lambda light chains    Standing Status:   Future    Number of Occurrences:   1    Standing Expiration Date:   12/21/2019  . Sedimentation rate    Standing Status:   Future    Number of Occurrences:   1    Standing Expiration Date:   11/16/2019  . Beta 2 microglobulin, serum    Standing Status:   Future    Number of Occurrences:   1    Standing Expiration Date:   11/16/2019  . Lactate dehydrogenase    Standing Status:   Future    Number of Occurrences:   1    Standing Expiration Date:   11/16/2019  . PTH, intact and calcium    Standing Status:   Future    Number of Occurrences:   1    Standing Expiration Date:   11/16/2019  . 24-Hr Ur UPEP/UIFE/Light Chains/TP    Standing Status:   Future    Standing Expiration Date:   11/16/2019     All of the patients questions were answered with apparent satisfaction. The patient knows to call the clinic with any problems, questions or concerns.  The total time spent in the appt was 40  minutes and more than 50% was on counseling and direct patient cares.    Sullivan Lone MD MS AAHIVMS [facility] Hematology/Oncology Physician [facility]  (Office):       [phone] (Work cell):  [phone] (Fax):           [phone]  11/16/2018 10:57 AM  I, Baldwin Jamaica, am acting as a scribe for Dr. Sullivan Lone.   .I have reviewed the above documentation for accuracy and completeness, and I agree with the above. Brunetta Genera MD

## 2018-11-16 ENCOUNTER — Other Ambulatory Visit: Payer: Self-pay

## 2018-11-16 ENCOUNTER — Inpatient Hospital Stay: Payer: Medicare HMO | Attending: Hematology | Admitting: Hematology

## 2018-11-16 ENCOUNTER — Inpatient Hospital Stay: Payer: Medicare HMO

## 2018-11-16 ENCOUNTER — Telehealth: Payer: Self-pay | Admitting: Hematology

## 2018-11-16 VITALS — BP 147/79 | HR 98 | Temp 98.5°F | Resp 18 | Ht 64.0 in | Wt 178.7 lb

## 2018-11-16 DIAGNOSIS — M545 Low back pain: Secondary | ICD-10-CM

## 2018-11-16 DIAGNOSIS — D472 Monoclonal gammopathy: Secondary | ICD-10-CM

## 2018-11-16 DIAGNOSIS — E669 Obesity, unspecified: Secondary | ICD-10-CM | POA: Insufficient documentation

## 2018-11-16 DIAGNOSIS — E1122 Type 2 diabetes mellitus with diabetic chronic kidney disease: Secondary | ICD-10-CM | POA: Diagnosis not present

## 2018-11-16 DIAGNOSIS — E1140 Type 2 diabetes mellitus with diabetic neuropathy, unspecified: Secondary | ICD-10-CM | POA: Diagnosis not present

## 2018-11-16 DIAGNOSIS — Z7984 Long term (current) use of oral hypoglycemic drugs: Secondary | ICD-10-CM

## 2018-11-16 DIAGNOSIS — R779 Abnormality of plasma protein, unspecified: Secondary | ICD-10-CM | POA: Diagnosis not present

## 2018-11-16 DIAGNOSIS — N184 Chronic kidney disease, stage 4 (severe): Secondary | ICD-10-CM

## 2018-11-16 DIAGNOSIS — E538 Deficiency of other specified B group vitamins: Secondary | ICD-10-CM | POA: Diagnosis not present

## 2018-11-16 DIAGNOSIS — I129 Hypertensive chronic kidney disease with stage 1 through stage 4 chronic kidney disease, or unspecified chronic kidney disease: Secondary | ICD-10-CM

## 2018-11-16 DIAGNOSIS — M8580 Other specified disorders of bone density and structure, unspecified site: Secondary | ICD-10-CM | POA: Diagnosis not present

## 2018-11-16 DIAGNOSIS — R5383 Other fatigue: Secondary | ICD-10-CM

## 2018-11-16 LAB — CMP (CANCER CENTER ONLY)
ALT: 14 U/L (ref 0–44)
AST: 14 U/L — ABNORMAL LOW (ref 15–41)
Albumin: 4.1 g/dL (ref 3.5–5.0)
Alkaline Phosphatase: 71 U/L (ref 38–126)
Anion gap: 9 (ref 5–15)
BUN: 27 mg/dL — ABNORMAL HIGH (ref 8–23)
CO2: 21 mmol/L — ABNORMAL LOW (ref 22–32)
Calcium: 10.5 mg/dL — ABNORMAL HIGH (ref 8.9–10.3)
Chloride: 112 mmol/L — ABNORMAL HIGH (ref 98–111)
Creatinine: 2.34 mg/dL — ABNORMAL HIGH (ref 0.44–1.00)
GFR, Est AFR Am: 23 mL/min — ABNORMAL LOW (ref >60)
GFR, Estimated: 20 mL/min — ABNORMAL LOW (ref >60)
Glucose, Bld: 93 mg/dL (ref 70–99)
Potassium: 4 mmol/L (ref 3.5–5.1)
Sodium: 142 mmol/L (ref 135–145)
Total Bilirubin: 0.4 mg/dL (ref 0.3–1.2)
Total Protein: 7.6 g/dL (ref 6.5–8.1)

## 2018-11-16 LAB — CBC WITH DIFFERENTIAL/PLATELET
Abs Immature Granulocytes: 0.04 10*3/uL (ref 0.00–0.07)
Basophils Absolute: 0.1 10*3/uL (ref 0.0–0.1)
Basophils Relative: 1 %
Eosinophils Absolute: 0.2 10*3/uL (ref 0.0–0.5)
Eosinophils Relative: 2 %
HCT: 31.7 % — ABNORMAL LOW (ref 36.0–46.0)
Hemoglobin: 9.7 g/dL — ABNORMAL LOW (ref 12.0–15.0)
Immature Granulocytes: 0 %
Lymphocytes Relative: 33 %
Lymphs Abs: 3.8 10*3/uL (ref 0.7–4.0)
MCH: 28.4 pg (ref 26.0–34.0)
MCHC: 30.6 g/dL (ref 30.0–36.0)
MCV: 92.7 fL (ref 80.0–100.0)
Monocytes Absolute: 0.7 10*3/uL (ref 0.1–1.0)
Monocytes Relative: 6 %
Neutro Abs: 6.8 10*3/uL (ref 1.7–7.7)
Neutrophils Relative %: 58 %
Platelets: 258 10*3/uL (ref 150–400)
RBC: 3.42 MIL/uL — ABNORMAL LOW (ref 3.87–5.11)
RDW: 13.8 % (ref 11.5–15.5)
WBC: 11.5 10*3/uL — ABNORMAL HIGH (ref 4.0–10.5)
nRBC: 0 % (ref 0.0–0.2)

## 2018-11-16 LAB — LACTATE DEHYDROGENASE: LDH: 165 U/L (ref 98–192)

## 2018-11-16 LAB — SEDIMENTATION RATE: Sed Rate: 60 mm/hr — ABNORMAL HIGH (ref 0–22)

## 2018-11-16 NOTE — Patient Instructions (Signed)
Thank you for choosing [facility] to provide your oncology and hematology care.   Should you have questions after your visit to the [facility] ([facility]), please contact this office at [phone] between 8:30 AM and 4:30 PM. Voicemails left after 4:00 PM may not be returned until the following business day. Calls received after 4:30 PM will be answered by an off-site Nurse Triage Line.    Prescription Refills:  Please have your pharmacy contact us directly for most prescription requests.  Contact the office directly for refills of narcotics (pain medications). Allow 48-72 hours for refills.  Appointments: Please contact the [facility] scheduling department [phone] for questions regarding [facility] appointment scheduling.  Contact the schedulers with any scheduling changes so that your appointment can be rescheduled in a timely manner.   Central Scheduling for [facility] [phone] - Call to schedule procedures such as PET scans, CT scans, MRI, Ultrasound, etc.  To afford each patient quality time with our providers, please arrive 30 minutes before your scheduled appointment time.  If you arrive late for your appointment, you may be asked to reschedule.  We strive to give you quality time with our providers, and arriving late affects you and other patients whose appointments are after yours. If you are a no show for multiple scheduled visits, you may be dismissed from the clinic at the providers discretion.     Resources: [facility] Social Workers [phone] for additional information on assistance programs --Anne Cunningham/Abigail Elmore  [place] DSS  [phone]: Information regarding food stamps, Medicaid, and utility assistance [employer] [phone]   [place] [employer]'s shared-ride transportation service for eligible riders who have a disability that prevents them from riding the fixed route bus.   Medicare Rights Center 800-333-4114 Helps people with  Medicare understand their rights and benefits, navigate the Medicare system, and secure the quality healthcare they deserve [employer] 800-227-2345 Assists patients locate various types of support and financial assistance Cancer Care: 1-800-813-HOPE (4673) Provides financial assistance, online support groups, medication/co-pay assistance.      

## 2018-11-16 NOTE — Telephone Encounter (Signed)
Scheduled appt per los. Gave avs and calendar

## 2018-11-17 LAB — BETA 2 MICROGLOBULIN, SERUM: Beta-2 Microglobulin: 6.2 mg/L — ABNORMAL HIGH (ref 0.6–2.4)

## 2018-11-17 LAB — PTH, INTACT AND CALCIUM
Calcium, Total (PTH): 10.7 mg/dL — ABNORMAL HIGH (ref 8.7–10.3)
PTH: 7 pg/mL — ABNORMAL LOW (ref 15–65)

## 2018-11-17 LAB — KAPPA/LAMBDA LIGHT CHAINS
Kappa free light chain: 80.3 mg/L — ABNORMAL HIGH (ref 3.3–19.4)
Kappa, lambda light chain ratio: 2.25 — ABNORMAL HIGH (ref 0.26–1.65)
Lambda free light chains: 35.7 mg/L — ABNORMAL HIGH (ref 5.7–26.3)

## 2018-11-20 DIAGNOSIS — H524 Presbyopia: Secondary | ICD-10-CM | POA: Diagnosis not present

## 2018-11-20 LAB — MULTIPLE MYELOMA PANEL, SERUM
Albumin SerPl Elph-Mcnc: 3.8 g/dL (ref 2.9–4.4)
Albumin/Glob SerPl: 1.2 (ref 0.7–1.7)
Alpha 1: 0.2 g/dL (ref 0.0–0.4)
Alpha2 Glob SerPl Elph-Mcnc: 1.1 g/dL — ABNORMAL HIGH (ref 0.4–1.0)
B-Globulin SerPl Elph-Mcnc: 0.9 g/dL (ref 0.7–1.3)
Gamma Glob SerPl Elph-Mcnc: 1.1 g/dL (ref 0.4–1.8)
Globulin, Total: 3.4 g/dL (ref 2.2–3.9)
IgA: 149 mg/dL (ref 64–422)
IgG (Immunoglobin G), Serum: 1167 mg/dL (ref 586–1602)
IgM (Immunoglobulin M), Srm: 101 mg/dL (ref 26–217)
Total Protein ELP: 7.2 g/dL (ref 6.0–8.5)

## 2018-11-22 ENCOUNTER — Ambulatory Visit (HOSPITAL_COMMUNITY)
Admission: RE | Admit: 2018-11-22 | Discharge: 2018-11-22 | Disposition: A | Discharge status: Home / Self Care | Payer: Medicare HMO | Source: Ambulatory Visit | Attending: Hematology | Admitting: Hematology

## 2018-11-22 ENCOUNTER — Other Ambulatory Visit: Payer: Self-pay

## 2018-11-22 DIAGNOSIS — E669 Obesity, unspecified: Secondary | ICD-10-CM | POA: Diagnosis not present

## 2018-11-22 DIAGNOSIS — M47812 Spondylosis without myelopathy or radiculopathy, cervical region: Secondary | ICD-10-CM | POA: Diagnosis not present

## 2018-11-22 DIAGNOSIS — R779 Abnormality of plasma protein, unspecified: Secondary | ICD-10-CM | POA: Diagnosis not present

## 2018-11-22 DIAGNOSIS — M545 Low back pain: Secondary | ICD-10-CM | POA: Diagnosis not present

## 2018-11-22 DIAGNOSIS — I129 Hypertensive chronic kidney disease with stage 1 through stage 4 chronic kidney disease, or unspecified chronic kidney disease: Secondary | ICD-10-CM | POA: Diagnosis not present

## 2018-11-22 DIAGNOSIS — E1140 Type 2 diabetes mellitus with diabetic neuropathy, unspecified: Secondary | ICD-10-CM | POA: Diagnosis not present

## 2018-11-22 DIAGNOSIS — N184 Chronic kidney disease, stage 4 (severe): Secondary | ICD-10-CM | POA: Diagnosis not present

## 2018-11-22 DIAGNOSIS — M47814 Spondylosis without myelopathy or radiculopathy, thoracic region: Secondary | ICD-10-CM | POA: Diagnosis not present

## 2018-11-22 DIAGNOSIS — D472 Monoclonal gammopathy: Secondary | ICD-10-CM | POA: Diagnosis not present

## 2018-11-22 DIAGNOSIS — M8580 Other specified disorders of bone density and structure, unspecified site: Secondary | ICD-10-CM | POA: Diagnosis not present

## 2018-11-22 DIAGNOSIS — E538 Deficiency of other specified B group vitamins: Secondary | ICD-10-CM | POA: Diagnosis not present

## 2018-11-22 DIAGNOSIS — E1122 Type 2 diabetes mellitus with diabetic chronic kidney disease: Secondary | ICD-10-CM | POA: Diagnosis not present

## 2018-11-22 DIAGNOSIS — R5383 Other fatigue: Secondary | ICD-10-CM | POA: Diagnosis not present

## 2018-11-24 LAB — UPEP/UIFE/LIGHT CHAINS/TP, 24-HR UR
% BETA, Urine: 21.8 %
ALPHA 1 URINE: 4.5 %
Albumin, U: 27 %
Alpha 2, Urine: 17.7 %
Free Kappa Lt Chains,Ur: 148.79 mg/L — ABNORMAL HIGH (ref 0.63–113.79)
Free Kappa/Lambda Ratio: 11.34 (ref 1.03–31.76)
Free Lambda Lt Chains,Ur: 13.12 mg/L — ABNORMAL HIGH (ref 0.47–11.77)
GAMMA GLOBULIN URINE: 29.1 %
Total Protein, Urine-Ur/day: 146 mg/24 hr (ref 30–150)
Total Protein, Urine: 9.7 mg/dL
Total Volume: 1500

## 2018-12-06 NOTE — Progress Notes (Signed)
HEMATOLOGY/ONCOLOGY CLINIC NOTE  Date of Service: 12/07/2018  Patient Care Team: Lajean Manes, MD as PCP - General (Internal Medicine)  Dr. Jannifer Hick as Nephrologist  CHIEF COMPLAINTS/PURPOSE OF CONSULTATION:  Elevated Kappa Light Chains  HISTORY OF PRESENTING ILLNESS:   Beth Malone is a wonderful 73 y.o. female who has been referred to [place] by Dr. Lajean Manes for evaluation and management of Elevated Serum free light chains. The pt reports that she is doing well overall.  The pt reports that she has CKD for a while and sees Dr. Jannifer Hick at [facility] for the last two months. The pt notes that her kidney function worsened in the last 6 weeks. She recently changed from Losartan to "another medication." The pt notes that her nephrologist has discussed the necessity to rule out multiple myeloma with the pt. She notes that she was recently told to stop taking 2000 units Vitamin D replacement, which she began taking after she previously broker her femur. Pt denies taking osteoporosis medications.   The pt is also taking monthly [place] Vitamin B12 for the last year with her PCP. She felt "exhausted" before she began B12 replacement, which improved with replacement, but notes that she has begun feeling very tired again and denies any associations with this. She feels that her fatigue worsened again about 6 months ago and endorses progression. The pt feels that she has developed some new lower back pain about 4 months ago, exacerbated with bending. The pt denies fevers, chills, night sweats, and unexpected weight loss.  The pt notes that her DM has been well controlled in general, takes 1/2 Metformin tab BID. Pt also takes tumeric daily.  Most recent lab results (07/27/18) of CBC is as follows: all values are WNL except for RBC at 3.43, HGB at 9.8, HCT at 29.7, Lymphs at 3.30k. 08/03/18 BMP revealed all values WNL except for Glucose at 106, Creatinine at 2.13, GFR at 23, BUN at  36, and Calcium at 10.5 07/27/18 Iron Panel revealed 23% iron saturation and Transferrin at 197. Initial Serum Free light chain lab not made available at time of visit.  On review of systems, pt reports progressive fatigue, lower back pain, and denies hip pains, new bone pains, fevers, chills, night sweats, unexpected weight loss, abdominal pains, pain along the spine, and any other symptoms.  On PMHx the pt reports Stage IV CKD, DM type II, diabetic neuropathy, osteopenia, obesity, B12 deficiency, HTN, osteoarthritis. On Social Hx the pt reports that she has never smoked cigarettes. Reports infrequent alcohol consumption. Previous worked for the [employer] for state of [place]. On Family Hx the pt reports mother with MI at age 102 and father with heart attack at age 44, denies blood disorders or cancers  Interval History:   Beth Malone returns today for management and evaluation of her Elevated serum free light chains. The patient's last visit with [place] was on 11/16/18. The pt reports that she is doing well overall.  The pt reports that she has had some increased ankle swelling and is following up with her nephrologist with diuretic management and will call her nephrologist today. She notes that she has decreased her salt consumption in the last week.  The pt has been taking 4000units Vitamin D BID since 2011. She also takes Vitamin C "once in a while."  The pt notes that she "just can't do what she used to do," and notes that she feels tired. She notes that she  has had fatigue for a while and previously pursued B12 injections, which has helped her memory, and only marginally improved her energy levels.   Of note since the patient's last visit, pt has had a Bone Survey completed on 11/23/18 with results revealing "Negative for bony changes of myeloma."  Lab results (11/16/18) of CBC w/diff and CMP is as follows: all values are WNL except for WBC at 11.5k, RBC at 3.42, HGB at 9.7, HCT at 31.7,  Chloride at 112, CO2 at 21, BUN at 27, Creatinine at 2.34, Calcium at 10.5, AST at 14, GFR at 20. 11/16/18 PTH at 7, Calcium at 10.7 11/16/18 MMP revealed all values WNL except for Alpha2 globulins at 1.1 11/16/18 SFLC revealed Kappa light chains at 80.3, Lambda at 35.7 and K:L ratio at 2.25 11/16/18 Sed Rate at 60, LDH at 165, Beta 2 microglobulin at 6.2  On review of systems, pt reports leg swelling, feeling tired, and denies unexpected weight loss, changes in breathing, and any other symptoms.   MEDICAL HISTORY:  Past Medical History:  Diagnosis Date  . Anxiety   . Diabetes mellitus   . Dysphagia    last year or so   . Femur fracture ([place])   . Hypertension     SURGICAL HISTORY: Past Surgical History:  Procedure Laterality Date  . ABDOMINAL HYSTERECTOMY     complete  . BALLOON DILATION N/A 03/22/2016   Procedure: BALLOON DILATION;  Surgeon: [name], MD;  Location: [facility];  Service: Endoscopy;  Laterality: N/A;  . colonscopy  2014  . ESOPHAGEAL MANOMETRY N/A 04/05/2016   Procedure: ESOPHAGEAL MANOMETRY (EM);  Surgeon: [name], MD;  Location: [facility];  Service: Endoscopy;  Laterality: N/A;  . ESOPHAGOGASTRODUODENOSCOPY (EGD) WITH PROPOFOL N/A 03/22/2016   Procedure: ESOPHAGOGASTRODUODENOSCOPY (EGD) WITH PROPOFOL;  Surgeon: [name], MD;  Location: [facility];  Service: Endoscopy;  Laterality: N/A;  . ESOPHAGOGASTRODUODENOSCOPY (EGD) WITH PROPOFOL N/A 06/01/2016   Procedure: ESOPHAGOGASTRODUODENOSCOPY (EGD) WITH PROPOFOL;  Surgeon: [name], MD;  Location: [facility];  Service: Endoscopy;  Laterality: N/A;  . FEMUR SURGERY Right 2011   rod placed    SOCIAL HISTORY: Social History   Socioeconomic History  . Marital status: Single    Spouse name: Not on file  . Number of children: Not on file  . Years of education: Not on file  . Highest education level: Not on file  Occupational History  . Not on file  Social Needs  . Financial  resource strain: Not on file  . Food insecurity    Worry: Not on file    Inability: Not on file  . Transportation needs    Medical: Not on file    Non-medical: Not on file  Tobacco Use  . Smoking status: Never Smoker  . Smokeless tobacco: Never Used  Substance and Sexual Activity  . Alcohol use: Yes    Comment: rarely drink wine  . Drug use: No  . Sexual activity: Not on file  Lifestyle  . Physical activity    Days per week: Not on file    Minutes per session: Not on file  . Stress: Not on file  Relationships  . Social Herbalist on phone: Not on file    Gets together: Not on file    Attends religious service: Not on file    Active member of club or organization: Not on file    Attends meetings of clubs or organizations: Not on  file    Relationship status: Not on file  . Intimate partner violence    Fear of current or ex partner: Not on file    Emotionally abused: Not on file    Physically abused: Not on file    Forced sexual activity: Not on file  Other Topics Concern  . Not on file  Social History Narrative  . Not on file    FAMILY HISTORY: Family History  Family history unknown: Yes    ALLERGIES:  is allergic to augmentin [amoxicillin-pot clavulanate] and codeine.  MEDICATIONS:  Current Outpatient Medications  Medication Sig Dispense Refill  . ALPRAZolam (XANAX) 0.25 MG tablet Take 0.25 mg by mouth daily as needed for anxiety. anxiety     . amLODipine (NORVASC) 10 MG tablet Take 10 mg by mouth daily.    Marland Kitchen aspirin 81 MG chewable tablet Chew 81 mg by mouth daily.     Marland Kitchen atorvastatin (LIPITOR) 20 MG tablet Take 20 mg by mouth at bedtime. Patient takes 20 mg daily at bedtime    . benzonatate (TESSALON) 200 MG capsule Take 200 mg by mouth 3 (three) times daily as needed for cough.    . cetirizine (ZYRTEC) 10 MG tablet Take 10 mg by mouth daily.    . Cholecalciferol (VITAMIN D) 2000 units CAPS Take 4,000 Units by mouth 2 (two) times daily.    .  fluticasone (FLONASE) 50 MCG/ACT nasal spray Place 1 spray into both nostrils daily as needed for allergies or rhinitis.    Marland Kitchen losartan (COZAAR) 50 MG tablet Take 50 mg by mouth daily.    . metFORMIN (GLUCOPHAGE) 1000 MG tablet Take 500 mg by mouth 2 (two) times daily. Takes 1/2 tablet 2 times a day - per patient    . Metoprolol Succinate 25 MG CS24 Take 1 tablet by mouth daily.    . Turmeric 500 MG TABS Take 1 tablet by mouth 3 (three) times daily.     No current facility-administered medications for this visit.     REVIEW OF SYSTEMS:    A 10+ POINT REVIEW OF SYSTEMS WAS OBTAINED including neurology, dermatology, psychiatry, cardiac, respiratory, lymph, extremities, GI, GU, Musculoskeletal, constitutional, breasts, reproductive, HEENT.  All pertinent positives are noted in the HPI.  All others are negative.   PHYSICAL EXAMINATION: ECOG PERFORMANCE STATUS: 2  . Vitals:   12/07/18 1525  BP: (!) 157/97  Pulse: 93  Resp: 18  Temp: 98.5 F (36.9 C)  SpO2: 100%   Filed Weights   12/07/18 1525  Weight: 185 lb 3.2 oz (84 kg)   .Body mass index is 31.79 kg/m.  GENERAL:alert, in no acute distress and comfortable SKIN: no acute rashes, no significant lesions EYES: conjunctiva are pink and non-injected, sclera anicteric OROPHARYNX: MMM, no exudates, no oropharyngeal erythema or ulceration NECK: supple, no JVD LYMPH:  no palpable lymphadenopathy in the cervical, axillary or inguinal regions LUNGS: clear to auscultation b/l with normal respiratory effort HEART: regular rate & rhythm ABDOMEN:  normoactive bowel sounds , non tender, not distended. No palpable hepatosplenomegaly.  Extremity: 2+ pedal edema PSYCH: alert & oriented x 3 with fluent speech NEURO: no focal motor/sensory deficits   LABORATORY DATA:  I have reviewed the data as listed  . CBC Latest Ref Rng & Units 12/08/2018 11/16/2018 01/13/2011  WBC 4.0 - 10.5 K/uL 10.5 11.5(H) 10.5  Hemoglobin 12.0 - 15.0 g/dL 8.8(L)  9.7(L) 9.0(L)  Hematocrit 36.0 - 46.0 % 28.2(L) 31.7(L) 27.5(L)  Platelets 150 - 400 K/uL  244 258 188    . CMP Latest Ref Rng & Units 12/08/2018 11/16/2018 11/16/2018  Glucose 70 - 99 mg/dL 153(H) 93 -  BUN 8 - 23 mg/dL 40(H) 27(H) -  Creatinine 0.44 - 1.00 mg/dL 2.52(H) 2.34(H) -  Sodium 135 - 145 mmol/L 143 142 -  Potassium 3.5 - 5.1 mmol/L 3.5 4.0 -  Chloride 98 - 111 mmol/L 110 112(H) -  CO2 22 - 32 mmol/L 17(L) 21(L) -  Calcium 8.9 - 10.3 mg/dL 10.1 10.5(H) 10.7(H)  Total Protein 6.5 - 8.1 g/dL - 7.6 -  Total Bilirubin 0.3 - 1.2 mg/dL - 0.4 -  Alkaline Phos 38 - 126 U/L - 71 -  AST 15 - 41 U/L - 14(L) -  ALT 0 - 44 U/L - 14 -   . No results found for: IRON, TIBC, IRONPCTSAT (Iron and TIBC)  Lab Results  Component Value Date   FERRITIN 82 12/08/2018    08/03/18 BMP:    07/27/18 Iron Panel, CMP, and CBC w/diff:      RADIOGRAPHIC STUDIES: I have personally reviewed the radiological images as listed and agreed with the findings in the report. Dg Bone Survey Met  Result Date: 11/23/2018 CLINICAL DATA:  Rule out multiple myeloma EXAM: METASTATIC BONE SURVEY COMPARISON:  None. FINDINGS: Negative for lytic lesions of myeloma. Negative for acute fracture Chronic fracture right femur with locking intramedullary rod. Moderate to advanced degenerative change in both knees. Thoracic scoliosis. Disc degeneration and spurring in the cervical and thoracic spine. Disc degeneration L4-5 and L5-S1. Degenerative change in the right wrist with calcific tendinosis distal to the ulna. IMPRESSION: Negative for bony changes of myeloma. Electronically Signed   By: Franchot Gallo M.D.   On: 11/23/2018 07:49    ASSESSMENT & PLAN:  73 y.o. female with  1. Elevated Serum Free Light Chains Labs upon initial presentation from 07/27/18 HGB at 9.8. 08/03/18 Creatinine at 2.13, Calcium at 10.5. Recent kidney function change in 6 weeks prior to initial encounter reported by pt.  Pt also reports having  elevated serum free light chains, however these labs were not made available at the time of our initial visit.   2. Hypercalcemia ? Etiology  3. Anemia of CKD. Do not believe this is related to  PLAN -Discussed pt labwork from 11/16/18; HGB stable at 9.7. Calcium stable at 10.5, PTH appropriately low at 7. Creatinine slightly increased to 2.34. No M Protein. Kappa light chains at 80.3 and K:L ratio at 2.25. Sed rate at 60 and Beta-2 microglobulin at 6.2. LDH normal.  -Discussed the 11/22/18 24hr UPEP which revealed Free kappa light chains at 148.32m and Free lambda light chains at 13.12 -Discussed the 11/23/18 Bone survey which revealed "Negative for bony changes of myeloma." -Discussed that she has had stable anemia with alternative explanations such as her CKD -Recommend Ferritin >100, especially in setting of chronic inflammation, which pt appears to have given elevated inflammatory markers as noted above -Recommend checking Erythropoietin level as well -Discussed that I feel that her increased kappa light chains in the blood can be related to her CKD as well, with decreased excretion  -No Monoclonal paraproteinemia identified -Discussed that her hypercalcemia does not appear to be related to any concern of bone tumors given the absence of findings on the most recent bone survey. Her PTH is appropriately suppressed and pt is retaining fluid so no concern of dehydration.  -Will order CT C/A/P without contrast to rule out tumors or enlarged lymph  nodes which can also cause her hypercalcemia which does not yet have alternative explanations -Recommend further work up of hypercalcemia with PCP -Discussed that the above work up cannot rule out AL Amyloidosis but that this could be ruled out with kidney biopsy if clinical suspicion warrants this. Would defer this to Nephrology. -Will order lasix to continue mx with nephrology -Recommend discussing diuretic management with nephrology -Recommend checking  Vitamin D levels - ordered concern since patient was on vit D 4000 units PO BID for several years. -Will order additional blood tests today to further evaluate her anemia -If her HGB were seen to suddenly change, this could necessitate a BM biopsy -Will see the pt back in 1-2 weeks   Labs today CT chest/abd/pelvis in 1 week Telephone visit in 2 weeks   No orders of the defined types were placed in this encounter.    All of the patients questions were answered with apparent satisfaction. The patient knows to call the clinic with any problems, questions or concerns.  The total time spent in the appt was 25 minutes and more than 50% was on counseling and direct patient cares.    Sullivan Lone MD MS AAHIVMS [facility] Hematology/Oncology Physician [facility]  (Office):       [phone] (Work cell):  [phone] (Fax):           [phone]  12/07/2018 4:05 PM  I, Baldwin Jamaica, am acting as a scribe for Dr. Sullivan Lone.   .I have reviewed the above documentation for accuracy and completeness, and I agree with the above. Brunetta Genera MD

## 2018-12-07 ENCOUNTER — Other Ambulatory Visit: Payer: Self-pay

## 2018-12-07 ENCOUNTER — Inpatient Hospital Stay: Payer: Medicare HMO | Attending: Hematology | Admitting: Hematology

## 2018-12-07 VITALS — BP 157/97 | HR 93 | Temp 98.5°F | Resp 18 | Ht 64.0 in | Wt 185.2 lb

## 2018-12-07 DIAGNOSIS — M8580 Other specified disorders of bone density and structure, unspecified site: Secondary | ICD-10-CM | POA: Diagnosis not present

## 2018-12-07 DIAGNOSIS — E1140 Type 2 diabetes mellitus with diabetic neuropathy, unspecified: Secondary | ICD-10-CM | POA: Diagnosis not present

## 2018-12-07 DIAGNOSIS — R779 Abnormality of plasma protein, unspecified: Secondary | ICD-10-CM

## 2018-12-07 DIAGNOSIS — E538 Deficiency of other specified B group vitamins: Secondary | ICD-10-CM | POA: Insufficient documentation

## 2018-12-07 DIAGNOSIS — N184 Chronic kidney disease, stage 4 (severe): Secondary | ICD-10-CM | POA: Diagnosis not present

## 2018-12-07 DIAGNOSIS — E1122 Type 2 diabetes mellitus with diabetic chronic kidney disease: Secondary | ICD-10-CM | POA: Insufficient documentation

## 2018-12-07 DIAGNOSIS — D649 Anemia, unspecified: Secondary | ICD-10-CM

## 2018-12-07 DIAGNOSIS — I129 Hypertensive chronic kidney disease with stage 1 through stage 4 chronic kidney disease, or unspecified chronic kidney disease: Secondary | ICD-10-CM

## 2018-12-07 DIAGNOSIS — D472 Monoclonal gammopathy: Secondary | ICD-10-CM

## 2018-12-07 DIAGNOSIS — H524 Presbyopia: Secondary | ICD-10-CM | POA: Diagnosis not present

## 2018-12-07 DIAGNOSIS — D631 Anemia in chronic kidney disease: Secondary | ICD-10-CM

## 2018-12-07 DIAGNOSIS — E1121 Type 2 diabetes mellitus with diabetic nephropathy: Secondary | ICD-10-CM | POA: Diagnosis not present

## 2018-12-07 MED ORDER — FUROSEMIDE 20 MG PO TABS: 20.0000 mg | ORAL_TABLET | Freq: Two times a day (BID) | ORAL | 0 refills | Status: DC

## 2018-12-08 ENCOUNTER — Inpatient Hospital Stay: Payer: Medicare HMO

## 2018-12-08 ENCOUNTER — Other Ambulatory Visit: Payer: Self-pay

## 2018-12-08 DIAGNOSIS — D649 Anemia, unspecified: Secondary | ICD-10-CM

## 2018-12-08 DIAGNOSIS — R779 Abnormality of plasma protein, unspecified: Secondary | ICD-10-CM | POA: Diagnosis not present

## 2018-12-08 DIAGNOSIS — N184 Chronic kidney disease, stage 4 (severe): Secondary | ICD-10-CM | POA: Diagnosis not present

## 2018-12-08 DIAGNOSIS — E1140 Type 2 diabetes mellitus with diabetic neuropathy, unspecified: Secondary | ICD-10-CM | POA: Diagnosis not present

## 2018-12-08 DIAGNOSIS — E1122 Type 2 diabetes mellitus with diabetic chronic kidney disease: Secondary | ICD-10-CM | POA: Diagnosis not present

## 2018-12-08 DIAGNOSIS — I129 Hypertensive chronic kidney disease with stage 1 through stage 4 chronic kidney disease, or unspecified chronic kidney disease: Secondary | ICD-10-CM | POA: Diagnosis not present

## 2018-12-08 DIAGNOSIS — M8580 Other specified disorders of bone density and structure, unspecified site: Secondary | ICD-10-CM | POA: Diagnosis not present

## 2018-12-08 DIAGNOSIS — E538 Deficiency of other specified B group vitamins: Secondary | ICD-10-CM | POA: Diagnosis not present

## 2018-12-08 DIAGNOSIS — D631 Anemia in chronic kidney disease: Secondary | ICD-10-CM | POA: Diagnosis not present

## 2018-12-08 LAB — CBC WITH DIFFERENTIAL/PLATELET
Abs Immature Granulocytes: 0.04 10*3/uL (ref 0.00–0.07)
Basophils Absolute: 0.1 10*3/uL (ref 0.0–0.1)
Basophils Relative: 1 %
Eosinophils Absolute: 0.4 10*3/uL (ref 0.0–0.5)
Eosinophils Relative: 4 %
HCT: 28.2 % — ABNORMAL LOW (ref 36.0–46.0)
Hemoglobin: 8.8 g/dL — ABNORMAL LOW (ref 12.0–15.0)
Immature Granulocytes: 0 %
Lymphocytes Relative: 31 %
Lymphs Abs: 3.3 10*3/uL (ref 0.7–4.0)
MCH: 28.3 pg (ref 26.0–34.0)
MCHC: 31.2 g/dL (ref 30.0–36.0)
MCV: 90.7 fL (ref 80.0–100.0)
Monocytes Absolute: 0.7 10*3/uL (ref 0.1–1.0)
Monocytes Relative: 7 %
Neutro Abs: 6.1 10*3/uL (ref 1.7–7.7)
Neutrophils Relative %: 57 %
Platelets: 244 10*3/uL (ref 150–400)
RBC: 3.11 MIL/uL — ABNORMAL LOW (ref 3.87–5.11)
RDW: 14.2 % (ref 11.5–15.5)
WBC: 10.5 10*3/uL (ref 4.0–10.5)
nRBC: 0 % (ref 0.0–0.2)

## 2018-12-08 LAB — FERRITIN: Ferritin: 82 ng/mL (ref 11–307)

## 2018-12-08 LAB — BASIC METABOLIC PANEL
Anion gap: 16 — ABNORMAL HIGH (ref 5–15)
BUN: 40 mg/dL — ABNORMAL HIGH (ref 8–23)
CO2: 17 mmol/L — ABNORMAL LOW (ref 22–32)
Calcium: 10.1 mg/dL (ref 8.9–10.3)
Chloride: 110 mmol/L (ref 98–111)
Creatinine, Ser: 2.52 mg/dL — ABNORMAL HIGH (ref 0.44–1.00)
GFR calc Af Amer: 21 mL/min — ABNORMAL LOW (ref >60)
GFR calc non Af Amer: 18 mL/min — ABNORMAL LOW (ref >60)
Glucose, Bld: 153 mg/dL — ABNORMAL HIGH (ref 70–99)
Potassium: 3.5 mmol/L (ref 3.5–5.1)
Sodium: 143 mmol/L (ref 135–145)

## 2018-12-08 LAB — TSH: TSH: 1.664 u[IU]/mL (ref 0.308–3.960)

## 2018-12-08 LAB — VITAMIN B12: Vitamin B-12: >7500 pg/mL — ABNORMAL HIGH (ref 180–914)

## 2018-12-09 LAB — VITAMIN D 25 HYDROXY (VIT D DEFICIENCY, FRACTURES): Vit D, 25-Hydroxy: 87 ng/mL (ref 30.0–100.0)

## 2018-12-11 LAB — CALCITRIOL (1,25 DI-OH VIT D): Vit D, 1,25-Dihydroxy: 47.7 pg/mL (ref 19.9–79.3)

## 2018-12-12 ENCOUNTER — Other Ambulatory Visit: Payer: Self-pay | Admitting: Hematology

## 2018-12-19 ENCOUNTER — Ambulatory Visit (HOSPITAL_COMMUNITY)
Admission: RE | Admit: 2018-12-19 | Discharge: 2018-12-19 | Disposition: A | Discharge status: Home / Self Care | Payer: Medicare HMO | Source: Ambulatory Visit | Attending: Hematology | Admitting: Hematology

## 2018-12-19 ENCOUNTER — Other Ambulatory Visit: Payer: Self-pay

## 2018-12-19 DIAGNOSIS — M47816 Spondylosis without myelopathy or radiculopathy, lumbar region: Secondary | ICD-10-CM | POA: Diagnosis not present

## 2018-12-19 DIAGNOSIS — N2889 Other specified disorders of kidney and ureter: Secondary | ICD-10-CM | POA: Diagnosis not present

## 2018-12-19 DIAGNOSIS — M5136 Other intervertebral disc degeneration, lumbar region: Secondary | ICD-10-CM | POA: Diagnosis not present

## 2018-12-19 DIAGNOSIS — R809 Proteinuria, unspecified: Secondary | ICD-10-CM | POA: Diagnosis not present

## 2018-12-19 DIAGNOSIS — I700 Atherosclerosis of aorta: Secondary | ICD-10-CM | POA: Diagnosis not present

## 2018-12-19 DIAGNOSIS — N289 Disorder of kidney and ureter, unspecified: Secondary | ICD-10-CM | POA: Insufficient documentation

## 2018-12-19 DIAGNOSIS — K8020 Calculus of gallbladder without cholecystitis without obstruction: Secondary | ICD-10-CM | POA: Insufficient documentation

## 2018-12-19 DIAGNOSIS — D472 Monoclonal gammopathy: Secondary | ICD-10-CM | POA: Diagnosis present

## 2018-12-19 DIAGNOSIS — I313 Pericardial effusion (noninflammatory): Secondary | ICD-10-CM | POA: Insufficient documentation

## 2018-12-20 ENCOUNTER — Telehealth: Payer: Self-pay | Admitting: *Deleted

## 2018-12-20 NOTE — Telephone Encounter (Signed)
Records faxed to [facility] - Release [id number]

## 2018-12-21 ENCOUNTER — Telehealth: Payer: Self-pay | Admitting: Hematology

## 2018-12-21 NOTE — Telephone Encounter (Signed)
Left VM to confirm appt and verify info °

## 2018-12-21 NOTE — Progress Notes (Signed)
HEMATOLOGY/ONCOLOGY CLINIC NOTE  Date of Service: 12/22/2018  Patient Care Team: Lajean Manes, MD as PCP - General (Internal Medicine)  Dr. Jannifer Hick as Nephrologist  CHIEF COMPLAINTS/PURPOSE OF CONSULTATION:  Elevated Kappa Light Chains  HISTORY OF PRESENTING ILLNESS:   Beth Malone is a wonderful 73 y.o. female who has been referred to [place] by Dr. Lajean Manes for evaluation and management of Elevated Serum free light chains. The pt reports that she is doing well overall.  The pt reports that she has CKD for a while and sees Dr. Jannifer Hick at [facility] for the last two months. The pt notes that her kidney function worsened in the last 6 weeks. She recently changed from Losartan to "another medication." The pt notes that her nephrologist has discussed the necessity to rule out multiple myeloma with the pt. She notes that she was recently told to stop taking 2000 units Vitamin D replacement, which she began taking after she previously broker her femur. Pt denies taking osteoporosis medications.   The pt is also taking monthly [place] Vitamin B12 for the last year with her PCP. She felt "exhausted" before she began B12 replacement, which improved with replacement, but notes that she has begun feeling very tired again and denies any associations with this. She feels that her fatigue worsened again about 6 months ago and endorses progression. The pt feels that she has developed some new lower back pain about 4 months ago, exacerbated with bending. The pt denies fevers, chills, night sweats, and unexpected weight loss.  The pt notes that her DM has been well controlled in general, takes 1/2 Metformin tab BID. Pt also takes tumeric daily.  Most recent lab results (07/27/18) of CBC is as follows: all values are WNL except for RBC at 3.43, HGB at 9.8, HCT at 29.7, Lymphs at 3.30k. 08/03/18 BMP revealed all values WNL except for Glucose at 106, Creatinine at 2.13, GFR at 23, BUN at  36, and Calcium at 10.5 07/27/18 Iron Panel revealed 23% iron saturation and Transferrin at 197. Initial Serum Free light chain lab not made available at time of visit.  On review of systems, pt reports progressive fatigue, lower back pain, and denies hip pains, new bone pains, fevers, chills, night sweats, unexpected weight loss, abdominal pains, pain along the spine, and any other symptoms.  On PMHx the pt reports Stage IV CKD, DM type II, diabetic neuropathy, osteopenia, obesity, B12 deficiency, HTN, osteoarthritis. On Social Hx the pt reports that she has never smoked cigarettes. Reports infrequent alcohol consumption. Previous worked for the [employer] for state of [place]. On Family Hx the pt reports mother with MI at age 41 and father with heart attack at age 54, denies blood disorders or cancers  Interval History:   I connected with Beth Malone on 12/22/2018 at  9:00 AM EDT by telephone and verified that I am speaking with the correct person using two identifiers.  I discussed the limitations, risks, security and privacy concerns of performing an evaluation and management service by telemedicine and the availability of in-person appointments. I also discussed with the patient that there may be a patient responsible charge related to this service. The patient expressed understanding and agreed to proceed.   Other persons participating in the visit and their role in the encounter: none  Patient's location: home Provider's location: my office at the [facility] returns today for management and evaluation of her  Elevated serum free light chains. The patient's last visit with [place] was on 12/07/2018. The pt reports that she is doing well overall.  The pt reports no new concerns. She notes that she stopped taking Vit D 4000 units BID after her last visit.  Of note since the patient's last visit, pt has had CT chest, abdomen, and pelvis wo contrast completed  on 12/19/2018 with results revealing "1. No specific worrisome bony lesions to explain the patient's hypercalcemia. There is a suspected hemangioma at L4 and possibly at L2. 2. 2 mm punctate calcification in the vicinity of the left UVJ suspicious for a nonobstructive calculus. No hydronephrosis or Hydroureter. 3. Punctate calcification in the left kidney upper pole is likely along the inferior margin of a cyst. There is also a small peripheral hyperdense lesion in the left kidney upper pole measuring 4 mm in diameter, probably a complex cyst but technically too small to characterize. 4. 2 hypodense lesions in the right kidney are probably cysts based on low-density. 5. Contrast medium in the esophagus suggests motility or reflux. 6. Moderate pericardial effusion.7. Aortic Atherosclerosis (ICD10-I70.0). Cholelithiasis. Lower lumbar impingement due to spondylosis and degenerative disc disease."  Most recent lab results from 12/08/2018 of CBC w/diff and BMP is as follows: all values are WNL except for RBC at 3.11, HGB at 8.8, HCT at 28.2, CO2 at 17, glucose bld at 153, BUN at 40, Creatinine at 2.52, GFR at 18, anion gap at 16. 12/08/2018 TSH at 1.664 12/08/2018 Ferritin at 82 12/08/2018 Vit B12 at >7,500 12/08/2018 Vit D 25 hydroxy at 87 12/08/2018 Vit D 1,25-Dihydroxy at 47.7  On review of systems, pt reports no new concerns and denies any other symptoms.    MEDICAL HISTORY:  Past Medical History:  Diagnosis Date   Anxiety    Diabetes mellitus    Dysphagia    last year or so    Femur fracture ([place])    Hypertension     SURGICAL HISTORY: Past Surgical History:  Procedure Laterality Date   ABDOMINAL HYSTERECTOMY     complete   BALLOON DILATION N/A 03/22/2016   Procedure: BALLOON DILATION;  Surgeon: [name], MD;  Location: [facility];  Service: Endoscopy;  Laterality: N/A;   colonscopy  2014   ESOPHAGEAL MANOMETRY N/A 04/05/2016   Procedure: ESOPHAGEAL MANOMETRY (EM);   Surgeon: [name], MD;  Location: [facility];  Service: Endoscopy;  Laterality: N/A;   ESOPHAGOGASTRODUODENOSCOPY (EGD) WITH PROPOFOL N/A 03/22/2016   Procedure: ESOPHAGOGASTRODUODENOSCOPY (EGD) WITH PROPOFOL;  Surgeon: [name], MD;  Location: [facility];  Service: Endoscopy;  Laterality: N/A;   ESOPHAGOGASTRODUODENOSCOPY (EGD) WITH PROPOFOL N/A 06/01/2016   Procedure: ESOPHAGOGASTRODUODENOSCOPY (EGD) WITH PROPOFOL;  Surgeon: [name], MD;  Location: [facility];  Service: Endoscopy;  Laterality: N/A;   FEMUR SURGERY Right 2011   rod placed    SOCIAL HISTORY: Social History   Socioeconomic History   Marital status: Single    Spouse name: Not on file   Number of children: Not on file   Years of education: Not on file   Highest education level: Not on file  Occupational History   Not on file  Social Needs   Financial resource strain: Not on file   Food insecurity    Worry: Not on file    Inability: Not on file   Transportation needs    Medical: Not on file    Non-medical: Not on file  Tobacco Use   Smoking status: Never  Smoker   Smokeless tobacco: Never Used  Substance and Sexual Activity   Alcohol use: Yes    Comment: rarely drink wine   Drug use: No   Sexual activity: Not on file  Lifestyle   Physical activity    Days per week: Not on file    Minutes per session: Not on file   Stress: Not on file  Relationships   Social connections    Talks on phone: Not on file    Gets together: Not on file    Attends religious service: Not on file    Active member of club or organization: Not on file    Attends meetings of clubs or organizations: Not on file    Relationship status: Not on file   Intimate partner violence    Fear of current or ex partner: Not on file    Emotionally abused: Not on file    Physically abused: Not on file    Forced sexual activity: Not on file  Other Topics Concern   Not on file  Social History  Narrative   Not on file    FAMILY HISTORY: Family History  Family history unknown: Yes    ALLERGIES:  is allergic to augmentin [amoxicillin-pot clavulanate] and codeine.  MEDICATIONS:  Current Outpatient Medications  Medication Sig Dispense Refill   ALPRAZolam (XANAX) 0.25 MG tablet Take 0.25 mg by mouth daily as needed for anxiety. anxiety      amLODipine (NORVASC) 10 MG tablet Take 10 mg by mouth daily.     aspirin 81 MG chewable tablet Chew 81 mg by mouth daily.      atorvastatin (LIPITOR) 20 MG tablet Take 20 mg by mouth at bedtime. Patient takes 20 mg daily at bedtime     benzonatate (TESSALON) 200 MG capsule Take 200 mg by mouth 3 (three) times daily as needed for cough.     cetirizine (ZYRTEC) 10 MG tablet Take 10 mg by mouth daily.     Cholecalciferol (VITAMIN D) 2000 units CAPS Take 4,000 Units by mouth 2 (two) times daily.     fluticasone (FLONASE) 50 MCG/ACT nasal spray Place 1 spray into both nostrils daily as needed for allergies or rhinitis.     furosemide (LASIX) 20 MG tablet Take 1 tablet (20 mg total) by mouth 2 (two) times daily. 30 tablet 0   losartan (COZAAR) 50 MG tablet Take 50 mg by mouth daily.     metFORMIN (GLUCOPHAGE) 1000 MG tablet Take 500 mg by mouth 2 (two) times daily. Takes 1/2 tablet 2 times a day - per patient     Metoprolol Succinate 25 MG CS24 Take 1 tablet by mouth daily.     Turmeric 500 MG TABS Take 1 tablet by mouth 3 (three) times daily.     No current facility-administered medications for this visit.     REVIEW OF SYSTEMS:    A 10+ POINT REVIEW OF SYSTEMS WAS OBTAINED including neurology, dermatology, psychiatry, cardiac, respiratory, lymph, extremities, GI, GU, Musculoskeletal, constitutional, breasts, reproductive, HEENT.  All pertinent positives are noted in the HPI.  All others are negative.   PHYSICAL EXAMINATION: ECOG PERFORMANCE STATUS: 2  . There were no vitals filed for this visit. There were no vitals filed for  this visit. .There is no height or weight on file to calculate BMI.  Phone Visit  LABORATORY DATA:  I have reviewed the data as listed  Component     Latest Ref Rng & Units 12/08/2018  WBC  4.0 - 10.5 K/uL 10.5  RBC     3.87 - 5.11 MIL/uL 3.11 (L)  Hemoglobin     12.0 - 15.0 g/dL 8.8 (L)  HCT     36.0 - 46.0 % 28.2 (L)  MCV     80.0 - 100.0 fL 90.7  MCH     26.0 - 34.0 pg 28.3  MCHC     30.0 - 36.0 g/dL 31.2  RDW     11.5 - 15.5 % 14.2  Platelets     150 - 400 K/uL 244  nRBC     0.0 - 0.2 % 0.0  Neutrophils     % 57  NEUT#     1.7 - 7.7 K/uL 6.1  Lymphocytes     % 31  Lymphocyte #     0.7 - 4.0 K/uL 3.3  Monocytes Relative     % 7  Monocyte #     0.1 - 1.0 K/uL 0.7  Eosinophil     % 4  Eosinophils Absolute     0.0 - 0.5 K/uL 0.4  Basophil     % 1  Basophils Absolute     0.0 - 0.1 K/uL 0.1  Immature Granulocytes     % 0  Abs Immature Granulocytes     0.00 - 0.07 K/uL 0.04  Sodium     135 - 145 mmol/L 143  Potassium     3.5 - 5.1 mmol/L 3.5  Chloride     98 - 111 mmol/L 110  CO2     22 - 32 mmol/L 17 (L)  Glucose     70 - 99 mg/dL 153 (H)  BUN     8 - 23 mg/dL 40 (H)  Creatinine     0.44 - 1.00 mg/dL 2.52 (H)  Calcium     8.9 - 10.3 mg/dL 10.1  GFR, Est Non African American     >60 mL/min 18 (L)  GFR, Est African American     >60 mL/min 21 (L)  Anion gap     5 - 15 16 (H)  Ferritin     11 - 307 ng/mL 82  Vitamin B12     180 - 914 pg/mL >7,500 (H)  Vitamin D, 25-Hydroxy     30.0 - 100.0 ng/mL 87.0  Vit D, 1,25-Dihydroxy     19.9 - 79.3 pg/mL 47.7  TSH     0.308 - 3.960 uIU/mL 1.664   . CBC Latest Ref Rng & Units 12/08/2018 11/16/2018 01/13/2011  WBC 4.0 - 10.5 K/uL 10.5 11.5(H) 10.5  Hemoglobin 12.0 - 15.0 g/dL 8.8(L) 9.7(L) 9.0(L)  Hematocrit 36.0 - 46.0 % 28.2(L) 31.7(L) 27.5(L)  Platelets 150 - 400 K/uL 244 258 188    . CMP Latest Ref Rng & Units 12/08/2018 11/16/2018 11/16/2018  Glucose 70 - 99 mg/dL 153(H) 93 -  BUN 8 -  23 mg/dL 40(H) 27(H) -  Creatinine 0.44 - 1.00 mg/dL 2.52(H) 2.34(H) -  Sodium 135 - 145 mmol/L 143 142 -  Potassium 3.5 - 5.1 mmol/L 3.5 4.0 -  Chloride 98 - 111 mmol/L 110 112(H) -  CO2 22 - 32 mmol/L 17(L) 21(L) -  Calcium 8.9 - 10.3 mg/dL 10.1 10.5(H) 10.7(H)  Total Protein 6.5 - 8.1 g/dL - 7.6 -  Total Bilirubin 0.3 - 1.2 mg/dL - 0.4 -  Alkaline Phos 38 - 126 U/L - 71 -  AST 15 - 41 U/L - 14(L) -  ALT 0 - 44 U/L -  14 -   . No results found for: IRON, TIBC, IRONPCTSAT (Iron and TIBC)  Lab Results  Component Value Date   FERRITIN 82 12/08/2018    08/03/18 BMP:    07/27/18 Iron Panel, CMP, and CBC w/diff:      RADIOGRAPHIC STUDIES: I have personally reviewed the radiological images as listed and agreed with the findings in the report. Ct Abdomen Pelvis Wo Contrast  Result Date: 12/20/2018 CLINICAL DATA:  Hypercalcemia, proteinuria. EXAM: CT CHEST, ABDOMEN AND PELVIS WITHOUT CONTRAST TECHNIQUE: Multidetector CT imaging of the chest, abdomen and pelvis was performed following the standard protocol without IV contrast. COMPARISON:  Radiographs from 11/22/2018 FINDINGS: CT CHEST FINDINGS Cardiovascular: Atherosclerotic calcification of the aortic arch. Aortic valve calcifications. Moderate pericardial effusion. Mediastinum/Nodes: Contrast medium in the esophagus suggesting dysmotility or reflux. No pathologic adenopathy. Lungs/Pleura: Subsegmental atelectasis or scarring in the left lower lobe and inferiorly in the left upper lobe. Small calcified left lower lobe pulmonary nodule on image 120/4, likely postinflammatory. Musculoskeletal: Lower cervical and also thoracic spondylosis and degenerative disc disease. CT ABDOMEN PELVIS FINDINGS Hepatobiliary: Gallstones are present in the gallbladder. No biliary dilatation. Otherwise unremarkable. Pancreas: Unremarkable Spleen: 0.7 cm hypodensity centrally in the spleen on image 57/2 is technically too small to characterize although  statistically likely to be benign. Adrenals/Urinary Tract: Both adrenal glands appear normal. A 5.6 cm exophytic lesion of the right kidney lower pole has an inner density of 15 Hounsfield units. A smaller hypodense lesion of the marked right mid kidney posteriorly has an internal density of 3 Hounsfield units. Punctate 4 by 3 mm calcification in the left kidney upper pole is probably along the inferior margin of a hypodense lesions such as a cyst, image 63/2. There is also a small hyperdense peripheral lesion of the left kidney upper pole measuring 0.4 cm in diameter on image 41/5. In the vicinity of the left UVJ there is a 2 mm punctate calcification favoring a nonobstructive renal calculus. No hydronephrosis or hydroureter. Stomach/Bowel: Periampullary duodenal diverticulum. There is also a diverticulum of the distal duodenum. Otherwise unremarkable. Vascular/Lymphatic: Aortoiliac atherosclerotic vascular disease. Reproductive: Uterus absent.  Adnexa unremarkable. Other: No supplemental non-categorized findings. Musculoskeletal: Retrograde right femoral intramedullary nail. A 1.4 by 1.2 cm lesion in the L4 vertebral body eccentric to the right has trabecular coarsening typical for hemangioma. Questionable similar lesion in the L2 vertebra. There is lumbar spondylosis and degenerative disc disease resulting in right foraminal impingement at L4-5 and L5-S1, and left foraminal impingement at L5-S1. IMPRESSION: 1. No specific worrisome bony lesions to explain the patient's hypercalcemia. There is a suspected hemangioma at L4 and possibly at L2. 2. 2 mm punctate calcification in the vicinity of the left UVJ suspicious for a nonobstructive calculus. No hydronephrosis or hydroureter. 3. Punctate calcification in the left kidney upper pole is likely along the inferior margin of a cyst. There is also a small peripheral hyperdense lesion in the left kidney upper pole measuring 4 mm in diameter, probably a complex cyst but  technically too small to characterize. 4. 2 hypodense lesions in the right kidney are probably cysts based on low-density. 5. Contrast medium in the esophagus suggests motility or reflux. 6. Moderate pericardial effusion. 7. Aortic Atherosclerosis (ICD10-I70.0). Cholelithiasis. Lower lumbar impingement due to spondylosis and degenerative disc disease. Electronically Signed   By: Van Clines M.D.   On: 12/20/2018 10:59   Ct Chest Wo Contrast  Result Date: 12/20/2018 CLINICAL DATA:  Hypercalcemia, proteinuria. EXAM: CT CHEST, ABDOMEN AND PELVIS  WITHOUT CONTRAST TECHNIQUE: Multidetector CT imaging of the chest, abdomen and pelvis was performed following the standard protocol without IV contrast. COMPARISON:  Radiographs from 11/22/2018 FINDINGS: CT CHEST FINDINGS Cardiovascular: Atherosclerotic calcification of the aortic arch. Aortic valve calcifications. Moderate pericardial effusion. Mediastinum/Nodes: Contrast medium in the esophagus suggesting dysmotility or reflux. No pathologic adenopathy. Lungs/Pleura: Subsegmental atelectasis or scarring in the left lower lobe and inferiorly in the left upper lobe. Small calcified left lower lobe pulmonary nodule on image 120/4, likely postinflammatory. Musculoskeletal: Lower cervical and also thoracic spondylosis and degenerative disc disease. CT ABDOMEN PELVIS FINDINGS Hepatobiliary: Gallstones are present in the gallbladder. No biliary dilatation. Otherwise unremarkable. Pancreas: Unremarkable Spleen: 0.7 cm hypodensity centrally in the spleen on image 57/2 is technically too small to characterize although statistically likely to be benign. Adrenals/Urinary Tract: Both adrenal glands appear normal. A 5.6 cm exophytic lesion of the right kidney lower pole has an inner density of 15 Hounsfield units. A smaller hypodense lesion of the marked right mid kidney posteriorly has an internal density of 3 Hounsfield units. Punctate 4 by 3 mm calcification in the left  kidney upper pole is probably along the inferior margin of a hypodense lesions such as a cyst, image 63/2. There is also a small hyperdense peripheral lesion of the left kidney upper pole measuring 0.4 cm in diameter on image 41/5. In the vicinity of the left UVJ there is a 2 mm punctate calcification favoring a nonobstructive renal calculus. No hydronephrosis or hydroureter. Stomach/Bowel: Periampullary duodenal diverticulum. There is also a diverticulum of the distal duodenum. Otherwise unremarkable. Vascular/Lymphatic: Aortoiliac atherosclerotic vascular disease. Reproductive: Uterus absent.  Adnexa unremarkable. Other: No supplemental non-categorized findings. Musculoskeletal: Retrograde right femoral intramedullary nail. A 1.4 by 1.2 cm lesion in the L4 vertebral body eccentric to the right has trabecular coarsening typical for hemangioma. Questionable similar lesion in the L2 vertebra. There is lumbar spondylosis and degenerative disc disease resulting in right foraminal impingement at L4-5 and L5-S1, and left foraminal impingement at L5-S1. IMPRESSION: 1. No specific worrisome bony lesions to explain the patient's hypercalcemia. There is a suspected hemangioma at L4 and possibly at L2. 2. 2 mm punctate calcification in the vicinity of the left UVJ suspicious for a nonobstructive calculus. No hydronephrosis or hydroureter. 3. Punctate calcification in the left kidney upper pole is likely along the inferior margin of a cyst. There is also a small peripheral hyperdense lesion in the left kidney upper pole measuring 4 mm in diameter, probably a complex cyst but technically too small to characterize. 4. 2 hypodense lesions in the right kidney are probably cysts based on low-density. 5. Contrast medium in the esophagus suggests motility or reflux. 6. Moderate pericardial effusion. 7. Aortic Atherosclerosis (ICD10-I70.0). Cholelithiasis. Lower lumbar impingement due to spondylosis and degenerative disc disease.  Electronically Signed   By: Van Clines M.D.   On: 12/20/2018 10:59   Dg Bone Survey Met  Result Date: 11/23/2018 CLINICAL DATA:  Rule out multiple myeloma EXAM: METASTATIC BONE SURVEY COMPARISON:  None. FINDINGS: Negative for lytic lesions of myeloma. Negative for acute fracture Chronic fracture right femur with locking intramedullary rod. Moderate to advanced degenerative change in both knees. Thoracic scoliosis. Disc degeneration and spurring in the cervical and thoracic spine. Disc degeneration L4-5 and L5-S1. Degenerative change in the right wrist with calcific tendinosis distal to the ulna. IMPRESSION: Negative for bony changes of myeloma. Electronically Signed   By: Franchot Gallo M.D.   On: 11/23/2018 07:49    ASSESSMENT &  PLAN:  72 y.o. female with  1. Elevated Serum Free Light Chains Labs upon initial presentation from 07/27/18 HGB at 9.8. 08/03/18 Creatinine at 2.13, Calcium at 10.5. Recent kidney function change in 6 weeks prior to initial encounter reported by pt.  Pt also reports having elevated serum free light chains, however these labs were not made available at the time of our initial visit.   -Discussed the 11/23/18 Bone survey which revealed "Negative for bony changes of myeloma."  2. Hypercalcemia ? Etiology - likely high levels of vit D replacement . Calcium levels now WNL.  3. Anemia of CKD.  PLAN A: -Discussed most recent labwork from 12/08/2018; elevated calcium from 10/2018 has now normalized, elevated/high normal  Vit D levels. -Discussed the results of 12/20/2018 CT chest, abdomen, and pelvis, which revealed no obvious signs of cancer that would explain high calcium levels. No concerns for tumors in the lungs, bones, and abdomen. Discussed other incidental findings including moderate pericardial effusion. -Discussed that kappa light chain elevation is likely related to her CKD with decreased light chain excretion. -Discussed that anemia likely caused by CKD and  does not be related to paraproteinemia. -would recommend f/u with nephrology for continued management of CKD and anemia related to this. (will need Iron replacement to maintain ferritin >100) and consideration of ESA's if hgb<9. -Recommend that PCP gets an ECHO because moderate pericardial effusion was revealed on 12/20/2018 CT. -Continue to hold Vit D and check levels with PCP in a few months. Consider restarting at a lower dose (2000 units daily) in 2-3 months. -Will see the pt back prn   RTC as needed   No orders of the defined types were placed in this encounter.  All of the patients questions were answered with apparent satisfaction. The patient knows to call the clinic with any problems, questions or concerns.  The total time spent in the appt was 15 minutes and more than 50% was on counseling and direct patient cares.   Sullivan Lone MD MS AAHIVMS [facility] Hematology/Oncology Physician [facility]  (Office):       [phone] (Work cell):  [phone] (Fax):           [phone]  12/22/2018 9:57 AM  I, De Burrs, am acting as a scribe for Dr. Irene Limbo  .I have reviewed the above documentation for accuracy and completeness, and I agree with the above. Brunetta Genera MD

## 2018-12-22 ENCOUNTER — Inpatient Hospital Stay (HOSPITAL_BASED_OUTPATIENT_CLINIC_OR_DEPARTMENT_OTHER): Payer: Medicare HMO | Admitting: Hematology

## 2018-12-22 ENCOUNTER — Telehealth: Payer: Self-pay | Admitting: Hematology

## 2018-12-22 DIAGNOSIS — D472 Monoclonal gammopathy: Secondary | ICD-10-CM

## 2018-12-22 DIAGNOSIS — D649 Anemia, unspecified: Secondary | ICD-10-CM

## 2018-12-22 NOTE — Telephone Encounter (Signed)
Per 7/24 los RTC as needed.

## 2018-12-27 DIAGNOSIS — I129 Hypertensive chronic kidney disease with stage 1 through stage 4 chronic kidney disease, or unspecified chronic kidney disease: Secondary | ICD-10-CM | POA: Diagnosis not present

## 2018-12-27 DIAGNOSIS — D631 Anemia in chronic kidney disease: Secondary | ICD-10-CM | POA: Diagnosis not present

## 2018-12-27 DIAGNOSIS — E1122 Type 2 diabetes mellitus with diabetic chronic kidney disease: Secondary | ICD-10-CM | POA: Diagnosis not present

## 2018-12-27 DIAGNOSIS — R609 Edema, unspecified: Secondary | ICD-10-CM | POA: Diagnosis not present

## 2018-12-27 DIAGNOSIS — N184 Chronic kidney disease, stage 4 (severe): Secondary | ICD-10-CM | POA: Diagnosis not present

## 2018-12-27 DIAGNOSIS — N281 Cyst of kidney, acquired: Secondary | ICD-10-CM | POA: Diagnosis not present

## 2018-12-27 DIAGNOSIS — R809 Proteinuria, unspecified: Secondary | ICD-10-CM | POA: Diagnosis not present

## 2018-12-28 ENCOUNTER — Other Ambulatory Visit (HOSPITAL_COMMUNITY): Payer: Self-pay | Admitting: Internal Medicine

## 2018-12-28 DIAGNOSIS — N184 Chronic kidney disease, stage 4 (severe): Secondary | ICD-10-CM

## 2018-12-28 DIAGNOSIS — R809 Proteinuria, unspecified: Secondary | ICD-10-CM

## 2018-12-28 DIAGNOSIS — I129 Hypertensive chronic kidney disease with stage 1 through stage 4 chronic kidney disease, or unspecified chronic kidney disease: Secondary | ICD-10-CM

## 2019-01-03 NOTE — Discharge Instructions (Signed)

## 2019-01-05 ENCOUNTER — Encounter (HOSPITAL_COMMUNITY): Payer: Self-pay

## 2019-01-05 ENCOUNTER — Other Ambulatory Visit: Payer: Self-pay

## 2019-01-05 ENCOUNTER — Encounter (HOSPITAL_COMMUNITY)
Admission: RE | Admit: 2019-01-05 | Discharge: 2019-01-05 | Disposition: A | Discharge status: Home / Self Care | Payer: Medicare HMO | Source: Ambulatory Visit | Attending: Internal Medicine | Admitting: Internal Medicine

## 2019-01-05 DIAGNOSIS — D631 Anemia in chronic kidney disease: Secondary | ICD-10-CM | POA: Diagnosis not present

## 2019-01-05 DIAGNOSIS — N184 Chronic kidney disease, stage 4 (severe): Secondary | ICD-10-CM | POA: Diagnosis present

## 2019-01-05 HISTORY — DX: Unspecified osteoarthritis, unspecified site: M19.90

## 2019-01-05 HISTORY — DX: Chronic kidney disease, unspecified: N18.9

## 2019-01-05 HISTORY — DX: Anemia, unspecified: D64.9

## 2019-01-05 LAB — FERRITIN: Ferritin: 65 ng/mL (ref 11–307)

## 2019-01-05 LAB — POCT HEMOGLOBIN-HEMACUE: Hemoglobin: 9.4 g/dL — ABNORMAL LOW (ref 12.0–15.0)

## 2019-01-05 LAB — IRON AND TIBC
Iron: 59 ug/dL (ref 28–170)
Saturation Ratios: 19 % (ref 10.4–31.8)
TIBC: 306 ug/dL (ref 250–450)
UIBC: 247 ug/dL

## 2019-01-05 MED ORDER — EPOETIN ALFA-EPBX 10000 UNIT/ML IJ SOLN
INTRAMUSCULAR | Status: AC
  Filled 2019-01-05: qty 2

## 2019-01-05 MED ORDER — EPOETIN ALFA-EPBX 10000 UNIT/ML IJ SOLN
20000.0000 [IU] | Freq: Once | INTRAMUSCULAR | Status: AC
  Administered 2019-01-05: 20000 [IU] via SUBCUTANEOUS

## 2019-01-08 ENCOUNTER — Other Ambulatory Visit: Payer: Self-pay | Admitting: Radiology

## 2019-01-08 DIAGNOSIS — E538 Deficiency of other specified B group vitamins: Secondary | ICD-10-CM | POA: Diagnosis not present

## 2019-01-09 ENCOUNTER — Other Ambulatory Visit: Payer: Self-pay

## 2019-01-09 ENCOUNTER — Ambulatory Visit (HOSPITAL_COMMUNITY)
Admission: RE | Admit: 2019-01-09 | Discharge: 2019-01-09 | Disposition: A | Discharge status: Home / Self Care | Payer: Medicare HMO | Source: Ambulatory Visit | Attending: Internal Medicine | Admitting: Internal Medicine

## 2019-01-09 ENCOUNTER — Encounter (HOSPITAL_COMMUNITY): Payer: Self-pay

## 2019-01-09 DIAGNOSIS — F419 Anxiety disorder, unspecified: Secondary | ICD-10-CM | POA: Diagnosis not present

## 2019-01-09 DIAGNOSIS — E1122 Type 2 diabetes mellitus with diabetic chronic kidney disease: Secondary | ICD-10-CM | POA: Insufficient documentation

## 2019-01-09 DIAGNOSIS — I129 Hypertensive chronic kidney disease with stage 1 through stage 4 chronic kidney disease, or unspecified chronic kidney disease: Secondary | ICD-10-CM | POA: Diagnosis not present

## 2019-01-09 DIAGNOSIS — N184 Chronic kidney disease, stage 4 (severe): Secondary | ICD-10-CM | POA: Insufficient documentation

## 2019-01-09 DIAGNOSIS — R809 Proteinuria, unspecified: Secondary | ICD-10-CM

## 2019-01-09 DIAGNOSIS — R69 Illness, unspecified: Secondary | ICD-10-CM | POA: Diagnosis not present

## 2019-01-09 DIAGNOSIS — N189 Chronic kidney disease, unspecified: Secondary | ICD-10-CM | POA: Diagnosis not present

## 2019-01-09 DIAGNOSIS — Z79899 Other long term (current) drug therapy: Secondary | ICD-10-CM | POA: Diagnosis not present

## 2019-01-09 DIAGNOSIS — Z7984 Long term (current) use of oral hypoglycemic drugs: Secondary | ICD-10-CM | POA: Insufficient documentation

## 2019-01-09 LAB — CBC
HCT: 30.9 % — ABNORMAL LOW (ref 36.0–46.0)
Hemoglobin: 9.5 g/dL — ABNORMAL LOW (ref 12.0–15.0)
MCH: 29.1 pg (ref 26.0–34.0)
MCHC: 30.7 g/dL (ref 30.0–36.0)
MCV: 94.8 fL (ref 80.0–100.0)
Platelets: 275 10*3/uL (ref 150–400)
RBC: 3.26 MIL/uL — ABNORMAL LOW (ref 3.87–5.11)
RDW: 13.7 % (ref 11.5–15.5)
WBC: 12.2 10*3/uL — ABNORMAL HIGH (ref 4.0–10.5)
nRBC: 0.3 % — ABNORMAL HIGH (ref 0.0–0.2)

## 2019-01-09 LAB — PROTIME-INR
INR: 1 (ref 0.8–1.2)
Prothrombin Time: 12.8 seconds (ref 11.4–15.2)

## 2019-01-09 LAB — GLUCOSE, CAPILLARY: Glucose-Capillary: 128 mg/dL — ABNORMAL HIGH (ref 70–99)

## 2019-01-09 MED ORDER — MIDAZOLAM HCL 2 MG/2ML IJ SOLN
INTRAMUSCULAR | Status: AC | PRN
  Administered 2019-01-09: 0.5 mg via INTRAVENOUS
  Administered 2019-01-09: 1 mg via INTRAVENOUS

## 2019-01-09 MED ORDER — SODIUM CHLORIDE 0.9 % IV SOLN: INTRAVENOUS | Status: DC

## 2019-01-09 MED ORDER — FENTANYL CITRATE (PF) 100 MCG/2ML IJ SOLN
INTRAMUSCULAR | Status: AC
  Filled 2019-01-09: qty 2

## 2019-01-09 MED ORDER — LIDOCAINE HCL (PF) 1 % IJ SOLN
INTRAMUSCULAR | Status: AC
  Filled 2019-01-09: qty 30

## 2019-01-09 MED ORDER — GELATIN ABSORBABLE 12-7 MM EX MISC
CUTANEOUS | Status: AC
  Filled 2019-01-09: qty 1

## 2019-01-09 MED ORDER — MIDAZOLAM HCL 2 MG/2ML IJ SOLN
INTRAMUSCULAR | Status: AC
  Filled 2019-01-09: qty 2

## 2019-01-09 MED ORDER — FENTANYL CITRATE (PF) 100 MCG/2ML IJ SOLN
INTRAMUSCULAR | Status: AC | PRN
  Administered 2019-01-09: 50 ug via INTRAVENOUS
  Administered 2019-01-09: 25 ug via INTRAVENOUS

## 2019-01-09 MED ORDER — HYDROCODONE-ACETAMINOPHEN 5-325 MG PO TABS: 1.0000 | ORAL_TABLET | ORAL | Status: DC | PRN

## 2019-01-09 NOTE — Discharge Instructions (Signed)
Percutaneous Kidney Biopsy, Care After °This sheet gives you information about how to care for yourself after your procedure. Your health care provider may also give you more specific instructions. If you have problems or questions, contact your health care provider. °What can I expect after the procedure? °After the procedure, it is common to have: °· Pain or soreness near the area where the needle went through your skin (biopsy site). °· Bright pink or cloudy urine for 24 hours after the procedure. °Follow these instructions at home: °Activity °· Return to your normal activities as told by your health care provider. Ask your health care provider what activities are safe for you. °· Do not drive for 24 hours if you were given a medicine to help you relax (sedative). °· Do not lift anything that is heavier than 10 lb (4.5 kg) until your health care provider tells you that it is safe. °· Avoid activities that take a lot of effort (are strenuous) until your health care provider approves. Most people will have to wait 2 weeks before returning to activities such as exercise or sexual intercourse. °General instructions ° °· Take over-the-counter and prescription medicines only as told by your health care provider. °· You may eat and drink after your procedure. Follow instructions from your health care provider about eating or drinking restrictions. °· Check your biopsy site every day for signs of infection. Check for: °? More redness, swelling, or pain. °? More fluid or blood. °? Warmth. °? Pus or a bad smell. °· Keep all follow-up visits as told by your health care provider. This is important. °Contact a health care provider if: °· You have more redness, swelling, or pain around your biopsy site. °· You have more fluid or blood coming from your biopsy site. °· Your biopsy site feels warm to the touch. °· You have pus or a bad smell coming from your biopsy site. °· You have blood in your urine more than 24 hours after  your procedure. °Get help right away if: °· You have dark red or brown urine. °· You have a fever. °· You are unable to urinate. °· You feel burning when you urinate. °· You feel faint. °· You have severe pain in your abdomen or side. °This information is not intended to replace advice given to you by your health care provider. Make sure you discuss any questions you have with your health care provider. °Document Released: 01/17/2013 Document Revised: 04/29/2017 Document Reviewed: 02/27/2016 °Elsevier Patient Education © 2020 [employer] °Moderate Conscious Sedation, Adult, Care After °These instructions provide you with information about caring for yourself after your procedure. Your health care provider may also give you more specific instructions. Your treatment has been planned according to current medical practices, but problems sometimes occur. Call your health care provider if you have any problems or questions after your procedure. °What can I expect after the procedure? °After your procedure, it is common: °· To feel sleepy for several hours. °· To feel clumsy and have poor balance for several hours. °· To have poor judgment for several hours. °· To vomit if you eat too soon. °Follow these instructions at home: °For at least 24 hours after the procedure: ° °· Do not: °? Participate in activities where you could fall or become injured. °? Drive. °? Use heavy machinery. °? Drink alcohol. °? Take sleeping pills or medicines that cause drowsiness. °? Make important decisions or sign legal documents. °? Take care of children on your   own. °· Rest. °Eating and drinking °· Follow the diet recommended by your health care provider. °· If you vomit: °? Drink water, juice, or soup when you can drink without vomiting. °? Make sure you have little or no nausea before eating solid foods. °General instructions °· Have a responsible adult stay with you until you are awake and alert. °· Take over-the-counter and  prescription medicines only as told by your health care provider. °· If you smoke, do not smoke without supervision. °· Keep all follow-up visits as told by your health care provider. This is important. °Contact a health care provider if: °· You keep feeling nauseous or you keep vomiting. °· You feel light-headed. °· You develop a rash. °· You have a fever. °Get help right away if: °· You have trouble breathing. °This information is not intended to replace advice given to you by your health care provider. Make sure you discuss any questions you have with your health care provider. °Document Released: 03/07/2013 Document Revised: 04/29/2017 Document Reviewed: 09/06/2015 °Elsevier Patient Education © 2020 [employer] ° °

## 2019-01-09 NOTE — Progress Notes (Signed)
Voided 350 ccs of clear urine

## 2019-01-09 NOTE — Procedures (Signed)
Interventional Radiology Procedure Note  Procedure: US guided random renal biopsy, LEFT kidney  Complications: Small immediate perinephric hematoma, non expanding.   Estimated Blood Loss: < 25 mL  Recommendations: - Bedrest x 4 hrs - DC home if no signs of further bleeding.   Signed,  Criselda Peaches, MD

## 2019-01-09 NOTE — H&P (Signed)
Chief Complaint: Patient was seen in consultation today for random renal biopsy at the request of Malone,Beth A  Referring Physician(s): Justin Mend  Supervising Physician: Jacqulynn Cadet  Patient Status: [facility] - Out-pt  History of Present Illness: Beth Malone is a 73 y.o. female   CKD 4 Proteinuria DM/ HTN  GFR worsening  Scheduled now for random renal biopsy  Past Medical History:  Diagnosis Date  . Anemia   . Anxiety   . Arthritis   . Chronic kidney disease   . Diabetes mellitus   . Dysphagia    last year or so   . Femur fracture ([place])   . Hypertension     Past Surgical History:  Procedure Laterality Date  . ABDOMINAL HYSTERECTOMY     complete  . BALLOON DILATION N/A 03/22/2016   Procedure: BALLOON DILATION;  Surgeon: [name], MD;  Location: [facility];  Service: Endoscopy;  Laterality: N/A;  . colonscopy  2014  . ESOPHAGEAL MANOMETRY N/A 04/05/2016   Procedure: ESOPHAGEAL MANOMETRY (EM);  Surgeon: [name], MD;  Location: [facility];  Service: Endoscopy;  Laterality: N/A;  . ESOPHAGOGASTRODUODENOSCOPY (EGD) WITH PROPOFOL N/A 03/22/2016   Procedure: ESOPHAGOGASTRODUODENOSCOPY (EGD) WITH PROPOFOL;  Surgeon: [name], MD;  Location: [facility];  Service: Endoscopy;  Laterality: N/A;  . ESOPHAGOGASTRODUODENOSCOPY (EGD) WITH PROPOFOL N/A 06/01/2016   Procedure: ESOPHAGOGASTRODUODENOSCOPY (EGD) WITH PROPOFOL;  Surgeon: [name], MD;  Location: [facility];  Service: Endoscopy;  Laterality: N/A;  . FEMUR SURGERY Right 2011   rod placed  . TUBAL LIGATION      Allergies: Augmentin [amoxicillin-pot clavulanate] and Codeine  Medications: Prior to Admission medications   Medication Sig Start Date End Date Taking? Authorizing Provider  ALPRAZolam Duanne Moron) 0.25 MG tablet Take 0.25 mg by mouth daily as needed for anxiety. anxiety    Yes [provider]  amLODipine (NORVASC) 10 MG tablet Take 10 mg by  mouth daily. 04/10/16  Yes [provider]  atorvastatin (LIPITOR) 20 MG tablet Take 20 mg by mouth at bedtime.    Yes [provider]  cetirizine (ZYRTEC) 10 MG tablet Take 10 mg by mouth daily as needed for allergies.    Yes [provider]  fluticasone (FLONASE) 50 MCG/ACT nasal spray Place 1 spray into both nostrils daily as needed for allergies or rhinitis.   Yes [provider]  glipiZIDE (GLUCOTROL) 5 MG tablet Take 2.5 mg by mouth daily before breakfast.    Yes [provider]  Metoprolol Succinate 50 MG CS24 Take 50 mg by mouth daily.    Yes [provider]  Turmeric 500 MG TABS Take 1 tablet by mouth 2 (two) times daily.    Yes [provider]     Family History  Family history unknown: Yes    Social History   Socioeconomic History  . Marital status: Single    Spouse name: Not on file  . Number of children: Not on file  . Years of education: Not on file  . Highest education level: Not on file  Occupational History  . Not on file  Social Needs  . Financial resource strain: Not on file  . Food insecurity    Worry: Not on file    Inability: Not on file  . Transportation needs    Medical: Not on file    Non-medical: Not on file  Tobacco Use  . Smoking status: Never Smoker  . Smokeless tobacco: Never Used  Substance and Sexual Activity  . Alcohol use: Yes    Comment: rarely drink wine  . Drug use: No  . Sexual activity: Not on file  Lifestyle  . Physical activity    Days per week: Not on file    Minutes per session: Not on file  . Stress: Not on file  Relationships  . Social Herbalist on phone: Not on file    Gets together: Not on file    Attends religious service: Not on file    Active member of club or organization: Not on file    Attends meetings of clubs or organizations: Not on file    Relationship status: Not on file  Other Topics Concern  . Not on file  Social History Narrative   . Not on file    Review of Systems: A 12 point ROS discussed and pertinent positives are indicated in the HPI above.  All other systems are negative.  Review of Systems  Constitutional: Negative for activity change, fatigue and fever.  Respiratory: Negative for cough and shortness of breath.   Cardiovascular: Negative for chest pain.  Gastrointestinal: Negative for abdominal pain.  Musculoskeletal: Negative for back pain and gait problem.  Neurological: Negative for weakness.  Psychiatric/Behavioral: Negative for behavioral problems and confusion.    Vital Signs: Pulse 68   Temp 98 F (36.7 C) (Oral)   Resp 16   Ht 5\' 4"  (1.626 m)   Wt 185 lb (83.9 kg)   SpO2 95%   BMI 31.76 kg/m   Physical Exam Vitals signs reviewed.  Cardiovascular:     Rate and Rhythm: Normal rate and regular rhythm.     Heart sounds: Murmur present.  Pulmonary:     Effort: Pulmonary effort is normal.     Breath sounds: Normal breath sounds.  Abdominal:     Palpations: Abdomen is soft.  Musculoskeletal: Normal range of motion.  Skin:    General: Skin is warm and dry.  Neurological:     Mental Status: She is alert and oriented to person, place, and time.  Psychiatric:        Mood and Affect: Mood normal.        Behavior: Behavior normal.        Thought Content: Thought content normal.        Judgment: Judgment normal.     Imaging: Ct Abdomen Pelvis Wo Contrast  Result Date: 12/20/2018 CLINICAL DATA:  Hypercalcemia, proteinuria. EXAM: CT CHEST, ABDOMEN AND PELVIS WITHOUT CONTRAST TECHNIQUE: Multidetector CT imaging of the chest, abdomen and pelvis was performed following the standard protocol without IV contrast. COMPARISON:  Radiographs from 11/22/2018 FINDINGS: CT CHEST FINDINGS Cardiovascular: Atherosclerotic calcification of the aortic arch. Aortic valve calcifications. Moderate pericardial effusion. Mediastinum/Nodes: Contrast medium in the esophagus suggesting dysmotility or reflux. No  pathologic adenopathy. Lungs/Pleura: Subsegmental atelectasis or scarring in the left lower lobe and inferiorly in the left upper lobe. Small calcified left lower lobe pulmonary nodule on image 120/4, likely postinflammatory. Musculoskeletal: Lower cervical and also thoracic spondylosis and degenerative disc disease. CT ABDOMEN PELVIS FINDINGS Hepatobiliary: Gallstones are present in the gallbladder. No biliary dilatation. Otherwise unremarkable. Pancreas: Unremarkable Spleen: 0.7 cm hypodensity centrally in the spleen on image 57/2 is technically too small to characterize although statistically likely to be benign. Adrenals/Urinary Tract: Both adrenal glands appear normal. A 5.6 cm exophytic lesion of the right kidney lower pole has an inner density of 15 Hounsfield units. A smaller hypodense lesion of  the marked right mid kidney posteriorly has an internal density of 3 Hounsfield units. Punctate 4 by 3 mm calcification in the left kidney upper pole is probably along the inferior margin of a hypodense lesions such as a cyst, image 63/2. There is also a small hyperdense peripheral lesion of the left kidney upper pole measuring 0.4 cm in diameter on image 41/5. In the vicinity of the left UVJ there is a 2 mm punctate calcification favoring a nonobstructive renal calculus. No hydronephrosis or hydroureter. Stomach/Bowel: Periampullary duodenal diverticulum. There is also a diverticulum of the distal duodenum. Otherwise unremarkable. Vascular/Lymphatic: Aortoiliac atherosclerotic vascular disease. Reproductive: Uterus absent.  Adnexa unremarkable. Other: No supplemental non-categorized findings. Musculoskeletal: Retrograde right femoral intramedullary nail. A 1.4 by 1.2 cm lesion in the L4 vertebral body eccentric to the right has trabecular coarsening typical for hemangioma. Questionable similar lesion in the L2 vertebra. There is lumbar spondylosis and degenerative disc disease resulting in right foraminal  impingement at L4-5 and L5-S1, and left foraminal impingement at L5-S1. IMPRESSION: 1. No specific worrisome bony lesions to explain the patient's hypercalcemia. There is a suspected hemangioma at L4 and possibly at L2. 2. 2 mm punctate calcification in the vicinity of the left UVJ suspicious for a nonobstructive calculus. No hydronephrosis or hydroureter. 3. Punctate calcification in the left kidney upper pole is likely along the inferior margin of a cyst. There is also a small peripheral hyperdense lesion in the left kidney upper pole measuring 4 mm in diameter, probably a complex cyst but technically too small to characterize. 4. 2 hypodense lesions in the right kidney are probably cysts based on low-density. 5. Contrast medium in the esophagus suggests motility or reflux. 6. Moderate pericardial effusion. 7. Aortic Atherosclerosis (ICD10-I70.0). Cholelithiasis. Lower lumbar impingement due to spondylosis and degenerative disc disease. Electronically Signed   By: Van Clines M.D.   On: 12/20/2018 10:59   Ct Chest Wo Contrast  Result Date: 12/20/2018 CLINICAL DATA:  Hypercalcemia, proteinuria. EXAM: CT CHEST, ABDOMEN AND PELVIS WITHOUT CONTRAST TECHNIQUE: Multidetector CT imaging of the chest, abdomen and pelvis was performed following the standard protocol without IV contrast. COMPARISON:  Radiographs from 11/22/2018 FINDINGS: CT CHEST FINDINGS Cardiovascular: Atherosclerotic calcification of the aortic arch. Aortic valve calcifications. Moderate pericardial effusion. Mediastinum/Nodes: Contrast medium in the esophagus suggesting dysmotility or reflux. No pathologic adenopathy. Lungs/Pleura: Subsegmental atelectasis or scarring in the left lower lobe and inferiorly in the left upper lobe. Small calcified left lower lobe pulmonary nodule on image 120/4, likely postinflammatory. Musculoskeletal: Lower cervical and also thoracic spondylosis and degenerative disc disease. CT ABDOMEN PELVIS FINDINGS  Hepatobiliary: Gallstones are present in the gallbladder. No biliary dilatation. Otherwise unremarkable. Pancreas: Unremarkable Spleen: 0.7 cm hypodensity centrally in the spleen on image 57/2 is technically too small to characterize although statistically likely to be benign. Adrenals/Urinary Tract: Both adrenal glands appear normal. A 5.6 cm exophytic lesion of the right kidney lower pole has an inner density of 15 Hounsfield units. A smaller hypodense lesion of the marked right mid kidney posteriorly has an internal density of 3 Hounsfield units. Punctate 4 by 3 mm calcification in the left kidney upper pole is probably along the inferior margin of a hypodense lesions such as a cyst, image 63/2. There is also a small hyperdense peripheral lesion of the left kidney upper pole measuring 0.4 cm in diameter on image 41/5. In the vicinity of the left UVJ there is a 2 mm punctate calcification favoring a nonobstructive renal calculus. No hydronephrosis or  hydroureter. Stomach/Bowel: Periampullary duodenal diverticulum. There is also a diverticulum of the distal duodenum. Otherwise unremarkable. Vascular/Lymphatic: Aortoiliac atherosclerotic vascular disease. Reproductive: Uterus absent.  Adnexa unremarkable. Other: No supplemental non-categorized findings. Musculoskeletal: Retrograde right femoral intramedullary nail. A 1.4 by 1.2 cm lesion in the L4 vertebral body eccentric to the right has trabecular coarsening typical for hemangioma. Questionable similar lesion in the L2 vertebra. There is lumbar spondylosis and degenerative disc disease resulting in right foraminal impingement at L4-5 and L5-S1, and left foraminal impingement at L5-S1. IMPRESSION: 1. No specific worrisome bony lesions to explain the patient's hypercalcemia. There is a suspected hemangioma at L4 and possibly at L2. 2. 2 mm punctate calcification in the vicinity of the left UVJ suspicious for a nonobstructive calculus. No hydronephrosis or  hydroureter. 3. Punctate calcification in the left kidney upper pole is likely along the inferior margin of a cyst. There is also a small peripheral hyperdense lesion in the left kidney upper pole measuring 4 mm in diameter, probably a complex cyst but technically too small to characterize. 4. 2 hypodense lesions in the right kidney are probably cysts based on low-density. 5. Contrast medium in the esophagus suggests motility or reflux. 6. Moderate pericardial effusion. 7. Aortic Atherosclerosis (ICD10-I70.0). Cholelithiasis. Lower lumbar impingement due to spondylosis and degenerative disc disease. Electronically Signed   By: Van Clines M.D.   On: 12/20/2018 10:59    Labs:  CBC: Recent Labs    11/16/18 1111 12/08/18 1128 01/05/19 1201 01/09/19 0613  WBC 11.5* 10.5  --  12.2*  HGB 9.7* 8.8* 9.4* 9.5*  HCT 31.7* 28.2*  --  30.9*  PLT 258 244  --  275    COAGS: No results for input(s): INR, APTT in the last 8760 hours.  BMP: Recent Labs    11/16/18 1111 12/08/18 1128  NA 142 143  K 4.0 3.5  CL 112* 110  CO2 21* 17*  GLUCOSE 93 153*  BUN 27* 40*  CALCIUM 10.5*  10.7* 10.1  CREATININE 2.34* 2.52*  GFRNONAA 20* 18*  GFRAA 23* 21*    LIVER FUNCTION TESTS: Recent Labs    11/16/18 1111  BILITOT 0.4  AST 14*  ALT 14  ALKPHOS 71  PROT 7.6  ALBUMIN 4.1    TUMOR MARKERS: No results for input(s): AFPTM, CEA, CA199, CHROMGRNA in the last 8760 hours.  Assessment and Plan:  CKD4; worsening GFR Proteinuria Scheduled for random renal bx Risks and benefits of random renal bx was discussed with the patient and/or patient's family including, but not limited to bleeding, infection, damage to adjacent structures or low yield requiring additional tests.  All of the questions were answered and there is agreement to proceed.  Consent signed and in chart.   Thank you for this interesting consult.  I greatly enjoyed meeting Shalaine Payson and look forward to  participating in their care.  A copy of this report was sent to the requesting provider on this date.  Electronically Signed: Lavonia Drafts, PA-C 01/09/2019, 7:10 AM   I spent a total of  30 Minutes   in face to face in clinical consultation, greater than 50% of which was counseling/coordinating care for random renal bx

## 2019-01-23 DIAGNOSIS — N184 Chronic kidney disease, stage 4 (severe): Secondary | ICD-10-CM | POA: Diagnosis not present

## 2019-01-23 DIAGNOSIS — I129 Hypertensive chronic kidney disease with stage 1 through stage 4 chronic kidney disease, or unspecified chronic kidney disease: Secondary | ICD-10-CM | POA: Diagnosis not present

## 2019-01-23 DIAGNOSIS — E7800 Pure hypercholesterolemia, unspecified: Secondary | ICD-10-CM | POA: Diagnosis not present

## 2019-01-23 DIAGNOSIS — E1121 Type 2 diabetes mellitus with diabetic nephropathy: Secondary | ICD-10-CM | POA: Diagnosis not present

## 2019-01-29 ENCOUNTER — Encounter (HOSPITAL_COMMUNITY): Payer: Self-pay | Admitting: Internal Medicine

## 2019-02-02 DIAGNOSIS — E1121 Type 2 diabetes mellitus with diabetic nephropathy: Secondary | ICD-10-CM | POA: Diagnosis not present

## 2019-02-02 DIAGNOSIS — Z1389 Encounter for screening for other disorder: Secondary | ICD-10-CM | POA: Diagnosis not present

## 2019-02-02 DIAGNOSIS — N184 Chronic kidney disease, stage 4 (severe): Secondary | ICD-10-CM | POA: Diagnosis not present

## 2019-02-02 DIAGNOSIS — E7800 Pure hypercholesterolemia, unspecified: Secondary | ICD-10-CM | POA: Diagnosis not present

## 2019-02-02 DIAGNOSIS — Z0000 Encounter for general adult medical examination without abnormal findings: Secondary | ICD-10-CM | POA: Diagnosis not present

## 2019-02-02 DIAGNOSIS — I129 Hypertensive chronic kidney disease with stage 1 through stage 4 chronic kidney disease, or unspecified chronic kidney disease: Secondary | ICD-10-CM | POA: Diagnosis not present

## 2019-02-02 DIAGNOSIS — Z79899 Other long term (current) drug therapy: Secondary | ICD-10-CM | POA: Diagnosis not present

## 2019-02-07 ENCOUNTER — Other Ambulatory Visit (HOSPITAL_COMMUNITY): Payer: Medicare HMO

## 2019-02-07 ENCOUNTER — Encounter (HOSPITAL_COMMUNITY)
Admission: RE | Admit: 2019-02-07 | Discharge: 2019-02-07 | Disposition: A | Discharge status: Home / Self Care | Payer: Medicare HMO | Source: Ambulatory Visit | Attending: Internal Medicine | Admitting: Internal Medicine

## 2019-02-07 ENCOUNTER — Ambulatory Visit (HOSPITAL_COMMUNITY): Payer: Medicare HMO

## 2019-02-07 ENCOUNTER — Encounter (HOSPITAL_COMMUNITY): Payer: Self-pay

## 2019-02-07 ENCOUNTER — Other Ambulatory Visit: Payer: Self-pay

## 2019-02-07 DIAGNOSIS — N184 Chronic kidney disease, stage 4 (severe): Secondary | ICD-10-CM | POA: Diagnosis not present

## 2019-02-07 DIAGNOSIS — N189 Chronic kidney disease, unspecified: Secondary | ICD-10-CM | POA: Diagnosis not present

## 2019-02-07 DIAGNOSIS — R609 Edema, unspecified: Secondary | ICD-10-CM | POA: Diagnosis not present

## 2019-02-07 DIAGNOSIS — N119 Chronic tubulo-interstitial nephritis, unspecified: Secondary | ICD-10-CM | POA: Diagnosis not present

## 2019-02-07 DIAGNOSIS — D631 Anemia in chronic kidney disease: Secondary | ICD-10-CM | POA: Diagnosis not present

## 2019-02-07 DIAGNOSIS — E1122 Type 2 diabetes mellitus with diabetic chronic kidney disease: Secondary | ICD-10-CM | POA: Diagnosis not present

## 2019-02-07 DIAGNOSIS — I129 Hypertensive chronic kidney disease with stage 1 through stage 4 chronic kidney disease, or unspecified chronic kidney disease: Secondary | ICD-10-CM | POA: Diagnosis not present

## 2019-02-07 DIAGNOSIS — N281 Cyst of kidney, acquired: Secondary | ICD-10-CM | POA: Diagnosis not present

## 2019-02-07 DIAGNOSIS — R809 Proteinuria, unspecified: Secondary | ICD-10-CM | POA: Diagnosis not present

## 2019-02-07 LAB — RENAL FUNCTION PANEL
Albumin: 3.5 g/dL (ref 3.5–5.0)
Anion gap: 12 (ref 5–15)
BUN: 59 mg/dL — ABNORMAL HIGH (ref 8–23)
CO2: 18 mmol/L — ABNORMAL LOW (ref 22–32)
Calcium: 8.1 mg/dL — ABNORMAL LOW (ref 8.9–10.3)
Chloride: 102 mmol/L (ref 98–111)
Creatinine, Ser: 3.07 mg/dL — ABNORMAL HIGH (ref 0.44–1.00)
GFR calc Af Amer: 17 mL/min — ABNORMAL LOW (ref >60)
GFR calc non Af Amer: 14 mL/min — ABNORMAL LOW (ref >60)
Glucose, Bld: 523 mg/dL (ref 70–99)
Phosphorus: 4.1 mg/dL (ref 2.5–4.6)
Potassium: 3.5 mmol/L (ref 3.5–5.1)
Sodium: 132 mmol/L — ABNORMAL LOW (ref 135–145)

## 2019-02-07 LAB — IRON AND TIBC
Iron: 62 ug/dL (ref 28–170)
Saturation Ratios: 21 % (ref 10.4–31.8)
TIBC: 299 ug/dL (ref 250–450)
UIBC: 237 ug/dL

## 2019-02-07 LAB — FERRITIN: Ferritin: 75 ng/mL (ref 11–307)

## 2019-02-07 LAB — POCT HEMOGLOBIN-HEMACUE: Hemoglobin: 8.8 g/dL — ABNORMAL LOW (ref 12.0–15.0)

## 2019-02-07 MED ORDER — EPOETIN ALFA-EPBX 10000 UNIT/ML IJ SOLN: 20000.0000 [IU] | Freq: Once | INTRAMUSCULAR | Status: DC

## 2019-02-07 NOTE — Progress Notes (Signed)
Spoke to Beth Malone at [facility] to report critical blood sugar of 523 and worsening renal function.  Patient was asymptomatic during visit.  Notified patient to expect a telephone call from Dr. Johnney Ou or her staff as to next steps.  Patient is on 20mg  of prednisone bid and glipizide 2.5 mg q am.

## 2019-02-08 DIAGNOSIS — L720 Epidermal cyst: Secondary | ICD-10-CM | POA: Diagnosis not present

## 2019-02-08 DIAGNOSIS — D2272 Melanocytic nevi of left lower limb, including hip: Secondary | ICD-10-CM | POA: Diagnosis not present

## 2019-02-08 DIAGNOSIS — D225 Melanocytic nevi of trunk: Secondary | ICD-10-CM | POA: Diagnosis not present

## 2019-02-08 DIAGNOSIS — D2271 Melanocytic nevi of right lower limb, including hip: Secondary | ICD-10-CM | POA: Diagnosis not present

## 2019-02-08 DIAGNOSIS — L853 Xerosis cutis: Secondary | ICD-10-CM | POA: Diagnosis not present

## 2019-02-08 DIAGNOSIS — L821 Other seborrheic keratosis: Secondary | ICD-10-CM | POA: Diagnosis not present

## 2019-02-08 DIAGNOSIS — D220 Melanocytic nevi of lip: Secondary | ICD-10-CM | POA: Diagnosis not present

## 2019-02-08 DIAGNOSIS — L814 Other melanin hyperpigmentation: Secondary | ICD-10-CM | POA: Diagnosis not present

## 2019-02-08 DIAGNOSIS — D692 Other nonthrombocytopenic purpura: Secondary | ICD-10-CM | POA: Diagnosis not present

## 2019-02-08 DIAGNOSIS — Z85828 Personal history of other malignant neoplasm of skin: Secondary | ICD-10-CM | POA: Diagnosis not present

## 2019-02-08 LAB — PTH, INTACT AND CALCIUM
Calcium, Total (PTH): 8.6 mg/dL — ABNORMAL LOW (ref 8.7–10.3)
PTH: 58 pg/mL (ref 15–65)

## 2019-02-09 DIAGNOSIS — E538 Deficiency of other specified B group vitamins: Secondary | ICD-10-CM | POA: Diagnosis not present

## 2019-02-28 DIAGNOSIS — R609 Edema, unspecified: Secondary | ICD-10-CM | POA: Diagnosis not present

## 2019-02-28 DIAGNOSIS — N119 Chronic tubulo-interstitial nephritis, unspecified: Secondary | ICD-10-CM | POA: Diagnosis not present

## 2019-02-28 DIAGNOSIS — N281 Cyst of kidney, acquired: Secondary | ICD-10-CM | POA: Diagnosis not present

## 2019-02-28 DIAGNOSIS — R809 Proteinuria, unspecified: Secondary | ICD-10-CM | POA: Diagnosis not present

## 2019-02-28 DIAGNOSIS — D631 Anemia in chronic kidney disease: Secondary | ICD-10-CM | POA: Diagnosis not present

## 2019-02-28 DIAGNOSIS — E1122 Type 2 diabetes mellitus with diabetic chronic kidney disease: Secondary | ICD-10-CM | POA: Diagnosis not present

## 2019-02-28 DIAGNOSIS — G4700 Insomnia, unspecified: Secondary | ICD-10-CM | POA: Diagnosis not present

## 2019-02-28 DIAGNOSIS — N184 Chronic kidney disease, stage 4 (severe): Secondary | ICD-10-CM | POA: Diagnosis not present

## 2019-02-28 DIAGNOSIS — I129 Hypertensive chronic kidney disease with stage 1 through stage 4 chronic kidney disease, or unspecified chronic kidney disease: Secondary | ICD-10-CM | POA: Diagnosis not present

## 2019-03-01 DIAGNOSIS — R69 Illness, unspecified: Secondary | ICD-10-CM | POA: Diagnosis not present

## 2019-03-05 ENCOUNTER — Encounter (HOSPITAL_COMMUNITY): Payer: Self-pay

## 2019-03-05 ENCOUNTER — Other Ambulatory Visit: Payer: Self-pay

## 2019-03-05 ENCOUNTER — Encounter (HOSPITAL_COMMUNITY)
Admission: RE | Admit: 2019-03-05 | Discharge: 2019-03-05 | Disposition: A | Discharge status: Home / Self Care | Payer: Medicare HMO | Source: Ambulatory Visit | Attending: Internal Medicine | Admitting: Internal Medicine

## 2019-03-05 DIAGNOSIS — D631 Anemia in chronic kidney disease: Secondary | ICD-10-CM | POA: Insufficient documentation

## 2019-03-05 DIAGNOSIS — N184 Chronic kidney disease, stage 4 (severe): Secondary | ICD-10-CM | POA: Diagnosis present

## 2019-03-05 DIAGNOSIS — R69 Illness, unspecified: Secondary | ICD-10-CM | POA: Diagnosis not present

## 2019-03-05 MED ORDER — SODIUM CHLORIDE 0.9 % IV SOLN
Freq: Once | INTRAVENOUS | Status: AC
  Administered 2019-03-05: 12:00:00 via INTRAVENOUS

## 2019-03-05 MED ORDER — SODIUM CHLORIDE 0.9 % IV SOLN
510.0000 mg | Freq: Once | INTRAVENOUS | Status: AC
  Administered 2019-03-05: 510 mg via INTRAVENOUS
  Filled 2019-03-05: qty 510

## 2019-03-06 DIAGNOSIS — R69 Illness, unspecified: Secondary | ICD-10-CM | POA: Diagnosis not present

## 2019-03-07 ENCOUNTER — Encounter (HOSPITAL_COMMUNITY): Payer: Self-pay

## 2019-03-07 ENCOUNTER — Encounter (HOSPITAL_COMMUNITY)
Admission: RE | Admit: 2019-03-07 | Discharge: 2019-03-07 | Disposition: A | Discharge status: Home / Self Care | Payer: Medicare HMO | Source: Ambulatory Visit | Attending: Internal Medicine | Admitting: Internal Medicine

## 2019-03-07 ENCOUNTER — Other Ambulatory Visit: Payer: Self-pay

## 2019-03-07 DIAGNOSIS — D631 Anemia in chronic kidney disease: Secondary | ICD-10-CM | POA: Diagnosis not present

## 2019-03-07 DIAGNOSIS — N184 Chronic kidney disease, stage 4 (severe): Secondary | ICD-10-CM | POA: Diagnosis not present

## 2019-03-07 LAB — FERRITIN: Ferritin: 276 ng/mL (ref 11–307)

## 2019-03-07 LAB — IRON AND TIBC
Iron: 359 ug/dL — ABNORMAL HIGH (ref 28–170)
Saturation Ratios: 120 % — ABNORMAL HIGH (ref 10.4–31.8)
TIBC: 299 ug/dL (ref 250–450)

## 2019-03-07 LAB — POCT HEMOGLOBIN-HEMACUE: Hemoglobin: 10.8 g/dL — ABNORMAL LOW (ref 12.0–15.0)

## 2019-03-07 MED ORDER — EPOETIN ALFA-EPBX 40000 UNIT/ML IJ SOLN
30000.0000 [IU] | Freq: Once | INTRAMUSCULAR | Status: AC
  Administered 2019-03-07: 14:00:00 30000 [IU] via SUBCUTANEOUS

## 2019-03-07 MED ORDER — EPOETIN ALFA-EPBX 40000 UNIT/ML IJ SOLN
INTRAMUSCULAR | Status: AC
  Filled 2019-03-07: qty 1

## 2019-03-12 DIAGNOSIS — E538 Deficiency of other specified B group vitamins: Secondary | ICD-10-CM | POA: Diagnosis not present

## 2019-03-14 DIAGNOSIS — Z23 Encounter for immunization: Secondary | ICD-10-CM | POA: Diagnosis not present

## 2019-03-21 DIAGNOSIS — N184 Chronic kidney disease, stage 4 (severe): Secondary | ICD-10-CM | POA: Diagnosis not present

## 2019-03-21 DIAGNOSIS — I129 Hypertensive chronic kidney disease with stage 1 through stage 4 chronic kidney disease, or unspecified chronic kidney disease: Secondary | ICD-10-CM | POA: Diagnosis not present

## 2019-03-21 DIAGNOSIS — E1121 Type 2 diabetes mellitus with diabetic nephropathy: Secondary | ICD-10-CM | POA: Diagnosis not present

## 2019-03-21 DIAGNOSIS — E7800 Pure hypercholesterolemia, unspecified: Secondary | ICD-10-CM | POA: Diagnosis not present

## 2019-03-22 DIAGNOSIS — M25562 Pain in left knee: Secondary | ICD-10-CM | POA: Diagnosis not present

## 2019-03-22 DIAGNOSIS — M1712 Unilateral primary osteoarthritis, left knee: Secondary | ICD-10-CM | POA: Diagnosis not present

## 2019-04-04 ENCOUNTER — Other Ambulatory Visit: Payer: Self-pay

## 2019-04-04 ENCOUNTER — Encounter (HOSPITAL_COMMUNITY)
Admission: RE | Admit: 2019-04-04 | Discharge: 2019-04-04 | Disposition: A | Discharge status: Home / Self Care | Payer: Medicare HMO | Source: Ambulatory Visit | Attending: Internal Medicine | Admitting: Internal Medicine

## 2019-04-04 ENCOUNTER — Encounter (HOSPITAL_COMMUNITY): Payer: Self-pay

## 2019-04-04 DIAGNOSIS — D631 Anemia in chronic kidney disease: Secondary | ICD-10-CM | POA: Insufficient documentation

## 2019-04-04 DIAGNOSIS — N184 Chronic kidney disease, stage 4 (severe): Secondary | ICD-10-CM | POA: Diagnosis not present

## 2019-04-04 LAB — FERRITIN: Ferritin: 340 ng/mL — ABNORMAL HIGH (ref 11–307)

## 2019-04-04 LAB — IRON AND TIBC
Iron: 42 ug/dL (ref 28–170)
Saturation Ratios: 18 % (ref 10.4–31.8)
TIBC: 232 ug/dL — ABNORMAL LOW (ref 250–450)
UIBC: 190 ug/dL

## 2019-04-04 LAB — POCT HEMOGLOBIN-HEMACUE: Hemoglobin: 11.4 g/dL — ABNORMAL LOW (ref 12.0–15.0)

## 2019-04-04 MED ORDER — EPOETIN ALFA-EPBX 40000 UNIT/ML IJ SOLN
INTRAMUSCULAR | Status: AC
  Filled 2019-04-04: qty 1

## 2019-04-04 MED ORDER — EPOETIN ALFA-EPBX 40000 UNIT/ML IJ SOLN
30000.0000 [IU] | Freq: Once | INTRAMUSCULAR | Status: AC
  Administered 2019-04-04: 13:00:00 30000 [IU] via SUBCUTANEOUS

## 2019-04-05 DIAGNOSIS — I129 Hypertensive chronic kidney disease with stage 1 through stage 4 chronic kidney disease, or unspecified chronic kidney disease: Secondary | ICD-10-CM | POA: Diagnosis not present

## 2019-04-05 DIAGNOSIS — M545 Low back pain: Secondary | ICD-10-CM | POA: Diagnosis not present

## 2019-04-05 DIAGNOSIS — E1121 Type 2 diabetes mellitus with diabetic nephropathy: Secondary | ICD-10-CM | POA: Diagnosis not present

## 2019-04-05 DIAGNOSIS — N184 Chronic kidney disease, stage 4 (severe): Secondary | ICD-10-CM | POA: Diagnosis not present

## 2019-04-06 DIAGNOSIS — R69 Illness, unspecified: Secondary | ICD-10-CM | POA: Diagnosis not present

## 2019-04-11 DIAGNOSIS — M1712 Unilateral primary osteoarthritis, left knee: Secondary | ICD-10-CM | POA: Diagnosis not present

## 2019-04-11 DIAGNOSIS — M25562 Pain in left knee: Secondary | ICD-10-CM | POA: Diagnosis not present

## 2019-04-13 DIAGNOSIS — Z1159 Encounter for screening for other viral diseases: Secondary | ICD-10-CM | POA: Diagnosis not present

## 2019-04-13 DIAGNOSIS — N184 Chronic kidney disease, stage 4 (severe): Secondary | ICD-10-CM | POA: Diagnosis not present

## 2019-04-13 DIAGNOSIS — N119 Chronic tubulo-interstitial nephritis, unspecified: Secondary | ICD-10-CM | POA: Diagnosis not present

## 2019-04-13 DIAGNOSIS — E1122 Type 2 diabetes mellitus with diabetic chronic kidney disease: Secondary | ICD-10-CM | POA: Diagnosis not present

## 2019-04-16 DIAGNOSIS — E538 Deficiency of other specified B group vitamins: Secondary | ICD-10-CM | POA: Diagnosis not present

## 2019-04-18 DIAGNOSIS — M1712 Unilateral primary osteoarthritis, left knee: Secondary | ICD-10-CM | POA: Diagnosis not present

## 2019-04-18 DIAGNOSIS — M25562 Pain in left knee: Secondary | ICD-10-CM | POA: Diagnosis not present

## 2019-04-19 ENCOUNTER — Other Ambulatory Visit: Payer: Self-pay

## 2019-04-19 ENCOUNTER — Encounter (HOSPITAL_COMMUNITY): Payer: Self-pay

## 2019-04-19 ENCOUNTER — Encounter (HOSPITAL_COMMUNITY)
Admission: RE | Admit: 2019-04-19 | Discharge: 2019-04-19 | Disposition: A | Discharge status: Home / Self Care | Payer: Medicare HMO | Source: Ambulatory Visit | Attending: Internal Medicine | Admitting: Internal Medicine

## 2019-04-19 DIAGNOSIS — D631 Anemia in chronic kidney disease: Secondary | ICD-10-CM | POA: Diagnosis not present

## 2019-04-19 DIAGNOSIS — N184 Chronic kidney disease, stage 4 (severe): Secondary | ICD-10-CM | POA: Diagnosis not present

## 2019-04-19 LAB — RENAL FUNCTION PANEL
Albumin: 2.6 g/dL — ABNORMAL LOW (ref 3.5–5.0)
Anion gap: 9 (ref 5–15)
BUN: 29 mg/dL — ABNORMAL HIGH (ref 8–23)
CO2: 24 mmol/L (ref 22–32)
Calcium: 13 mg/dL — ABNORMAL HIGH (ref 8.9–10.3)
Chloride: 105 mmol/L (ref 98–111)
Creatinine, Ser: 3.06 mg/dL — ABNORMAL HIGH (ref 0.44–1.00)
GFR calc Af Amer: 17 mL/min — ABNORMAL LOW (ref >60)
GFR calc non Af Amer: 15 mL/min — ABNORMAL LOW (ref >60)
Glucose, Bld: 186 mg/dL — ABNORMAL HIGH (ref 70–99)
Phosphorus: 3.6 mg/dL (ref 2.5–4.6)
Potassium: 2.9 mmol/L — ABNORMAL LOW (ref 3.5–5.1)
Sodium: 138 mmol/L (ref 135–145)

## 2019-04-19 MED ORDER — SODIUM CHLORIDE 0.9 % IV SOLN
510.0000 mg | Freq: Once | INTRAVENOUS | Status: AC
  Administered 2019-04-19: 510 mg via INTRAVENOUS
  Filled 2019-04-19: qty 17

## 2019-04-19 MED ORDER — SODIUM CHLORIDE 0.9 % IV SOLN
Freq: Once | INTRAVENOUS | Status: AC
  Administered 2019-04-19: 14:00:00 via INTRAVENOUS

## 2019-04-24 ENCOUNTER — Other Ambulatory Visit: Payer: Self-pay

## 2019-04-24 ENCOUNTER — Encounter (HOSPITAL_COMMUNITY): Payer: Self-pay

## 2019-04-24 ENCOUNTER — Encounter (HOSPITAL_COMMUNITY)
Admission: RE | Admit: 2019-04-24 | Discharge: 2019-04-24 | Disposition: A | Discharge status: Home / Self Care | Payer: Medicare HMO | Source: Ambulatory Visit | Attending: Internal Medicine | Admitting: Internal Medicine

## 2019-04-24 DIAGNOSIS — D631 Anemia in chronic kidney disease: Secondary | ICD-10-CM | POA: Diagnosis not present

## 2019-04-24 DIAGNOSIS — N184 Chronic kidney disease, stage 4 (severe): Secondary | ICD-10-CM | POA: Diagnosis not present

## 2019-04-24 MED ORDER — SODIUM CHLORIDE 0.9 % IV SOLN
Freq: Once | INTRAVENOUS | Status: AC
  Administered 2019-04-24: 09:00:00 via INTRAVENOUS

## 2019-04-24 MED ORDER — SODIUM CHLORIDE 0.9 % IV SOLN
510.0000 mg | Freq: Once | INTRAVENOUS | Status: AC
  Administered 2019-04-24: 510 mg via INTRAVENOUS
  Filled 2019-04-24: qty 17

## 2019-05-02 ENCOUNTER — Other Ambulatory Visit: Payer: Self-pay

## 2019-05-02 ENCOUNTER — Encounter (HOSPITAL_COMMUNITY)
Admission: RE | Admit: 2019-05-02 | Discharge: 2019-05-02 | Disposition: A | Discharge status: Home / Self Care | Payer: Medicare HMO | Source: Ambulatory Visit | Attending: Internal Medicine | Admitting: Internal Medicine

## 2019-05-02 ENCOUNTER — Encounter (HOSPITAL_COMMUNITY): Payer: Self-pay

## 2019-05-02 DIAGNOSIS — N184 Chronic kidney disease, stage 4 (severe): Secondary | ICD-10-CM | POA: Diagnosis present

## 2019-05-02 DIAGNOSIS — D631 Anemia in chronic kidney disease: Secondary | ICD-10-CM | POA: Diagnosis not present

## 2019-05-02 LAB — POCT HEMOGLOBIN-HEMACUE: Hemoglobin: 10.7 g/dL — ABNORMAL LOW (ref 12.0–15.0)

## 2019-05-02 LAB — COMPREHENSIVE METABOLIC PANEL
ALT: 18 U/L (ref 0–44)
AST: 15 U/L (ref 15–41)
Albumin: 3.5 g/dL (ref 3.5–5.0)
Alkaline Phosphatase: 83 U/L (ref 38–126)
Anion gap: 12 (ref 5–15)
BUN: 42 mg/dL — ABNORMAL HIGH (ref 8–23)
CO2: 22 mmol/L (ref 22–32)
Calcium: 10.1 mg/dL (ref 8.9–10.3)
Chloride: 105 mmol/L (ref 98–111)
Creatinine, Ser: 3.95 mg/dL — ABNORMAL HIGH (ref 0.44–1.00)
GFR calc Af Amer: 12 mL/min — ABNORMAL LOW (ref >60)
GFR calc non Af Amer: 11 mL/min — ABNORMAL LOW (ref >60)
Glucose, Bld: 281 mg/dL — ABNORMAL HIGH (ref 70–99)
Potassium: 4.2 mmol/L (ref 3.5–5.1)
Sodium: 139 mmol/L (ref 135–145)
Total Bilirubin: 0.6 mg/dL (ref 0.3–1.2)
Total Protein: 7 g/dL (ref 6.5–8.1)

## 2019-05-02 LAB — IRON AND TIBC
Iron: 74 ug/dL (ref 28–170)
Saturation Ratios: 32 % — ABNORMAL HIGH (ref 10.4–31.8)
TIBC: 234 ug/dL — ABNORMAL LOW (ref 250–450)
UIBC: 160 ug/dL

## 2019-05-02 LAB — FERRITIN: Ferritin: 929 ng/mL — ABNORMAL HIGH (ref 11–307)

## 2019-05-02 MED ORDER — EPOETIN ALFA-EPBX 2000 UNIT/ML IJ SOLN
2000.0000 [IU] | Freq: Once | INTRAMUSCULAR | Status: AC
  Administered 2019-05-02: 2000 [IU] via SUBCUTANEOUS
  Filled 2019-05-02: qty 1

## 2019-05-02 MED ORDER — EPOETIN ALFA-EPBX 10000 UNIT/ML IJ SOLN
10000.0000 [IU] | Freq: Once | INTRAMUSCULAR | Status: AC
  Administered 2019-05-02: 10000 [IU] via SUBCUTANEOUS
  Filled 2019-05-02: qty 1

## 2019-05-02 MED ORDER — EPOETIN ALFA-EPBX 3000 UNIT/ML IJ SOLN
3000.0000 [IU] | Freq: Once | INTRAMUSCULAR | Status: AC
  Administered 2019-05-02: 3000 [IU] via SUBCUTANEOUS
  Filled 2019-05-02: qty 1

## 2019-05-03 DIAGNOSIS — M1712 Unilateral primary osteoarthritis, left knee: Secondary | ICD-10-CM | POA: Diagnosis not present

## 2019-05-03 DIAGNOSIS — M25562 Pain in left knee: Secondary | ICD-10-CM | POA: Diagnosis not present

## 2019-05-08 DIAGNOSIS — R69 Illness, unspecified: Secondary | ICD-10-CM | POA: Diagnosis not present

## 2019-05-09 ENCOUNTER — Other Ambulatory Visit: Payer: Self-pay | Admitting: Geriatric Medicine

## 2019-05-09 ENCOUNTER — Ambulatory Visit
Admission: RE | Admit: 2019-05-09 | Discharge: 2019-05-09 | Disposition: A | Discharge status: Home / Self Care | Payer: Medicare HMO | Source: Ambulatory Visit | Attending: Geriatric Medicine | Admitting: Geriatric Medicine

## 2019-05-09 DIAGNOSIS — R0789 Other chest pain: Secondary | ICD-10-CM

## 2019-05-09 DIAGNOSIS — M25562 Pain in left knee: Secondary | ICD-10-CM | POA: Diagnosis not present

## 2019-05-09 DIAGNOSIS — M1712 Unilateral primary osteoarthritis, left knee: Secondary | ICD-10-CM | POA: Diagnosis not present

## 2019-05-09 DIAGNOSIS — S2242XA Multiple fractures of ribs, left side, initial encounter for closed fracture: Secondary | ICD-10-CM | POA: Diagnosis not present

## 2019-05-16 DIAGNOSIS — D631 Anemia in chronic kidney disease: Secondary | ICD-10-CM | POA: Diagnosis not present

## 2019-05-16 DIAGNOSIS — N184 Chronic kidney disease, stage 4 (severe): Secondary | ICD-10-CM | POA: Diagnosis not present

## 2019-05-16 DIAGNOSIS — N185 Chronic kidney disease, stage 5: Secondary | ICD-10-CM | POA: Diagnosis not present

## 2019-05-16 DIAGNOSIS — E1122 Type 2 diabetes mellitus with diabetic chronic kidney disease: Secondary | ICD-10-CM | POA: Diagnosis not present

## 2019-05-16 DIAGNOSIS — N189 Chronic kidney disease, unspecified: Secondary | ICD-10-CM | POA: Diagnosis not present

## 2019-05-16 DIAGNOSIS — I120 Hypertensive chronic kidney disease with stage 5 chronic kidney disease or end stage renal disease: Secondary | ICD-10-CM | POA: Diagnosis not present

## 2019-05-18 DIAGNOSIS — M1712 Unilateral primary osteoarthritis, left knee: Secondary | ICD-10-CM | POA: Diagnosis not present

## 2019-05-18 DIAGNOSIS — M25562 Pain in left knee: Secondary | ICD-10-CM | POA: Diagnosis not present

## 2019-05-30 ENCOUNTER — Encounter (HOSPITAL_COMMUNITY): Payer: Self-pay

## 2019-05-30 ENCOUNTER — Encounter (HOSPITAL_COMMUNITY)
Admission: RE | Admit: 2019-05-30 | Discharge: 2019-05-30 | Disposition: A | Discharge status: Home / Self Care | Payer: Medicare HMO | Source: Ambulatory Visit | Attending: Internal Medicine | Admitting: Internal Medicine

## 2019-05-30 ENCOUNTER — Other Ambulatory Visit: Payer: Self-pay

## 2019-05-30 DIAGNOSIS — D631 Anemia in chronic kidney disease: Secondary | ICD-10-CM | POA: Diagnosis not present

## 2019-05-30 DIAGNOSIS — N184 Chronic kidney disease, stage 4 (severe): Secondary | ICD-10-CM | POA: Diagnosis not present

## 2019-05-30 LAB — FERRITIN: Ferritin: 599 ng/mL — ABNORMAL HIGH (ref 11–307)

## 2019-05-30 LAB — IRON AND TIBC
Iron: 105 ug/dL (ref 28–170)
Saturation Ratios: 39 % — ABNORMAL HIGH (ref 10.4–31.8)
TIBC: 266 ug/dL (ref 250–450)
UIBC: 161 ug/dL

## 2019-05-30 MED ORDER — EPOETIN ALFA-EPBX 10000 UNIT/ML IJ SOLN
INTRAMUSCULAR | Status: AC
  Filled 2019-05-30: qty 1

## 2019-05-30 MED ORDER — EPOETIN ALFA-EPBX 10000 UNIT/ML IJ SOLN
10000.0000 [IU] | Freq: Once | INTRAMUSCULAR | Status: AC
  Administered 2019-05-30: 10000 [IU] via SUBCUTANEOUS

## 2019-05-30 MED ORDER — EPOETIN ALFA-EPBX 3000 UNIT/ML IJ SOLN
INTRAMUSCULAR | Status: AC
  Filled 2019-05-30: qty 1

## 2019-05-30 MED ORDER — EPOETIN ALFA-EPBX 2000 UNIT/ML IJ SOLN
INTRAMUSCULAR | Status: AC
  Filled 2019-05-30: qty 1

## 2019-05-30 MED ORDER — EPOETIN ALFA-EPBX 10000 UNIT/ML IJ SOLN: 20000.0000 [IU] | Freq: Once | INTRAMUSCULAR | Status: DC

## 2019-05-30 MED ORDER — EPOETIN ALFA-EPBX 2000 UNIT/ML IJ SOLN
2000.0000 [IU] | Freq: Once | INTRAMUSCULAR | Status: AC
  Administered 2019-05-30: 2000 [IU] via SUBCUTANEOUS

## 2019-05-30 MED ORDER — EPOETIN ALFA-EPBX 3000 UNIT/ML IJ SOLN
3000.0000 [IU] | Freq: Once | INTRAMUSCULAR | Status: AC
  Administered 2019-05-30: 3000 [IU] via SUBCUTANEOUS

## 2019-05-30 MED ORDER — EPOETIN ALFA-EPBX 10000 UNIT/ML IJ SOLN: 15000.0000 [IU] | Freq: Once | INTRAMUSCULAR | Status: DC

## 2019-05-31 DIAGNOSIS — E7800 Pure hypercholesterolemia, unspecified: Secondary | ICD-10-CM | POA: Diagnosis not present

## 2019-05-31 DIAGNOSIS — Z79899 Other long term (current) drug therapy: Secondary | ICD-10-CM | POA: Diagnosis not present

## 2019-05-31 DIAGNOSIS — E538 Deficiency of other specified B group vitamins: Secondary | ICD-10-CM | POA: Diagnosis not present

## 2019-05-31 DIAGNOSIS — I129 Hypertensive chronic kidney disease with stage 1 through stage 4 chronic kidney disease, or unspecified chronic kidney disease: Secondary | ICD-10-CM | POA: Diagnosis not present

## 2019-05-31 DIAGNOSIS — E119 Type 2 diabetes mellitus without complications: Secondary | ICD-10-CM | POA: Diagnosis not present

## 2019-05-31 DIAGNOSIS — N184 Chronic kidney disease, stage 4 (severe): Secondary | ICD-10-CM | POA: Diagnosis not present

## 2019-05-31 DIAGNOSIS — E1121 Type 2 diabetes mellitus with diabetic nephropathy: Secondary | ICD-10-CM | POA: Diagnosis not present

## 2019-05-31 LAB — POCT HEMOGLOBIN-HEMACUE: Hemoglobin: 11.3 g/dL — ABNORMAL LOW (ref 12.0–15.0)

## 2019-06-10 DIAGNOSIS — R69 Illness, unspecified: Secondary | ICD-10-CM | POA: Diagnosis not present

## 2019-06-13 DIAGNOSIS — N119 Chronic tubulo-interstitial nephritis, unspecified: Secondary | ICD-10-CM | POA: Diagnosis not present

## 2019-06-13 DIAGNOSIS — D631 Anemia in chronic kidney disease: Secondary | ICD-10-CM | POA: Diagnosis not present

## 2019-06-13 DIAGNOSIS — I120 Hypertensive chronic kidney disease with stage 5 chronic kidney disease or end stage renal disease: Secondary | ICD-10-CM | POA: Diagnosis not present

## 2019-06-13 DIAGNOSIS — N185 Chronic kidney disease, stage 5: Secondary | ICD-10-CM | POA: Diagnosis not present

## 2019-06-13 DIAGNOSIS — E1122 Type 2 diabetes mellitus with diabetic chronic kidney disease: Secondary | ICD-10-CM | POA: Diagnosis not present

## 2019-06-24 DIAGNOSIS — R609 Edema, unspecified: Secondary | ICD-10-CM | POA: Diagnosis not present

## 2019-06-24 DIAGNOSIS — Z823 Family history of stroke: Secondary | ICD-10-CM | POA: Diagnosis not present

## 2019-06-24 DIAGNOSIS — I10 Essential (primary) hypertension: Secondary | ICD-10-CM | POA: Diagnosis not present

## 2019-06-24 DIAGNOSIS — Z7984 Long term (current) use of oral hypoglycemic drugs: Secondary | ICD-10-CM | POA: Diagnosis not present

## 2019-06-24 DIAGNOSIS — Z833 Family history of diabetes mellitus: Secondary | ICD-10-CM | POA: Diagnosis not present

## 2019-06-24 DIAGNOSIS — E119 Type 2 diabetes mellitus without complications: Secondary | ICD-10-CM | POA: Diagnosis not present

## 2019-06-24 DIAGNOSIS — R69 Illness, unspecified: Secondary | ICD-10-CM | POA: Diagnosis not present

## 2019-06-27 ENCOUNTER — Encounter (HOSPITAL_COMMUNITY)
Admission: RE | Admit: 2019-06-27 | Discharge: 2019-06-27 | Disposition: A | Discharge status: Home / Self Care | Payer: Medicare HMO | Source: Ambulatory Visit | Attending: Internal Medicine | Admitting: Internal Medicine

## 2019-06-27 ENCOUNTER — Other Ambulatory Visit: Payer: Self-pay

## 2019-06-27 DIAGNOSIS — D631 Anemia in chronic kidney disease: Secondary | ICD-10-CM | POA: Diagnosis not present

## 2019-06-27 DIAGNOSIS — N184 Chronic kidney disease, stage 4 (severe): Secondary | ICD-10-CM | POA: Insufficient documentation

## 2019-06-27 LAB — RENAL FUNCTION PANEL
Albumin: 4 g/dL (ref 3.5–5.0)
Anion gap: 12 (ref 5–15)
BUN: 44 mg/dL — ABNORMAL HIGH (ref 8–23)
CO2: 25 mmol/L (ref 22–32)
Calcium: 9.8 mg/dL (ref 8.9–10.3)
Chloride: 101 mmol/L (ref 98–111)
Creatinine, Ser: 2.99 mg/dL — ABNORMAL HIGH (ref 0.44–1.00)
GFR calc Af Amer: 17 mL/min — ABNORMAL LOW (ref >60)
GFR calc non Af Amer: 15 mL/min — ABNORMAL LOW (ref >60)
Glucose, Bld: 123 mg/dL — ABNORMAL HIGH (ref 70–99)
Phosphorus: 4.6 mg/dL (ref 2.5–4.6)
Potassium: 4 mmol/L (ref 3.5–5.1)
Sodium: 138 mmol/L (ref 135–145)

## 2019-06-27 LAB — IRON AND TIBC
Iron: 77 ug/dL (ref 28–170)
Saturation Ratios: 28 % (ref 10.4–31.8)
TIBC: 276 ug/dL (ref 250–450)
UIBC: 199 ug/dL

## 2019-06-27 LAB — FERRITIN: Ferritin: 428 ng/mL — ABNORMAL HIGH (ref 11–307)

## 2019-06-27 LAB — POCT HEMOGLOBIN-HEMACUE: Hemoglobin: 11.2 g/dL — ABNORMAL LOW (ref 12.0–15.0)

## 2019-06-27 MED ORDER — EPOETIN ALFA-EPBX 10000 UNIT/ML IJ SOLN
10000.0000 [IU] | Freq: Once | INTRAMUSCULAR | Status: AC
  Administered 2019-06-27: 10000 [IU] via SUBCUTANEOUS

## 2019-06-27 MED ORDER — EPOETIN ALFA-EPBX 10000 UNIT/ML IJ SOLN
INTRAMUSCULAR | Status: AC
  Filled 2019-06-27: qty 1

## 2019-06-28 LAB — PTH, INTACT AND CALCIUM
Calcium, Total (PTH): 10.1 mg/dL (ref 8.7–10.3)
PTH: 18 pg/mL (ref 15–65)

## 2019-07-04 DIAGNOSIS — E538 Deficiency of other specified B group vitamins: Secondary | ICD-10-CM | POA: Diagnosis not present

## 2019-07-05 DIAGNOSIS — I10 Essential (primary) hypertension: Secondary | ICD-10-CM | POA: Diagnosis not present

## 2019-07-05 DIAGNOSIS — N185 Chronic kidney disease, stage 5: Secondary | ICD-10-CM | POA: Diagnosis not present

## 2019-07-05 DIAGNOSIS — D649 Anemia, unspecified: Secondary | ICD-10-CM | POA: Diagnosis not present

## 2019-07-05 DIAGNOSIS — E1122 Type 2 diabetes mellitus with diabetic chronic kidney disease: Secondary | ICD-10-CM | POA: Diagnosis not present

## 2019-07-12 DIAGNOSIS — R69 Illness, unspecified: Secondary | ICD-10-CM | POA: Diagnosis not present

## 2019-07-24 DIAGNOSIS — N184 Chronic kidney disease, stage 4 (severe): Secondary | ICD-10-CM | POA: Diagnosis not present

## 2019-07-24 DIAGNOSIS — E119 Type 2 diabetes mellitus without complications: Secondary | ICD-10-CM | POA: Diagnosis not present

## 2019-07-24 DIAGNOSIS — E7800 Pure hypercholesterolemia, unspecified: Secondary | ICD-10-CM | POA: Diagnosis not present

## 2019-07-24 DIAGNOSIS — I129 Hypertensive chronic kidney disease with stage 1 through stage 4 chronic kidney disease, or unspecified chronic kidney disease: Secondary | ICD-10-CM | POA: Diagnosis not present

## 2019-07-24 DIAGNOSIS — E1121 Type 2 diabetes mellitus with diabetic nephropathy: Secondary | ICD-10-CM | POA: Diagnosis not present

## 2019-07-25 ENCOUNTER — Other Ambulatory Visit: Payer: Self-pay

## 2019-07-25 ENCOUNTER — Encounter (HOSPITAL_COMMUNITY)
Admission: RE | Admit: 2019-07-25 | Discharge: 2019-07-25 | Disposition: A | Discharge status: Home / Self Care | Payer: Medicare HMO | Source: Ambulatory Visit | Attending: Internal Medicine | Admitting: Internal Medicine

## 2019-07-25 ENCOUNTER — Encounter (HOSPITAL_COMMUNITY): Payer: Self-pay

## 2019-07-25 DIAGNOSIS — I120 Hypertensive chronic kidney disease with stage 5 chronic kidney disease or end stage renal disease: Secondary | ICD-10-CM | POA: Diagnosis not present

## 2019-07-25 DIAGNOSIS — N185 Chronic kidney disease, stage 5: Secondary | ICD-10-CM | POA: Diagnosis not present

## 2019-07-25 DIAGNOSIS — D631 Anemia in chronic kidney disease: Secondary | ICD-10-CM | POA: Diagnosis not present

## 2019-07-25 DIAGNOSIS — N119 Chronic tubulo-interstitial nephritis, unspecified: Secondary | ICD-10-CM | POA: Diagnosis not present

## 2019-07-25 DIAGNOSIS — E1122 Type 2 diabetes mellitus with diabetic chronic kidney disease: Secondary | ICD-10-CM | POA: Diagnosis not present

## 2019-07-25 LAB — IRON AND TIBC
Iron: 71 ug/dL (ref 28–170)
Saturation Ratios: 26 % (ref 10.4–31.8)
TIBC: 271 ug/dL (ref 250–450)
UIBC: 200 ug/dL

## 2019-07-25 LAB — FERRITIN: Ferritin: 336 ng/mL — ABNORMAL HIGH (ref 11–307)

## 2019-07-25 LAB — POCT HEMOGLOBIN-HEMACUE: Hemoglobin: 10.5 g/dL — ABNORMAL LOW (ref 12.0–15.0)

## 2019-07-25 MED ORDER — EPOETIN ALFA-EPBX 10000 UNIT/ML IJ SOLN
10000.0000 [IU] | Freq: Once | INTRAMUSCULAR | Status: AC
  Administered 2019-07-25: 10000 [IU] via SUBCUTANEOUS
  Filled 2019-07-25: qty 1

## 2019-08-01 ENCOUNTER — Encounter (HOSPITAL_COMMUNITY): Payer: Medicare HMO

## 2019-08-01 ENCOUNTER — Other Ambulatory Visit (HOSPITAL_COMMUNITY): Payer: Medicare HMO

## 2019-08-01 ENCOUNTER — Encounter: Payer: Medicare HMO | Admitting: Vascular Surgery

## 2019-08-02 DIAGNOSIS — E538 Deficiency of other specified B group vitamins: Secondary | ICD-10-CM | POA: Diagnosis not present

## 2019-08-16 DIAGNOSIS — R69 Illness, unspecified: Secondary | ICD-10-CM | POA: Diagnosis not present

## 2019-08-21 DIAGNOSIS — N184 Chronic kidney disease, stage 4 (severe): Secondary | ICD-10-CM | POA: Diagnosis not present

## 2019-08-21 DIAGNOSIS — E1121 Type 2 diabetes mellitus with diabetic nephropathy: Secondary | ICD-10-CM | POA: Diagnosis not present

## 2019-08-21 DIAGNOSIS — E119 Type 2 diabetes mellitus without complications: Secondary | ICD-10-CM | POA: Diagnosis not present

## 2019-08-21 DIAGNOSIS — E7800 Pure hypercholesterolemia, unspecified: Secondary | ICD-10-CM | POA: Diagnosis not present

## 2019-08-21 DIAGNOSIS — I129 Hypertensive chronic kidney disease with stage 1 through stage 4 chronic kidney disease, or unspecified chronic kidney disease: Secondary | ICD-10-CM | POA: Diagnosis not present

## 2019-08-22 ENCOUNTER — Encounter (HOSPITAL_COMMUNITY): Admission: RE | Admit: 2019-08-22 | Payer: Medicare HMO | Source: Ambulatory Visit

## 2019-08-22 ENCOUNTER — Encounter (HOSPITAL_COMMUNITY): Payer: Medicare HMO

## 2019-08-23 ENCOUNTER — Encounter (HOSPITAL_COMMUNITY): Payer: Self-pay

## 2019-08-23 ENCOUNTER — Encounter (HOSPITAL_COMMUNITY)
Admission: RE | Admit: 2019-08-23 | Discharge: 2019-08-23 | Disposition: A | Discharge status: Home / Self Care | Payer: Medicare HMO | Source: Ambulatory Visit | Attending: Internal Medicine | Admitting: Internal Medicine

## 2019-08-23 ENCOUNTER — Other Ambulatory Visit: Payer: Self-pay

## 2019-08-23 DIAGNOSIS — N185 Chronic kidney disease, stage 5: Secondary | ICD-10-CM | POA: Diagnosis not present

## 2019-08-23 DIAGNOSIS — D631 Anemia in chronic kidney disease: Secondary | ICD-10-CM | POA: Diagnosis not present

## 2019-08-23 LAB — RENAL FUNCTION PANEL
Albumin: 3.8 g/dL (ref 3.5–5.0)
Anion gap: 11 (ref 5–15)
BUN: 36 mg/dL — ABNORMAL HIGH (ref 8–23)
CO2: 25 mmol/L (ref 22–32)
Calcium: 9 mg/dL (ref 8.9–10.3)
Chloride: 104 mmol/L (ref 98–111)
Creatinine, Ser: 2.53 mg/dL — ABNORMAL HIGH (ref 0.44–1.00)
GFR calc Af Amer: 21 mL/min — ABNORMAL LOW (ref >60)
GFR calc non Af Amer: 18 mL/min — ABNORMAL LOW (ref >60)
Glucose, Bld: 208 mg/dL — ABNORMAL HIGH (ref 70–99)
Phosphorus: 3.9 mg/dL (ref 2.5–4.6)
Potassium: 3.4 mmol/L — ABNORMAL LOW (ref 3.5–5.1)
Sodium: 140 mmol/L (ref 135–145)

## 2019-08-23 LAB — POCT HEMOGLOBIN-HEMACUE: Hemoglobin: 10.7 g/dL — ABNORMAL LOW (ref 12.0–15.0)

## 2019-08-23 LAB — FERRITIN: Ferritin: 261 ng/mL (ref 11–307)

## 2019-08-23 LAB — IRON AND TIBC
Iron: 54 ug/dL (ref 28–170)
Saturation Ratios: 22 % (ref 10.4–31.8)
TIBC: 244 ug/dL — ABNORMAL LOW (ref 250–450)
UIBC: 190 ug/dL

## 2019-08-23 MED ORDER — EPOETIN ALFA-EPBX 10000 UNIT/ML IJ SOLN
10000.0000 [IU] | Freq: Once | INTRAMUSCULAR | Status: AC
  Administered 2019-08-23: 10000 [IU] via SUBCUTANEOUS
  Filled 2019-08-23: qty 1

## 2019-08-24 LAB — PARATHYROID HORMONE, INTACT (NO CA): PTH: 44 pg/mL (ref 15–65)

## 2019-08-29 DIAGNOSIS — M25562 Pain in left knee: Secondary | ICD-10-CM | POA: Diagnosis not present

## 2019-08-29 DIAGNOSIS — M222X2 Patellofemoral disorders, left knee: Secondary | ICD-10-CM | POA: Diagnosis not present

## 2019-08-29 DIAGNOSIS — M1712 Unilateral primary osteoarthritis, left knee: Secondary | ICD-10-CM | POA: Diagnosis not present

## 2019-08-29 DIAGNOSIS — M25762 Osteophyte, left knee: Secondary | ICD-10-CM | POA: Diagnosis not present

## 2019-08-29 DIAGNOSIS — M170 Bilateral primary osteoarthritis of knee: Secondary | ICD-10-CM | POA: Diagnosis not present

## 2019-09-03 DIAGNOSIS — M25762 Osteophyte, left knee: Secondary | ICD-10-CM | POA: Diagnosis not present

## 2019-09-03 DIAGNOSIS — E538 Deficiency of other specified B group vitamins: Secondary | ICD-10-CM | POA: Diagnosis not present

## 2019-09-03 DIAGNOSIS — M1712 Unilateral primary osteoarthritis, left knee: Secondary | ICD-10-CM | POA: Diagnosis not present

## 2019-09-03 DIAGNOSIS — M25562 Pain in left knee: Secondary | ICD-10-CM | POA: Diagnosis not present

## 2019-09-10 DIAGNOSIS — M25762 Osteophyte, left knee: Secondary | ICD-10-CM | POA: Diagnosis not present

## 2019-09-10 DIAGNOSIS — M1712 Unilateral primary osteoarthritis, left knee: Secondary | ICD-10-CM | POA: Diagnosis not present

## 2019-09-10 DIAGNOSIS — M222X2 Patellofemoral disorders, left knee: Secondary | ICD-10-CM | POA: Diagnosis not present

## 2019-09-10 DIAGNOSIS — M25562 Pain in left knee: Secondary | ICD-10-CM | POA: Diagnosis not present

## 2019-09-14 ENCOUNTER — Other Ambulatory Visit: Payer: Self-pay | Admitting: *Deleted

## 2019-09-14 DIAGNOSIS — N186 End stage renal disease: Secondary | ICD-10-CM

## 2019-09-14 DIAGNOSIS — N185 Chronic kidney disease, stage 5: Secondary | ICD-10-CM | POA: Diagnosis not present

## 2019-09-14 DIAGNOSIS — N119 Chronic tubulo-interstitial nephritis, unspecified: Secondary | ICD-10-CM | POA: Diagnosis not present

## 2019-09-14 DIAGNOSIS — D631 Anemia in chronic kidney disease: Secondary | ICD-10-CM | POA: Diagnosis not present

## 2019-09-14 DIAGNOSIS — E1122 Type 2 diabetes mellitus with diabetic chronic kidney disease: Secondary | ICD-10-CM | POA: Diagnosis not present

## 2019-09-14 DIAGNOSIS — I120 Hypertensive chronic kidney disease with stage 5 chronic kidney disease or end stage renal disease: Secondary | ICD-10-CM | POA: Diagnosis not present

## 2019-09-19 DIAGNOSIS — R69 Illness, unspecified: Secondary | ICD-10-CM | POA: Diagnosis not present

## 2019-09-20 ENCOUNTER — Encounter (HOSPITAL_COMMUNITY): Payer: Self-pay

## 2019-09-20 ENCOUNTER — Encounter (HOSPITAL_COMMUNITY)
Admission: RE | Admit: 2019-09-20 | Discharge: 2019-09-20 | Disposition: A | Discharge status: Home / Self Care | Payer: Medicare HMO | Source: Ambulatory Visit | Attending: Internal Medicine | Admitting: Internal Medicine

## 2019-09-20 ENCOUNTER — Telehealth (HOSPITAL_COMMUNITY): Payer: Self-pay

## 2019-09-20 ENCOUNTER — Other Ambulatory Visit: Payer: Self-pay

## 2019-09-20 DIAGNOSIS — M1712 Unilateral primary osteoarthritis, left knee: Secondary | ICD-10-CM | POA: Diagnosis not present

## 2019-09-20 DIAGNOSIS — R2689 Other abnormalities of gait and mobility: Secondary | ICD-10-CM | POA: Diagnosis not present

## 2019-09-20 DIAGNOSIS — D631 Anemia in chronic kidney disease: Secondary | ICD-10-CM | POA: Insufficient documentation

## 2019-09-20 DIAGNOSIS — N185 Chronic kidney disease, stage 5: Secondary | ICD-10-CM | POA: Diagnosis present

## 2019-09-20 DIAGNOSIS — M25762 Osteophyte, left knee: Secondary | ICD-10-CM | POA: Diagnosis not present

## 2019-09-20 DIAGNOSIS — M25562 Pain in left knee: Secondary | ICD-10-CM | POA: Diagnosis not present

## 2019-09-20 DIAGNOSIS — M222X2 Patellofemoral disorders, left knee: Secondary | ICD-10-CM | POA: Diagnosis not present

## 2019-09-20 LAB — IRON AND TIBC
Iron: 62 ug/dL (ref 28–170)
Saturation Ratios: 27 % (ref 10.4–31.8)
TIBC: 228 ug/dL — ABNORMAL LOW (ref 250–450)
UIBC: 166 ug/dL

## 2019-09-20 LAB — RENAL FUNCTION PANEL
Albumin: 4.1 g/dL (ref 3.5–5.0)
Anion gap: 14 (ref 5–15)
BUN: 38 mg/dL — ABNORMAL HIGH (ref 8–23)
CO2: 21 mmol/L — ABNORMAL LOW (ref 22–32)
Calcium: 9.6 mg/dL (ref 8.9–10.3)
Chloride: 106 mmol/L (ref 98–111)
Creatinine, Ser: 2.74 mg/dL — ABNORMAL HIGH (ref 0.44–1.00)
GFR calc Af Amer: 19 mL/min — ABNORMAL LOW (ref >60)
GFR calc non Af Amer: 17 mL/min — ABNORMAL LOW (ref >60)
Glucose, Bld: 243 mg/dL — ABNORMAL HIGH (ref 70–99)
Phosphorus: 3.3 mg/dL (ref 2.5–4.6)
Potassium: 3.7 mmol/L (ref 3.5–5.1)
Sodium: 141 mmol/L (ref 135–145)

## 2019-09-20 LAB — FERRITIN: Ferritin: 320 ng/mL — ABNORMAL HIGH (ref 11–307)

## 2019-09-20 LAB — POCT HEMOGLOBIN-HEMACUE: Hemoglobin: 11.5 g/dL — ABNORMAL LOW (ref 12.0–15.0)

## 2019-09-20 MED ORDER — EPOETIN ALFA-EPBX 10000 UNIT/ML IJ SOLN
10000.0000 [IU] | Freq: Once | INTRAMUSCULAR | Status: AC
  Administered 2019-09-20: 10000 [IU] via SUBCUTANEOUS

## 2019-09-20 MED ORDER — EPOETIN ALFA-EPBX 10000 UNIT/ML IJ SOLN
INTRAMUSCULAR | Status: AC
  Filled 2019-09-20: qty 1

## 2019-09-20 NOTE — Telephone Encounter (Signed)

## 2019-09-21 ENCOUNTER — Encounter: Payer: Self-pay | Admitting: Vascular Surgery

## 2019-09-21 ENCOUNTER — Other Ambulatory Visit: Payer: Self-pay

## 2019-09-21 ENCOUNTER — Ambulatory Visit (INDEPENDENT_AMBULATORY_CARE_PROVIDER_SITE_OTHER): Payer: Medicare HMO | Admitting: Vascular Surgery

## 2019-09-21 ENCOUNTER — Ambulatory Visit (INDEPENDENT_AMBULATORY_CARE_PROVIDER_SITE_OTHER)
Admission: RE | Admit: 2019-09-21 | Discharge: 2019-09-21 | Disposition: A | Discharge status: Home / Self Care | Payer: Medicare HMO | Source: Ambulatory Visit | Attending: Vascular Surgery | Admitting: Vascular Surgery

## 2019-09-21 ENCOUNTER — Ambulatory Visit (HOSPITAL_COMMUNITY)
Admission: RE | Admit: 2019-09-21 | Discharge: 2019-09-21 | Disposition: A | Discharge status: Home / Self Care | Payer: Medicare HMO | Source: Ambulatory Visit | Attending: Vascular Surgery | Admitting: Vascular Surgery

## 2019-09-21 VITALS — BP 143/80 | HR 90 | Temp 97.3°F | Resp 20 | Ht 64.0 in | Wt 181.0 lb

## 2019-09-21 DIAGNOSIS — N185 Chronic kidney disease, stage 5: Secondary | ICD-10-CM

## 2019-09-21 DIAGNOSIS — N186 End stage renal disease: Secondary | ICD-10-CM | POA: Diagnosis not present

## 2019-09-21 DIAGNOSIS — R69 Illness, unspecified: Secondary | ICD-10-CM | POA: Diagnosis not present

## 2019-09-21 LAB — PARATHYROID HORMONE, INTACT (NO CA): PTH: 44 pg/mL (ref 15–65)

## 2019-09-21 NOTE — Progress Notes (Signed)
Patient ID: Beth Malone, female   DOB: 06-01-1945, 74 y.o.   MRN: [id number]  Reason for Consult: New Patient (Initial Visit)   Referred by Lajean Manes, MD  Subjective:     HPI:  Tommie Malone is a 74 y.o. female history of chronic kidney disease stage V.  She has never been on dialysis.  She has never had upper extremity or chest or breast surgery on either side.  No pacemakers no previous Port-A-Cath placement.  She is right-hand dominant.  She has had vein mapping and arterial duplexes prior to today's visit.  She has no complaints related to today's visit.  Patient does not want to start dialysis but understands that she may need to in the near future.  Past Medical History:  Diagnosis Date  . Anemia   . Anxiety   . Arthritis   . Chronic kidney disease   . Diabetes mellitus   . Dysphagia    last year or so   . Femur fracture ([place])   . Hypertension    Family History  Family history unknown: Yes   Past Surgical History:  Procedure Laterality Date  . ABDOMINAL HYSTERECTOMY     complete  . BALLOON DILATION N/A 03/22/2016   Procedure: BALLOON DILATION;  Surgeon: [name], MD;  Location: [facility];  Service: Endoscopy;  Laterality: N/A;  . colonscopy  2014  . ESOPHAGEAL MANOMETRY N/A 04/05/2016   Procedure: ESOPHAGEAL MANOMETRY (EM);  Surgeon: [name], MD;  Location: [facility];  Service: Endoscopy;  Laterality: N/A;  . ESOPHAGOGASTRODUODENOSCOPY (EGD) WITH PROPOFOL N/A 03/22/2016   Procedure: ESOPHAGOGASTRODUODENOSCOPY (EGD) WITH PROPOFOL;  Surgeon: [name], MD;  Location: [facility];  Service: Endoscopy;  Laterality: N/A;  . ESOPHAGOGASTRODUODENOSCOPY (EGD) WITH PROPOFOL N/A 06/01/2016   Procedure: ESOPHAGOGASTRODUODENOSCOPY (EGD) WITH PROPOFOL;  Surgeon: [name], MD;  Location: [facility];  Service: Endoscopy;  Laterality: N/A;  . FEMUR SURGERY Right 2011   rod placed  . TUBAL LIGATION      Short Social  History:  Social History   Tobacco Use  . Smoking status: Never Smoker  . Smokeless tobacco: Never Used  Substance Use Topics  . Alcohol use: Yes    Comment: rarely drink wine    Allergies  Allergen Reactions  . Augmentin [Amoxicillin-Pot Clavulanate] Nausea And Vomiting  . Codeine Itching and Other (See Comments)    Don't sleep    Current Outpatient Medications  Medication Sig Dispense Refill  . ALPRAZolam (XANAX) 0.25 MG tablet Take 0.25 mg by mouth daily as needed for anxiety. anxiety     . amLODipine (NORVASC) 10 MG tablet Take 10 mg by mouth daily.    Marland Kitchen atorvastatin (LIPITOR) 20 MG tablet Take 20 mg by mouth at bedtime.     . cetirizine (ZYRTEC) 10 MG tablet Take 10 mg by mouth daily as needed for allergies.     Marland Kitchen epoetin alfa-epbx (RETACRIT) 86761 UNIT/ML injection 10,000 Units every 30 (thirty) days.     Marland Kitchen epoetin alfa-epbx (RETACRIT) 95093 UNIT/ML injection Inject 15,000 Units into the skin.     . famotidine (PEPCID) 20 MG tablet Take 20 mg by mouth 2 (two) times daily.    . fluticasone (FLONASE) 50 MCG/ACT nasal spray Place 1 spray into both nostrils daily as needed for allergies or rhinitis.    . furosemide (LASIX) 20 MG tablet Take 40 mg by mouth 2 (two) times daily.     Marland Kitchen glipiZIDE (GLUCOTROL) 5 MG  tablet Take 2.5 mg by mouth daily before breakfast.     . Metoprolol Succinate 50 MG CS24 Take 50 mg by mouth daily.     . Potassium Chloride ER 20 MEQ TBCR Take 2 tablets by mouth daily.    . Turmeric 500 MG TABS Take 1 tablet by mouth 2 (two) times daily.     . Melatonin 10 MG/ML LIQD Take 10 mLs by mouth.     No current facility-administered medications for this visit.    Review of Systems  Constitutional:  Constitutional negative. HENT: HENT negative.  Eyes: Eyes negative.  Respiratory: Respiratory negative.  Cardiovascular: Positive for leg swelling.  GI: Gastrointestinal negative.  Musculoskeletal: Musculoskeletal negative.  Skin: Skin negative.   Neurological: Neurological negative. Hematologic: Hematologic/lymphatic negative.  Psychiatric: Psychiatric negative.        Objective:  Objective   Vitals:   09/21/19 1037  BP: (!) 143/80  Pulse: 90  Resp: 20  Temp: (!) 97.3 F (36.3 C)  SpO2: 96%  Weight: 181 lb (82.1 kg)  Height: 5\' 4"  (1.626 m)   Body mass index is 31.07 kg/m.  Physical Exam HENT:     Head: Normocephalic.     Nose:     Comments: Mask in place Eyes:     Pupils: Pupils are equal, round, and reactive to light.  Cardiovascular:     Rate and Rhythm: Normal rate.     Pulses:          Radial pulses are 2+ on the right side and 2+ on the left side.  Pulmonary:     Effort: Pulmonary effort is normal.  Abdominal:     General: Abdomen is flat.     Palpations: Abdomen is soft.  Musculoskeletal:        General: No swelling. Normal range of motion.     Cervical back: Normal range of motion and neck supple. No tenderness.  Skin:    Capillary Refill: Capillary refill takes less than 2 seconds.  Neurological:     General: No focal deficit present.     Mental Status: She is alert.  Psychiatric:        Mood and Affect: Mood normal.        Thought Content: Thought content normal.        Judgment: Judgment normal.     Data: I have independently interpreted her bilateral upper extremity vein mapping.  On the right side she has suitable basilic and marginal cephalic vein for dialysis access creation.  On the left side she appears to have both suitable cephalic and basilic vein.  I have independently interpreted her bilateral upper extremity triple duplex.  Right side brachial artery 0.43 cm and triphasic on the left side 0.38 cm and triphasic.  On both sides bifurcation is at the antecubital fossa.     Assessment/Plan:     74 year old female with chronic kidney disease stage V.  She is now indicated for AV fistula creation.  She appears to have suitable vein bilaterally.  She is right-hand dominant.  We  will start with cephalic or basilic vein fistula on the left.  We have discussed the risk benefits alternatives she demonstrates good understanding we will get her scheduled today.     [name] MD [facility]

## 2019-09-26 DIAGNOSIS — M25562 Pain in left knee: Secondary | ICD-10-CM | POA: Diagnosis not present

## 2019-09-26 DIAGNOSIS — R2689 Other abnormalities of gait and mobility: Secondary | ICD-10-CM | POA: Diagnosis not present

## 2019-09-26 DIAGNOSIS — K220 Achalasia of cardia: Secondary | ICD-10-CM | POA: Diagnosis not present

## 2019-09-26 DIAGNOSIS — M1712 Unilateral primary osteoarthritis, left knee: Secondary | ICD-10-CM | POA: Diagnosis not present

## 2019-09-26 DIAGNOSIS — M25762 Osteophyte, left knee: Secondary | ICD-10-CM | POA: Diagnosis not present

## 2019-09-27 DIAGNOSIS — R011 Cardiac murmur, unspecified: Secondary | ICD-10-CM | POA: Diagnosis not present

## 2019-09-27 DIAGNOSIS — N184 Chronic kidney disease, stage 4 (severe): Secondary | ICD-10-CM | POA: Diagnosis not present

## 2019-09-27 DIAGNOSIS — K220 Achalasia of cardia: Secondary | ICD-10-CM | POA: Diagnosis not present

## 2019-09-27 DIAGNOSIS — I129 Hypertensive chronic kidney disease with stage 1 through stage 4 chronic kidney disease, or unspecified chronic kidney disease: Secondary | ICD-10-CM | POA: Diagnosis not present

## 2019-09-27 DIAGNOSIS — E1121 Type 2 diabetes mellitus with diabetic nephropathy: Secondary | ICD-10-CM | POA: Diagnosis not present

## 2019-09-27 DIAGNOSIS — Z7984 Long term (current) use of oral hypoglycemic drugs: Secondary | ICD-10-CM | POA: Diagnosis not present

## 2019-09-28 ENCOUNTER — Other Ambulatory Visit (HOSPITAL_COMMUNITY): Payer: Self-pay | Admitting: Geriatric Medicine

## 2019-09-28 DIAGNOSIS — R011 Cardiac murmur, unspecified: Secondary | ICD-10-CM

## 2019-10-02 ENCOUNTER — Encounter (HOSPITAL_COMMUNITY): Payer: Self-pay | Admitting: Vascular Surgery

## 2019-10-02 ENCOUNTER — Other Ambulatory Visit: Payer: Self-pay

## 2019-10-02 ENCOUNTER — Other Ambulatory Visit (HOSPITAL_COMMUNITY)
Admission: RE | Admit: 2019-10-02 | Discharge: 2019-10-02 | Disposition: A | Discharge status: Home / Self Care | Payer: Medicare HMO | Source: Ambulatory Visit | Attending: Vascular Surgery | Admitting: Vascular Surgery

## 2019-10-02 DIAGNOSIS — Z20822 Contact with and (suspected) exposure to covid-19: Secondary | ICD-10-CM | POA: Diagnosis not present

## 2019-10-02 DIAGNOSIS — Z01812 Encounter for preprocedural laboratory examination: Secondary | ICD-10-CM | POA: Diagnosis not present

## 2019-10-02 NOTE — Progress Notes (Signed)
Anesthesia Chart Review: Beth Malone   Case: [id number] Date/Time: 10/03/19 0815   Procedure: LEFT ARM ARTERIOVENOUS (AV) FISTULA CREATION (Left )   Anesthesia type: Monitor Anesthesia Care   Pre-op diagnosis: CHRONIC KIDNEY DISEASE STAGE 5   Location: [facility] ROOM 12 / [facility]   Surgeons: [name], MD      DISCUSSION: Patient is a 74 year old female scheduled for the above procedure. She is not yet on hemodialysis.  History includes never smoker, HTN, murmur, DM2, GERD, CKD stage IV, anemia, dysphagia (normal esophagus, suspected achalasia on 03/22/16 EGD).   Patient is scheduled for an echo on 10/19/19 to evaluate systolic murmur (known history of murmur, which PCP Dr. Felipa Eth monitors periodically). By notes, mild AS on 01/2011 echo, but no AS was noted on 03/28/14 echo. There was "moderate pericardial effusion" noted on 12/19/18 CT scan.   She reported last A1c 6.8% [facility] Physician records pending).  She denied chest pain and SOB per PAT RN phone interview. She will need EKG on the day of surgery.   Discussed available information with anesthesiologist Annye Asa, MD. Anesthesia team evaluation on the day of surgery.  10/02/19 Preoperative COVID-19 test is in process.   VS: Ht 5\' 4"  (1.626 m)   Wt 82.6 kg   BMI 31.24 kg/m   BP Readings from Last 3 Encounters:  09/21/19 (!) 143/80  09/20/19 (!) 151/85  08/23/19 (!) 108/58   Pulse Readings from Last 3 Encounters:  09/21/19 90  09/20/19 95  08/23/19 85     PROVIDERS: Lajean Manes, MD his PCP Jannifer Hick, MD is nephrologist Sullivan Lone, MD is hematologist. Seen in 2020 for elevated kappa light chains, felt likely related to CKD. Advised future echo for pericardial effusion on 11/2018 CT. PRN hematology follow-up recommended.    LABS: For same day labs. As of 09/20/19, Cr 2.74, glucose 243.    IMAGES: CT chest/abd/pelvis 12/19/18: IMPRESSION: 1. No specific worrisome bony lesions to explain  the patient's hypercalcemia. There is a suspected hemangioma at L4 and possibly at L2. 2. 2 mm punctate calcification in the vicinity of the left UVJ suspicious for a nonobstructive calculus. No hydronephrosis or hydroureter. 3. Punctate calcification in the left kidney upper pole is likely along the inferior margin of a cyst. There is also a small peripheral hyperdense lesion in the left kidney upper pole measuring 4 mm in diameter, probably a complex cyst but technically too small to characterize. 4. 2 hypodense lesions in the right kidney are probably cysts based on low-density. 5. Contrast medium in the esophagus suggests motility or reflux. 6. Moderate pericardial effusion. 7. Aortic Atherosclerosis (ICD10-I70.0). Cholelithiasis. Lower lumbar impingement due to spondylosis and degenerative disc disease.   EKG: For day of surgery unless receive EKG done within the past year.    CV: Echo 03/28/14: Study Conclusions  - Left ventricle: The cavity size was normal. Systolic function was  normal. The estimated ejection fraction was in the range of 50%  to 55%. Wall motion was normal; there were no regional wall  motion abnormalities. Left ventricular diastolic function  parameters were normal.  - Aortic valve: Trileaflet; moderately thickened, mildly calcified  leaflets. Transvalvular velocity was minimally increased. There  was no stenosis. Valve area (VTI): 1.88 cm^2. Valve area (Vmax):  1.81 cm^2. Valve area (Vmean): 1.62 cm^2.  - Aortic root: The aortic root was normal in size.  - Mitral valve: Calcified annulus. Mildly thickened leaflets .  There was trivial regurgitation.  -  Left atrium: The atrium was normal in size.  - Right ventricle: Systolic function was normal.  - Right atrium: The atrium was normal in size.  - Tricuspid valve: There was mild regurgitation.  - Pulmonary arteries: Systolic pressure was within the normal  range.  - Inferior vena cava: The vessel was  normal in size.  - Pericardium, extracardiac: A trivial pericardial effusion was  identified.  Impressions:  - Normal biventricular size and function. Mildly elevated  transaortic gradients.    Past Medical History:  Diagnosis Date  . Anemia   . Anxiety   . Arthritis   . Chronic kidney disease    sees [facility]  . Diabetes mellitus    type II  . Dysphagia    last year or so   . Femur fracture ([place])   . GERD (gastroesophageal reflux disease)   . Heart murmur   . History of blood transfusion    tumor on tube and   . History of kidney stones    "stones in kidney"  . Hypertension     Past Surgical History:  Procedure Laterality Date  . ABDOMINAL HYSTERECTOMY     complete  . BALLOON DILATION N/A 03/22/2016   Procedure: BALLOON DILATION;  Surgeon: [name], MD;  Location: [facility];  Service: Endoscopy;  Laterality: N/A;  . colonscopy  2014  . ESOPHAGEAL MANOMETRY N/A 04/05/2016   Procedure: ESOPHAGEAL MANOMETRY (EM);  Surgeon: [name], MD;  Location: [facility];  Service: Endoscopy;  Laterality: N/A;  . ESOPHAGOGASTRODUODENOSCOPY (EGD) WITH PROPOFOL N/A 03/22/2016   Procedure: ESOPHAGOGASTRODUODENOSCOPY (EGD) WITH PROPOFOL;  Surgeon: [name], MD;  Location: [facility];  Service: Endoscopy;  Laterality: N/A;  . ESOPHAGOGASTRODUODENOSCOPY (EGD) WITH PROPOFOL N/A 06/01/2016   Procedure: ESOPHAGOGASTRODUODENOSCOPY (EGD) WITH PROPOFOL;  Surgeon: [name], MD;  Location: [facility];  Service: Endoscopy;  Laterality: N/A;  . FEMUR SURGERY Right 2011   rod placed  . TUBAL LIGATION      MEDICATIONS: No current facility-administered medications for this encounter.   Marland Kitchen ALPRAZolam (XANAX) 0.25 MG tablet  . amLODipine (NORVASC) 10 MG tablet  . atorvastatin (LIPITOR) 20 MG tablet  . Biotin 1000 MCG tablet  . cetirizine (ZYRTEC) 10 MG tablet  . famotidine (PEPCID) 20 MG tablet  . fluticasone (FLONASE) 50 MCG/ACT nasal spray  .  furosemide (LASIX) 40 MG tablet  . glipiZIDE (GLUCOTROL) 5 MG tablet  . Metoprolol Succinate 50 MG CS24  . Potassium Chloride ER 20 MEQ TBCR  . Turmeric 500 MG TABS  . epoetin alfa-epbx (RETACRIT) 96759 UNIT/ML injection  . epoetin alfa-epbx (RETACRIT) 16384 UNIT/ML injection     Myra Gianotti, PA-C Surgical Short Stay/Anesthesiology [facility] Phone [phone] [facility] Phone [phone] 10/02/2019 3:34 PM

## 2019-10-02 NOTE — Anesthesia Preprocedure Evaluation (Addendum)
Anesthesia Evaluation  Patient identified by MRN, date of birth, ID band Patient awake    Reviewed: Allergy & Precautions, H&P , NPO status , Patient's Chart, lab work & pertinent test results, reviewed documented beta blocker date and time   Airway Mallampati: II  TM Distance: >3 FB Neck ROM: Full    Dental no notable dental hx. (+) Partial Upper, Dental Advisory Given   Pulmonary neg pulmonary ROS,    Pulmonary exam normal breath sounds clear to auscultation       Cardiovascular Exercise Tolerance: Good METS: 5 - 7 Mets hypertension, Pt. on medications and Pt. on home beta blockers + Valvular Problems/Murmurs  Rhythm:Regular Rate:Normal + Systolic murmurs Echo 02/40/97: - Left ventricle: The cavity size was normal. Systolic function was  normal. The estimated ejection fraction was in the range of 50%  to 55%. Wall motion was normal; there were no regional wall  motion abnormalities. Left ventricular diastolic function  parameters were normal.  - Aortic valve: Trileaflet; moderately thickened, mildly calcified  leaflets. Transvalvular velocity was minimally increased. There  was no stenosis. Valve area (VTI): 1.88 cm^2. Valve area (Vmax):  1.81 cm^2. Valve area (Vmean): 1.62 cm^2.  - Aortic root: The aortic root was normal in size.  - Mitral valve: Calcified annulus. Mildly thickened leaflets .  There was trivial regurgitation.  - Left atrium: The atrium was normal in size.  - Right ventricle: Systolic function was normal.  - Right atrium: The atrium was normal in size.  - Tricuspid valve: There was mild regurgitation.  - Pulmonary arteries: Systolic pressure was within the normal  range.  - Inferior vena cava: The vessel was normal in size.  - Pericardium, extracardiac: A trivial pericardial effusion was  identified.  Impressions:  - Normal biventricular size and function. Mildly elevated  transaortic gradients.   -  Moderate pericardial effusion on 12/19/18 chest CT     Neuro/Psych Anxiety negative neurological ROS     GI/Hepatic Neg liver ROS, GERD  Medicated and Controlled,  Endo/Other  diabetes, Type 2, Oral Hypoglycemic Agents  Renal/GU ESRFRenal disease  negative genitourinary   Musculoskeletal  (+) Arthritis , Osteoarthritis,    Abdominal   Peds  Hematology  (+) Blood dyscrasia, anemia ,   Anesthesia Other Findings   Reproductive/Obstetrics negative OB ROS                           Anesthesia Physical Anesthesia Plan  ASA: III  Anesthesia Plan: MAC   Post-op Pain Management:    Induction: Intravenous  PONV Risk Score and Plan: 3 and Propofol infusion, Ondansetron and Treatment may vary due to age or medical condition  Airway Management Planned: Simple Face Mask  Additional Equipment:   Intra-op Plan:   Post-operative Plan:   Informed Consent: I have reviewed the patients History and Physical, chart, labs and discussed the procedure including the risks, benefits and alternatives for the proposed anesthesia with the patient or authorized representative who has indicated his/her understanding and acceptance.     Dental advisory given  Plan Discussed with: CRNA  Anesthesia Plan Comments: (See PAT note written 10/02/2019 by Myra Gianotti, PA-C. SAME DAY WORK-UP. Not yet on hemodialysis. PCP Dr. Felipa Eth periodically orders echocardiograms to evaluate for AS (known murmur), next echo scheduled later this month. Moderate pericardial effusion on 11/2018 CT.  )       Anesthesia Quick Evaluation

## 2019-10-02 NOTE — Progress Notes (Addendum)
Beth Malone denies chest pain or shortness of breath. Patient will be tested for Covid and will quarantine.  Beth Malone's PCP is Dr. Felipa Eth, he is sending Patient to have an ECHO- it is scheduled for 4/30/202.  Patient has a heart murmer and had a ECHO 6 years ago, DR. Stoneking is checking for any change.  I requested records from Dr. Carlyle Lipa office.  Beth Malone has type II diabetes, she reports last A1C was 6.8 and CBGs run 100- 150. I instructed patient to not take Glipizide tonight or in am. I instructed patient to check CBG after awaking and every 2 hours until arrival  to the hospital.  I Instructed patient if CBG is less 70 to drink  1/2 cup of a clear juice. Recheck CBG in 15 minutes to see if it is riising.   I am asking anesthesia PA- C to review.

## 2019-10-03 ENCOUNTER — Encounter (HOSPITAL_COMMUNITY): Payer: Self-pay | Admitting: Vascular Surgery

## 2019-10-03 ENCOUNTER — Encounter (HOSPITAL_COMMUNITY)
Admission: RE | Disposition: A | Discharge status: Home / Self Care | Payer: Self-pay | Source: Home / Self Care | Attending: Vascular Surgery

## 2019-10-03 ENCOUNTER — Other Ambulatory Visit: Payer: Self-pay

## 2019-10-03 ENCOUNTER — Ambulatory Visit (HOSPITAL_COMMUNITY): Payer: Medicare HMO | Admitting: Vascular Surgery

## 2019-10-03 ENCOUNTER — Ambulatory Visit (HOSPITAL_COMMUNITY)
Admission: RE | Admit: 2019-10-03 | Discharge: 2019-10-03 | Disposition: A | Discharge status: Home / Self Care | Payer: Medicare HMO | Attending: Vascular Surgery | Admitting: Vascular Surgery

## 2019-10-03 DIAGNOSIS — M1990 Unspecified osteoarthritis, unspecified site: Secondary | ICD-10-CM | POA: Insufficient documentation

## 2019-10-03 DIAGNOSIS — N185 Chronic kidney disease, stage 5: Secondary | ICD-10-CM | POA: Diagnosis not present

## 2019-10-03 DIAGNOSIS — I120 Hypertensive chronic kidney disease with stage 5 chronic kidney disease or end stage renal disease: Secondary | ICD-10-CM | POA: Insufficient documentation

## 2019-10-03 DIAGNOSIS — K219 Gastro-esophageal reflux disease without esophagitis: Secondary | ICD-10-CM | POA: Diagnosis not present

## 2019-10-03 DIAGNOSIS — E1122 Type 2 diabetes mellitus with diabetic chronic kidney disease: Secondary | ICD-10-CM | POA: Diagnosis not present

## 2019-10-03 DIAGNOSIS — N186 End stage renal disease: Secondary | ICD-10-CM

## 2019-10-03 DIAGNOSIS — D631 Anemia in chronic kidney disease: Secondary | ICD-10-CM | POA: Diagnosis not present

## 2019-10-03 HISTORY — DX: Personal history of other medical treatment: Z92.89

## 2019-10-03 HISTORY — DX: Gastro-esophageal reflux disease without esophagitis: K21.9

## 2019-10-03 HISTORY — DX: Personal history of urinary calculi: Z87.442

## 2019-10-03 HISTORY — DX: Cardiac murmur, unspecified: R01.1

## 2019-10-03 HISTORY — PX: AV FISTULA PLACEMENT: SHX1204

## 2019-10-03 LAB — POCT I-STAT, CHEM 8
BUN: 31 mg/dL — ABNORMAL HIGH (ref 8–23)
Calcium, Ion: 1.11 mmol/L — ABNORMAL LOW (ref 1.15–1.40)
Chloride: 102 mmol/L (ref 98–111)
Creatinine, Ser: 2.4 mg/dL — ABNORMAL HIGH (ref 0.44–1.00)
Glucose, Bld: 146 mg/dL — ABNORMAL HIGH (ref 70–99)
HCT: 35 % — ABNORMAL LOW (ref 36.0–46.0)
Hemoglobin: 11.9 g/dL — ABNORMAL LOW (ref 12.0–15.0)
Potassium: 3.4 mmol/L — ABNORMAL LOW (ref 3.5–5.1)
Sodium: 140 mmol/L (ref 135–145)
TCO2: 29 mmol/L (ref 22–32)

## 2019-10-03 LAB — SARS CORONAVIRUS 2 (TAT 6-24 HRS): SARS Coronavirus 2: NEGATIVE

## 2019-10-03 LAB — GLUCOSE, CAPILLARY
Glucose-Capillary: 157 mg/dL — ABNORMAL HIGH (ref 70–99)
Glucose-Capillary: 150 mg/dL — ABNORMAL HIGH (ref 70–99)

## 2019-10-03 SURGERY — ARTERIOVENOUS (AV) FISTULA CREATION
Anesthesia: Monitor Anesthesia Care | Site: Arm Upper | Laterality: Left

## 2019-10-03 MED ORDER — SODIUM CHLORIDE 0.9 % IV SOLN
INTRAVENOUS | Status: DC | PRN
  Administered 2019-10-03: 500 mL

## 2019-10-03 MED ORDER — LIDOCAINE-EPINEPHRINE (PF) 1 %-1:200000 IJ SOLN
INTRAMUSCULAR | Status: DC | PRN
  Administered 2019-10-03: 10 mL

## 2019-10-03 MED ORDER — PAPAVERINE HCL 30 MG/ML IJ SOLN
INTRAMUSCULAR | Status: AC
  Filled 2019-10-03: qty 2

## 2019-10-03 MED ORDER — LIDOCAINE-EPINEPHRINE (PF) 1 %-1:200000 IJ SOLN
INTRAMUSCULAR | Status: AC
  Filled 2019-10-03: qty 30

## 2019-10-03 MED ORDER — FENTANYL CITRATE (PF) 250 MCG/5ML IJ SOLN
INTRAMUSCULAR | Status: AC
  Filled 2019-10-03: qty 5

## 2019-10-03 MED ORDER — SODIUM CHLORIDE 0.9 % IV SOLN
INTRAVENOUS | Status: AC
  Filled 2019-10-03: qty 1.2

## 2019-10-03 MED ORDER — CHLORHEXIDINE GLUCONATE 4 % EX LIQD: 60.0000 mL | Freq: Once | CUTANEOUS | Status: DC

## 2019-10-03 MED ORDER — ACETAMINOPHEN 500 MG PO TABS
1000.0000 mg | ORAL_TABLET | Freq: Once | ORAL | Status: AC
  Administered 2019-10-03: 1000 mg via ORAL
  Filled 2019-10-03: qty 2

## 2019-10-03 MED ORDER — TRAMADOL HCL 50 MG PO TABS: 50.0000 mg | ORAL_TABLET | Freq: Four times a day (QID) | ORAL | 0 refills | Status: DC | PRN

## 2019-10-03 MED ORDER — 0.9 % SODIUM CHLORIDE (POUR BTL) OPTIME
TOPICAL | Status: DC | PRN
  Administered 2019-10-03: 1000 mL

## 2019-10-03 MED ORDER — PROPOFOL 500 MG/50ML IV EMUL
INTRAVENOUS | Status: DC | PRN
  Administered 2019-10-03: 25 ug/kg/min via INTRAVENOUS

## 2019-10-03 MED ORDER — ONDANSETRON HCL 4 MG/2ML IJ SOLN
INTRAMUSCULAR | Status: DC | PRN
  Administered 2019-10-03: 4 mg via INTRAVENOUS

## 2019-10-03 MED ORDER — SODIUM CHLORIDE 0.9 % IV SOLN
INTRAVENOUS | Status: DC
  Administered 2019-10-03: 10 mL/h via INTRAVENOUS

## 2019-10-03 MED ORDER — PROPOFOL 10 MG/ML IV BOLUS
INTRAVENOUS | Status: AC
  Filled 2019-10-03: qty 20

## 2019-10-03 MED ORDER — HEPARIN SODIUM (PORCINE) 1000 UNIT/ML IJ SOLN
INTRAMUSCULAR | Status: AC
  Filled 2019-10-03: qty 1

## 2019-10-03 MED ORDER — VANCOMYCIN HCL IN DEXTROSE 1-5 GM/200ML-% IV SOLN
1000.0000 mg | INTRAVENOUS | Status: AC
  Administered 2019-10-03: 08:00:00 1000 mg via INTRAVENOUS
  Filled 2019-10-03: qty 200

## 2019-10-03 MED ORDER — FENTANYL CITRATE (PF) 100 MCG/2ML IJ SOLN: 25.0000 ug | INTRAMUSCULAR | Status: DC | PRN

## 2019-10-03 MED ORDER — ONDANSETRON HCL 4 MG/2ML IJ SOLN
INTRAMUSCULAR | Status: AC
  Filled 2019-10-03: qty 2

## 2019-10-03 MED ORDER — FENTANYL CITRATE (PF) 250 MCG/5ML IJ SOLN
INTRAMUSCULAR | Status: DC | PRN
  Administered 2019-10-03 (×2): 50 ug via INTRAVENOUS

## 2019-10-03 SURGICAL SUPPLY — 34 items
ARMBAND PINK RESTRICT EXTREMIT (MISCELLANEOUS) ×2 IMPLANT
CANISTER SUCT 3000ML PPV (MISCELLANEOUS) ×2 IMPLANT
CLIP VESOCCLUDE MED 6/CT (CLIP) ×2 IMPLANT
CLIP VESOCCLUDE SM WIDE 6/CT (CLIP) ×2 IMPLANT
COVER PROBE W GEL 5X96 (DRAPES) ×1 IMPLANT
COVER WAND RF STERILE (DRAPES) ×2 IMPLANT
DERMABOND ADVANCED (GAUZE/BANDAGES/DRESSINGS) ×1
DERMABOND ADVANCED .7 DNX12 (GAUZE/BANDAGES/DRESSINGS) ×1 IMPLANT
ELECT REM PT RETURN 9FT ADLT (ELECTROSURGICAL) ×2
ELECTRODE REM PT RTRN 9FT ADLT (ELECTROSURGICAL) ×1 IMPLANT
GLOVE BIO SURGEON STRL SZ 6.5 (GLOVE) ×1 IMPLANT
GLOVE BIO SURGEON STRL SZ7.5 (GLOVE) ×2 IMPLANT
GLOVE BIOGEL PI IND STRL 6.5 (GLOVE) IMPLANT
GLOVE BIOGEL PI IND STRL 7.0 (GLOVE) IMPLANT
GLOVE BIOGEL PI INDICATOR 6.5 (GLOVE) ×1
GLOVE BIOGEL PI INDICATOR 7.0 (GLOVE) ×1
GLOVE ECLIPSE 6.5 STRL STRAW (GLOVE) ×1 IMPLANT
GOWN STRL REUS W/ TWL LRG LVL3 (GOWN DISPOSABLE) ×2 IMPLANT
GOWN STRL REUS W/ TWL XL LVL3 (GOWN DISPOSABLE) ×1 IMPLANT
GOWN STRL REUS W/TWL LRG LVL3 (GOWN DISPOSABLE) ×2
GOWN STRL REUS W/TWL XL LVL3 (GOWN DISPOSABLE) ×1
INSERT FOGARTY SM (MISCELLANEOUS) IMPLANT
KIT BASIN OR (CUSTOM PROCEDURE TRAY) ×2 IMPLANT
KIT TURNOVER KIT B (KITS) ×2 IMPLANT
NS IRRIG 1000ML POUR BTL (IV SOLUTION) ×2 IMPLANT
PACK CV ACCESS (CUSTOM PROCEDURE TRAY) ×2 IMPLANT
PAD ARMBOARD 7.5X6 YLW CONV (MISCELLANEOUS) ×4 IMPLANT
SUT MNCRL AB 4-0 PS2 18 (SUTURE) ×2 IMPLANT
SUT PROLENE 6 0 BV (SUTURE) ×3 IMPLANT
SUT VIC AB 3-0 SH 27 (SUTURE) ×1
SUT VIC AB 3-0 SH 27X BRD (SUTURE) ×1 IMPLANT
TOWEL GREEN STERILE (TOWEL DISPOSABLE) ×2 IMPLANT
UNDERPAD 30X30 (UNDERPADS AND DIAPERS) ×2 IMPLANT
WATER STERILE IRR 1000ML POUR (IV SOLUTION) ×2 IMPLANT

## 2019-10-03 NOTE — Discharge Instructions (Signed)
[facility]  Discharge Instructions  AV Fistula or Graft Surgery for Dialysis Access  Please refer to the following instructions for your post-procedure care. Your surgeon or physician assistant will discuss any changes with you.  Activity  You may drive the day following your surgery, if you are comfortable and no longer taking prescription pain medication. Resume full activity as the soreness in your incision resolves.  Bathing/Showering  You may shower after you go home. Keep your incision dry for 48 hours. Do not soak in a bathtub, hot tub, or swim until the incision heals completely. You may not shower if you have a hemodialysis catheter.  Incision Care  Clean your incision with mild soap and water after 48 hours. Pat the area dry with a clean towel. You do not need a bandage unless otherwise instructed. Do not apply any ointments or creams to your incision. You may have skin glue on your incision. Do not peel it off. It will come off on its own in about one week. Your arm may swell a bit after surgery. To reduce swelling use pillows to elevate your arm so it is above your heart. Your doctor will tell you if you need to lightly wrap your arm with an ACE bandage.  Diet  Resume your normal diet. There are not special food restrictions following this procedure. In order to heal from your surgery, it is CRITICAL to get adequate nutrition. Your body requires vitamins, minerals, and protein. Vegetables are the best source of vitamins and minerals. Vegetables also provide the perfect balance of protein. Processed food has little nutritional value, so try to avoid this.  Medications  Resume taking all of your medications. If your incision is causing pain, you may take over-the counter pain relievers such as acetaminophen (Tylenol). If you were prescribed a stronger pain medication, please be aware these medications can cause nausea and constipation. Prevent  nausea by taking the medication with a snack or meal. Avoid constipation by drinking plenty of fluids and eating foods with high amount of fiber, such as fruits, vegetables, and grains.  Do not take Tylenol if you are taking prescription pain medications.  Follow up Your surgeon may want to see you in the office following your access surgery. If so, this will be arranged at the time of your surgery.  Please call us immediately for any of the following conditions:  . Increased pain, redness, drainage (pus) from your incision site . Fever of 101 degrees or higher . Severe or worsening pain at your incision site . Hand pain or numbness. .  Reduce your risk of vascular disease:  . Stop smoking. If you would like help, call [facility] at 1-800-QUIT-NOW 3303617568) or [place] at 713-717-2488  . Manage your cholesterol . Maintain a desired weight . Control your diabetes . Keep your blood pressure down  Dialysis  It will take several weeks to several months for your new dialysis access to be ready for use. Your surgeon will determine when it is okay to use it. Your nephrologist will continue to direct your dialysis. You can continue to use your Permcath until your new access is ready for use.   10/03/2019 Beth Malone [id number] 04/17/1946  Surgeon(s): Beth Sandy, MD  Procedure(s): LEFT ARM ARTERIOVENOUS (AV) FISTULA CREATION   May stick graft immediately   May stick graft on designated area only:   X Do not stick left arm AV fistula for 12 weeks  If you have any questions, please call the office at [phone].

## 2019-10-03 NOTE — Transfer of Care (Signed)
Immediate Anesthesia Transfer of Care Note  Patient: Beth Malone  Procedure(s) Performed: LEFT ARM ARTERIOVENOUS (AV) FISTULA CREATION (Left Arm Upper)  Patient Location: PACU  Anesthesia Type:MAC  Level of Consciousness: drowsy and patient cooperative  Airway & Oxygen Therapy: Patient Spontanous Breathing and Patient connected to face mask oxygen  Post-op Assessment: Report given to RN and Post -op Vital signs reviewed and stable  Post vital signs: Reviewed and stable  Last Vitals:  Vitals Value Taken Time  BP    Temp    Pulse 73 10/03/19 0934  Resp 21 10/03/19 0934  SpO2 95 % 10/03/19 0934  Vitals shown include unvalidated device data.  Last Pain:  Vitals:   10/03/19 0722  TempSrc:   PainSc: 0-No pain      Patients Stated Pain Goal: 5 (87/19/59 7471)  Complications: No apparent anesthesia complications

## 2019-10-03 NOTE — H&P (Signed)
   History and Physical Update  The patient was interviewed and re-examined.  The patient's previous History and Physical has been reviewed and is unchanged from recent office visit. Plan for left arm avf today in OR.   Cheryll Keisler C. Donzetta Matters, MD [facility] Office: [phone] Pager: [phone]   10/03/2019, 8:01 AM

## 2019-10-03 NOTE — Anesthesia Postprocedure Evaluation (Signed)
Anesthesia Post Note  Patient: Beth Malone  Procedure(s) Performed: LEFT ARM ARTERIOVENOUS (AV) FISTULA CREATION (Left Arm Upper)     Patient location during evaluation: PACU Anesthesia Type: MAC Level of consciousness: awake and alert Pain management: pain level controlled Vital Signs Assessment: post-procedure vital signs reviewed and stable Respiratory status: spontaneous breathing, nonlabored ventilation and respiratory function stable Cardiovascular status: stable and blood pressure returned to baseline Postop Assessment: no apparent nausea or vomiting Anesthetic complications: no    Last Vitals:  Vitals:   10/03/19 0950 10/03/19 1005  BP: 135/77 133/65  Pulse: 72 71  Resp: 13 15  Temp:  (!) 36.4 C  SpO2: 92% 92%    Last Pain:  Vitals:   10/03/19 1005  TempSrc:   PainSc: 0-No pain                 Pink Maye,W. EDMOND

## 2019-10-03 NOTE — Anesthesia Procedure Notes (Signed)
Procedure Name: MAC Date/Time: 10/03/2019 8:36 AM Performed by: Kathryne Hitch, CRNA Pre-anesthesia Checklist: Patient identified, Suction available, Patient being monitored and Emergency Drugs available Patient Re-evaluated:Patient Re-evaluated prior to induction Oxygen Delivery Method: Simple face mask Preoxygenation: Pre-oxygenation with 100% oxygen Induction Type: IV induction Dental Injury: Teeth and Oropharynx as per pre-operative assessment

## 2019-10-03 NOTE — Op Note (Signed)
    Patient name: Beth Malone MRN: [id number] DOB: [birth year] Sex: female  10/03/2019 Pre-operative Diagnosis: [id number] Post-operative diagnosis:  Same Surgeon:  Erlene Quan C. Donzetta Matters, MD Assistant: Paulo Fruit, PA Procedure Performed:  Left arm brachial artery to cephalic vein avf creation  Indications: 74 year old female with history of chronic kidney disease stage V.  She is now indicated for permanent dialysis access.  She is right-hand dominant appears to have suitable vein on the left.  Findings: Intraoperative ultrasound she has both suitable basilic and cephalic vein above the antecubitum at the antecubital and the cephalic vein is larger.  But open exploration of the 2 are connected.  We ligated the basilic vein at the branch point.  Cephalic vein measured approximately 4 mm external diameter this was connected end-to-side to the suitable size brachial artery.  At completion there was a very strong thrill also palpable radial pulse at the wrist both confirmed with Doppler.   Procedure:  The patient was identified in the holding area and taken to the operating room where she is placed supine operative table and MAC anesthesia was induced.  She was sterilely prepped and draped in the left upper extremity usual fashion antibiotics were minister time was called.  Ultrasound was used to identify the veins.  The areas anesthetized 1% lidocaine.  Transverse incision was made.  We dissected out the vein for we ligated the basilic vein at the branch point.  Other branches were divided between ties.  The vein was marked for orientation.  We dissected through the deep fascia placed Vesseloops around the brachial artery.  The vein was tied off distally and transected flushed heparinized saline and clamped.  The artery was clipped distally proximally opened longitudinally flushed with heparinized saline distally.  We then spatulated the vein sewn end-to-side with 6-0 Prolene suture.  Prior completion without  flushing all direction.  Upon completion there was a very strong thrill.  We did free up some of the soft tissue around the vein.  There was a palpable radial artery pulse the wrist.  We confirmed both these with Doppler.  We irrigated the wound and obtained hemostasis and closed in layers with Vicryl and Monocryl.  She was awakened anesthesia having tolerated procedure without any complication but all counts were correct at completion.   EBL: 20cc  Avo Schlachter C. Donzetta Matters, MD [facility] Office: [phone] Pager: [phone]

## 2019-10-05 DIAGNOSIS — E538 Deficiency of other specified B group vitamins: Secondary | ICD-10-CM | POA: Diagnosis not present

## 2019-10-09 ENCOUNTER — Ambulatory Visit: Payer: Self-pay | Admitting: Physician Assistant

## 2019-10-09 ENCOUNTER — Other Ambulatory Visit: Payer: Self-pay

## 2019-10-09 ENCOUNTER — Telehealth: Payer: Self-pay

## 2019-10-09 VITALS — BP 117/49 | HR 96 | Temp 97.9°F | Resp 16 | Ht 65.0 in | Wt 182.0 lb

## 2019-10-09 DIAGNOSIS — N185 Chronic kidney disease, stage 5: Secondary | ICD-10-CM

## 2019-10-09 NOTE — Progress Notes (Signed)
POST OPERATIVE OFFICE NOTE    CC:  F/u for surgery  HPI:  This is a 74 y.o. female who is s/p Left arm BC AVF creation on 10/03/2019 by Dr. Donzetta Matters.   She comes in today with complaints of pain in her left wrist down to her fingers.  She states she has had some pain, but it has worsened over the past couple of days.  She states that when she was washing dishes this morning, she could not grip the dish.  She states that her left hand is weak.    The pt does have evidence of steal sx. The pt is not on dialysis.  She tells me that she just got her lab work back and was told that it was good.    Allergies  Allergen Reactions  . Augmentin [Amoxicillin-Pot Clavulanate] Nausea And Vomiting  . Codeine Itching and Other (See Comments)    Don't sleep    Current Outpatient Medications  Medication Sig Dispense Refill  . ALPRAZolam (XANAX) 0.25 MG tablet Take 0.25 mg by mouth daily as needed for anxiety. anxiety     . amLODipine (NORVASC) 10 MG tablet Take 10 mg by mouth daily.    Marland Kitchen atorvastatin (LIPITOR) 20 MG tablet Take 20 mg by mouth at bedtime.     . Biotin 1000 MCG tablet Take 1,000 mcg by mouth daily.    . cetirizine (ZYRTEC) 10 MG tablet Take 10 mg by mouth daily as needed for allergies.     Marland Kitchen epoetin alfa-epbx (RETACRIT) 74128 UNIT/ML injection 10,000 Units every 30 (thirty) days.     Marland Kitchen epoetin alfa-epbx (RETACRIT) 78676 UNIT/ML injection Inject 15,000 Units into the skin.     . famotidine (PEPCID) 20 MG tablet Take 40 mg by mouth 2 (two) times daily.     . fluticasone (FLONASE) 50 MCG/ACT nasal spray Place 1 spray into both nostrils daily as needed for allergies or rhinitis.    . furosemide (LASIX) 40 MG tablet Take 40 mg by mouth 2 (two) times daily.     Marland Kitchen glipiZIDE (GLUCOTROL) 5 MG tablet Take 5 mg by mouth 2 (two) times daily before a meal.     . Metoprolol Succinate 50 MG CS24 Take 50 mg by mouth daily.     . Potassium Chloride ER 20 MEQ TBCR Take 40 mEq by mouth daily.     . traMADol  (ULTRAM) 50 MG tablet Take 1 tablet (50 mg total) by mouth every 6 (six) hours as needed. 6 tablet 0  . Turmeric 500 MG TABS Take 500 mg by mouth 2 (two) times daily.      No current facility-administered medications for this visit.     ROS:  See HPI  Physical Exam:  Today's Vitals   10/09/19 1517  BP: (!) 117/49  Pulse: 96  Resp: 16  Temp: 97.9 F (36.6 C)  TempSrc: Temporal  SpO2: 100%  Weight: 182 lb (82.6 kg)  Height: 5\' 5"  (1.651 m)   Body mass index is 30.29 kg/m.   Incision:  Well healed with some ecchymosis  Extremities:  There is not a palpable left radial pulse.  Her fingertips on the left are cooler than the right.  Minimal grip with left hand.   There is significant augmentation of the left radial/ulnar and palmar arch with compression of the fistula.   Dialysis Duplex  None today   Assessment/Plan:  This is a 74 y.o. female who is s/p: Left arm BC AVF creation  on 10/03/2019 by Dr. Donzetta Matters who presents today with pain in the left wrist down to her fingers that is progressively getting worse.   -the pt does have evidence of steal and and has decreased motor in the left hand and her radial/ulnar and palmar arch augment with compression of the fistula.  Dr. Donnetta Hutching also examined the pt and feel that she has significant steal and will need to undergo ligation of the left arm fistula tomorrow.  We will ligate tomorrow and have her follow up in a couple of weeks to evaluate for new access.   She is not yet on dialysis.  He also explained to her that she would be at increased risk for steal in the right arm as well.    Leontine Locket, [facility] [phone]  Clinic MD:  Pt seen and examined with Dr. Donnetta Hutching

## 2019-10-09 NOTE — Telephone Encounter (Signed)
Pt called triage line with c/o swelling, soreness, and pain of L wrist to hand for the last week since surgery. It has not improved and she states she is unable to grip a pen and making a fist makes pain worse. Scheduled her to see PA today. Pt is aware.

## 2019-10-10 ENCOUNTER — Ambulatory Visit (HOSPITAL_COMMUNITY)
Admission: RE | Admit: 2019-10-10 | Discharge: 2019-10-10 | Disposition: A | Discharge status: Home / Self Care | Payer: Medicare HMO | Attending: Vascular Surgery | Admitting: Vascular Surgery

## 2019-10-10 ENCOUNTER — Encounter (HOSPITAL_COMMUNITY)
Admission: RE | Disposition: A | Discharge status: Home / Self Care | Payer: Self-pay | Source: Home / Self Care | Attending: Vascular Surgery

## 2019-10-10 ENCOUNTER — Ambulatory Visit (HOSPITAL_COMMUNITY): Payer: Medicare HMO | Admitting: Anesthesiology

## 2019-10-10 ENCOUNTER — Inpatient Hospital Stay (HOSPITAL_COMMUNITY): Admission: RE | Admit: 2019-10-10 | Payer: Medicare HMO | Source: Ambulatory Visit

## 2019-10-10 ENCOUNTER — Encounter (HOSPITAL_COMMUNITY): Payer: Self-pay | Admitting: Vascular Surgery

## 2019-10-10 DIAGNOSIS — I120 Hypertensive chronic kidney disease with stage 5 chronic kidney disease or end stage renal disease: Secondary | ICD-10-CM | POA: Diagnosis not present

## 2019-10-10 DIAGNOSIS — F419 Anxiety disorder, unspecified: Secondary | ICD-10-CM | POA: Diagnosis not present

## 2019-10-10 DIAGNOSIS — N189 Chronic kidney disease, unspecified: Secondary | ICD-10-CM | POA: Diagnosis not present

## 2019-10-10 DIAGNOSIS — E1122 Type 2 diabetes mellitus with diabetic chronic kidney disease: Secondary | ICD-10-CM | POA: Diagnosis not present

## 2019-10-10 DIAGNOSIS — D631 Anemia in chronic kidney disease: Secondary | ICD-10-CM | POA: Diagnosis not present

## 2019-10-10 DIAGNOSIS — Y832 Surgical operation with anastomosis, bypass or graft as the cause of abnormal reaction of the patient, or of later complication, without mention of misadventure at the time of the procedure: Secondary | ICD-10-CM | POA: Diagnosis not present

## 2019-10-10 DIAGNOSIS — Z79899 Other long term (current) drug therapy: Secondary | ICD-10-CM | POA: Insufficient documentation

## 2019-10-10 DIAGNOSIS — I129 Hypertensive chronic kidney disease with stage 1 through stage 4 chronic kidney disease, or unspecified chronic kidney disease: Secondary | ICD-10-CM | POA: Diagnosis not present

## 2019-10-10 DIAGNOSIS — T82510A Breakdown (mechanical) of surgically created arteriovenous fistula, initial encounter: Secondary | ICD-10-CM | POA: Insufficient documentation

## 2019-10-10 DIAGNOSIS — Z20822 Contact with and (suspected) exposure to covid-19: Secondary | ICD-10-CM | POA: Diagnosis not present

## 2019-10-10 DIAGNOSIS — T82898A Other specified complication of vascular prosthetic devices, implants and grafts, initial encounter: Secondary | ICD-10-CM | POA: Diagnosis not present

## 2019-10-10 DIAGNOSIS — N185 Chronic kidney disease, stage 5: Secondary | ICD-10-CM | POA: Insufficient documentation

## 2019-10-10 DIAGNOSIS — Z7984 Long term (current) use of oral hypoglycemic drugs: Secondary | ICD-10-CM | POA: Diagnosis not present

## 2019-10-10 DIAGNOSIS — R69 Illness, unspecified: Secondary | ICD-10-CM | POA: Diagnosis not present

## 2019-10-10 DIAGNOSIS — D649 Anemia, unspecified: Secondary | ICD-10-CM | POA: Diagnosis not present

## 2019-10-10 HISTORY — PX: LIGATION OF ARTERIOVENOUS  FISTULA: SHX5948

## 2019-10-10 LAB — GLUCOSE, CAPILLARY
Glucose-Capillary: 125 mg/dL — ABNORMAL HIGH (ref 70–99)
Glucose-Capillary: 107 mg/dL — ABNORMAL HIGH (ref 70–99)
Glucose-Capillary: 110 mg/dL — ABNORMAL HIGH (ref 70–99)

## 2019-10-10 LAB — POCT I-STAT, CHEM 8
BUN: 30 mg/dL — ABNORMAL HIGH (ref 8–23)
Calcium, Ion: 1.25 mmol/L (ref 1.15–1.40)
Chloride: 106 mmol/L (ref 98–111)
Creatinine, Ser: 2.5 mg/dL — ABNORMAL HIGH (ref 0.44–1.00)
Glucose, Bld: 129 mg/dL — ABNORMAL HIGH (ref 70–99)
HCT: 30 % — ABNORMAL LOW (ref 36.0–46.0)
Hemoglobin: 10.2 g/dL — ABNORMAL LOW (ref 12.0–15.0)
Potassium: 3.5 mmol/L (ref 3.5–5.1)
Sodium: 140 mmol/L (ref 135–145)
TCO2: 27 mmol/L (ref 22–32)

## 2019-10-10 LAB — SARS CORONAVIRUS 2 BY RT PCR (HOSPITAL ORDER, PERFORMED IN ~~LOC~~ HOSPITAL LAB): SARS Coronavirus 2: NEGATIVE

## 2019-10-10 SURGERY — LIGATION OF ARTERIOVENOUS  FISTULA
Anesthesia: Monitor Anesthesia Care | Site: Arm Upper | Laterality: Left

## 2019-10-10 MED ORDER — SODIUM CHLORIDE 0.9 % IV SOLN
INTRAVENOUS | Status: AC
  Filled 2019-10-10: qty 1.2

## 2019-10-10 MED ORDER — FENTANYL CITRATE (PF) 250 MCG/5ML IJ SOLN
INTRAMUSCULAR | Status: AC
  Filled 2019-10-10: qty 5

## 2019-10-10 MED ORDER — ONDANSETRON HCL 4 MG/2ML IJ SOLN: 4.0000 mg | Freq: Four times a day (QID) | INTRAMUSCULAR | Status: DC | PRN

## 2019-10-10 MED ORDER — TRAMADOL HCL 50 MG PO TABS: 50.0000 mg | ORAL_TABLET | Freq: Four times a day (QID) | ORAL | 0 refills | Status: DC | PRN

## 2019-10-10 MED ORDER — 0.9 % SODIUM CHLORIDE (POUR BTL) OPTIME
TOPICAL | Status: DC | PRN
  Administered 2019-10-10: 1000 mL

## 2019-10-10 MED ORDER — PROPOFOL 500 MG/50ML IV EMUL
INTRAVENOUS | Status: DC | PRN
  Administered 2019-10-10: 50 ug/kg/min via INTRAVENOUS

## 2019-10-10 MED ORDER — ONDANSETRON HCL 4 MG/2ML IJ SOLN
INTRAMUSCULAR | Status: DC | PRN
  Administered 2019-10-10: 4 mg via INTRAVENOUS

## 2019-10-10 MED ORDER — FENTANYL CITRATE (PF) 100 MCG/2ML IJ SOLN: 25.0000 ug | INTRAMUSCULAR | Status: DC | PRN

## 2019-10-10 MED ORDER — SODIUM CHLORIDE 0.9 % IV SOLN: INTRAVENOUS | Status: DC

## 2019-10-10 MED ORDER — VANCOMYCIN HCL IN DEXTROSE 1-5 GM/200ML-% IV SOLN
INTRAVENOUS | Status: AC
  Filled 2019-10-10: qty 200

## 2019-10-10 MED ORDER — OXYCODONE HCL 5 MG PO TABS: 5.0000 mg | ORAL_TABLET | Freq: Once | ORAL | Status: DC | PRN

## 2019-10-10 MED ORDER — VANCOMYCIN HCL IN DEXTROSE 1-5 GM/200ML-% IV SOLN
1000.0000 mg | INTRAVENOUS | Status: AC
  Administered 2019-10-10: 1000 mg via INTRAVENOUS

## 2019-10-10 MED ORDER — LIDOCAINE-EPINEPHRINE 0.5 %-1:200000 IJ SOLN
INTRAMUSCULAR | Status: AC
  Filled 2019-10-10: qty 1

## 2019-10-10 MED ORDER — CHLORHEXIDINE GLUCONATE 4 % EX LIQD: 60.0000 mL | Freq: Once | CUTANEOUS | Status: DC

## 2019-10-10 MED ORDER — ONDANSETRON HCL 4 MG/2ML IJ SOLN
INTRAMUSCULAR | Status: AC
  Filled 2019-10-10: qty 2

## 2019-10-10 MED ORDER — OXYCODONE HCL 5 MG/5ML PO SOLN: 5.0000 mg | Freq: Once | ORAL | Status: DC | PRN

## 2019-10-10 MED ORDER — LIDOCAINE-EPINEPHRINE 0.5 %-1:200000 IJ SOLN
INTRAMUSCULAR | Status: DC | PRN
  Administered 2019-10-10: 50 mL

## 2019-10-10 MED ORDER — FENTANYL CITRATE (PF) 100 MCG/2ML IJ SOLN
INTRAMUSCULAR | Status: DC | PRN
  Administered 2019-10-10: 25 ug via INTRAVENOUS

## 2019-10-10 MED ORDER — VANCOMYCIN HCL 1000 MG IV SOLR
INTRAVENOUS | Status: DC | PRN
  Administered 2019-10-10: 1000 mg via INTRAVENOUS

## 2019-10-10 MED ORDER — LIDOCAINE 2% (20 MG/ML) 5 ML SYRINGE
INTRAMUSCULAR | Status: DC | PRN
  Administered 2019-10-10: 60 mg via INTRAVENOUS

## 2019-10-10 SURGICAL SUPPLY — 27 items
CANISTER SUCT 3000ML PPV (MISCELLANEOUS) ×2 IMPLANT
CLIP LIGATING EXTRA MED SLVR (CLIP) ×2 IMPLANT
CLIP LIGATING EXTRA SM BLUE (MISCELLANEOUS) ×2 IMPLANT
COVER WAND RF STERILE (DRAPES) IMPLANT
DECANTER SPIKE VIAL GLASS SM (MISCELLANEOUS) ×2 IMPLANT
DERMABOND ADVANCED (GAUZE/BANDAGES/DRESSINGS) ×1
DERMABOND ADVANCED .7 DNX12 (GAUZE/BANDAGES/DRESSINGS) ×1 IMPLANT
ELECT REM PT RETURN 9FT ADLT (ELECTROSURGICAL) ×2
ELECTRODE REM PT RTRN 9FT ADLT (ELECTROSURGICAL) ×1 IMPLANT
GLOVE SS BIOGEL STRL SZ 7.5 (GLOVE) ×1 IMPLANT
GLOVE SUPERSENSE BIOGEL SZ 7.5 (GLOVE) ×1
GOWN STRL REUS W/ TWL LRG LVL3 (GOWN DISPOSABLE) ×3 IMPLANT
GOWN STRL REUS W/TWL LRG LVL3 (GOWN DISPOSABLE) ×3
KIT BASIN OR (CUSTOM PROCEDURE TRAY) ×2 IMPLANT
KIT TURNOVER KIT B (KITS) ×2 IMPLANT
NS IRRIG 1000ML POUR BTL (IV SOLUTION) ×2 IMPLANT
PACK CV ACCESS (CUSTOM PROCEDURE TRAY) ×2 IMPLANT
PAD ARMBOARD 7.5X6 YLW CONV (MISCELLANEOUS) ×4 IMPLANT
SUT ETHILON 3 0 PS 1 (SUTURE) IMPLANT
SUT MNCRL AB 4-0 PS2 18 (SUTURE) ×2 IMPLANT
SUT PROLENE 6 0 CC (SUTURE) IMPLANT
SUT SILK 0 TIES 10X30 (SUTURE) ×2 IMPLANT
SUT VIC AB 3-0 SH 27 (SUTURE) ×1
SUT VIC AB 3-0 SH 27X BRD (SUTURE) ×1 IMPLANT
TOWEL GREEN STERILE (TOWEL DISPOSABLE) ×2 IMPLANT
UNDERPAD 30X36 HEAVY ABSORB (UNDERPADS AND DIAPERS) ×2 IMPLANT
WATER STERILE IRR 1000ML POUR (IV SOLUTION) ×2 IMPLANT

## 2019-10-10 NOTE — Anesthesia Preprocedure Evaluation (Signed)
Anesthesia Evaluation  Patient identified by MRN, date of birth, ID band Patient awake    Reviewed: Allergy & Precautions, H&P , NPO status , Patient's Chart, lab work & pertinent test results  Airway Mallampati: II   Neck ROM: full    Dental   Pulmonary neg pulmonary ROS,    breath sounds clear to auscultation       Cardiovascular hypertension,  Rhythm:regular Rate:Normal     Neuro/Psych PSYCHIATRIC DISORDERS Anxiety    GI/Hepatic GERD  ,  Endo/Other  diabetes, Type 2  Renal/GU Renal InsufficiencyRenal disease     Musculoskeletal  (+) Arthritis ,   Abdominal   Peds  Hematology   Anesthesia Other Findings   Reproductive/Obstetrics                             Anesthesia Physical Anesthesia Plan  ASA: III  Anesthesia Plan: MAC   Post-op Pain Management:    Induction: Intravenous  PONV Risk Score and Plan: 2 and Propofol infusion, Treatment may vary due to age or medical condition and Ondansetron  Airway Management Planned: Simple Face Mask  Additional Equipment:   Intra-op Plan:   Post-operative Plan:   Informed Consent: I have reviewed the patients History and Physical, chart, labs and discussed the procedure including the risks, benefits and alternatives for the proposed anesthesia with the patient or authorized representative who has indicated his/her understanding and acceptance.       Plan Discussed with: CRNA, Anesthesiologist and Surgeon  Anesthesia Plan Comments:         Anesthesia Quick Evaluation

## 2019-10-10 NOTE — Progress Notes (Signed)
Patient instructed to arrive at 1130 and will be tested for Covid DOS.  Patient states her blood sugar is in the 190's and she has taken her metoprolol and amlodipine this AM with a small sip of water and she has been NPO.  Patient instructed to check her blood sugar every 2 hours and if blood sugar less than 70, to drink 1/2 cup of apple or cranberry juice.  Patient verbalized understanding.

## 2019-10-10 NOTE — Transfer of Care (Signed)
Immediate Anesthesia Transfer of Care Note  Patient: Beth Malone  Procedure(s) Performed: LIGATION OF LEFT ARM FISTULA (Left Arm Upper)  Patient Location: PACU  Anesthesia Type:MAC  Level of Consciousness: awake, alert , oriented and patient cooperative  Airway & Oxygen Therapy: Patient Spontanous Breathing  Post-op Assessment: Report given to RN and Post -op Vital signs reviewed and stable  Post vital signs: Reviewed and stable  Last Vitals:  Vitals Value Taken Time  BP 120/73 10/10/19 1601  Temp    Pulse 79 10/10/19 1601  Resp 21 10/10/19 1601  SpO2 98 % 10/10/19 1601  Vitals shown include unvalidated device data.  Last Pain:  Vitals:   10/10/19 1233  PainSc: 10-Worst pain ever      Patients Stated Pain Goal: 3 (02/98/47 3085)  Complications: No apparent anesthesia complications

## 2019-10-10 NOTE — Discharge Instructions (Signed)
Monitored Anesthesia Care Anesthesia is a term that refers to techniques, procedures, and medicines that help a person stay safe and comfortable during a medical procedure. Monitored anesthesia care, or sedation, is one type of anesthesia. Your anesthesia specialist may recommend sedation if you will be having a procedure that does not require you to be unconscious, such as:  Cataract surgery.  A dental procedure.  A biopsy.  A colonoscopy. During the procedure, you may receive a medicine to help you relax (sedative). There are three levels of sedation:  Mild sedation. At this level, you may feel awake and relaxed. You will be able to follow directions.  Moderate sedation. At this level, you will be sleepy. You may not remember the procedure.  Deep sedation. At this level, you will be asleep. You will not remember the procedure. The more medicine you are given, the deeper your level of sedation will be. Depending on how you respond to the procedure, the anesthesia specialist may change your level of sedation or the type of anesthesia to fit your needs. An anesthesia specialist will monitor you closely during the procedure. Let your health care provider know about:  Any allergies you have.  All medicines you are taking, including vitamins, herbs, eye drops, creams, and over-the-counter medicines.  Any use of steroids (by mouth or as a cream).  Any problems you or family members have had with sedatives and anesthetic medicines.  Any blood disorders you have.  Any surgeries you have had.  Any medical conditions you have, such as sleep apnea.  Whether you are pregnant or may be pregnant.  Any use of cigarettes, alcohol, or street drugs. What are the risks? Generally, this is a safe procedure. However, problems may occur, including:  Getting too much medicine (oversedation).  Nausea.  Allergic reaction to medicines.  Trouble breathing. If this happens, a breathing tube may be  used to help with breathing. It will be removed when you are awake and breathing on your own.  Heart trouble.  Lung trouble. Before the procedure Staying hydrated Follow instructions from your health care provider about hydration, which may include:  Up to 2 hours before the procedure - you may continue to drink clear liquids, such as water, clear fruit juice, black coffee, and plain tea. Eating and drinking restrictions Follow instructions from your health care provider about eating and drinking, which may include:  8 hours before the procedure - stop eating heavy meals or foods such as meat, fried foods, or fatty foods.  6 hours before the procedure - stop eating light meals or foods, such as toast or cereal.  6 hours before the procedure - stop drinking milk or drinks that contain milk.  2 hours before the procedure - stop drinking clear liquids. Medicines Ask your health care provider about:  Changing or stopping your regular medicines. This is especially important if you are taking diabetes medicines or blood thinners.  Taking medicines such as aspirin and ibuprofen. These medicines can thin your blood. Do not take these medicines before your procedure if your health care provider instructs you not to. Tests and exams  You will have a physical exam.  You may have blood tests done to show: ? How well your kidneys and liver are working. ? How well your blood can clot. General instructions  Plan to have someone take you home from the hospital or clinic.  If you will be going home right after the procedure, plan to have someone with you  for 24 hours.  What happens during the procedure?  Your blood pressure, heart rate, breathing, level of pain and overall condition will be monitored.  An IV tube will be inserted into one of your veins.  Your anesthesia specialist will give you medicines as needed to keep you comfortable during the procedure. This may mean changing the  level of sedation.  The procedure will be performed. After the procedure  Your blood pressure, heart rate, breathing rate, and blood oxygen level will be monitored until the medicines you were given have worn off.  Do not drive for 24 hours if you received a sedative.  You may: ? Feel sleepy, clumsy, or nauseous. ? Feel forgetful about what happened after the procedure. ? Have a sore throat if you had a breathing tube during the procedure. ? Vomit. This information is not intended to replace advice given to you by your health care provider. Make sure you discuss any questions you have with your health care provider. Document Revised: 04/29/2017 Document Reviewed: 09/07/2015 Elsevier Patient Education  [employer].

## 2019-10-10 NOTE — Op Note (Signed)
    OPERATIVE REPORT  DATE OF SURGERY: 10/10/2019  PATIENT: Beth Malone, 74 y.o. 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  PHYSICIAN ASSISTANT: Liana Crocker, PA-C  ANESTHESIA: Local with sedation  EBL: per anesthesia record  Total I/O In: 250 [IV Piggyback:250] Out: -   BLOOD ADMINISTERED: none  DRAINS: none  SPECIMEN: none  COUNTS CORRECT:  YES  PATIENT DISPOSITION:  PACU - hemodynamically stable  PROCEDURE DETAILS: The patient was taken operating placed to position where the area of the left arm prepped draped in usual fashion.  The left antecubital incision was opened by removing the sutures after instilling local anesthesia.  The cephalic vein was doubly ligated with 2-0 silk ties.  The wound was irrigated with saline.  Hemostasis to electrocautery.  The wounds were closed with 3-0 Vicryl subcutaneous tissue and 4 subcuticular Vicryl in the subcuticular.  Transfer to the recovery in stable condition   [name], M.D., [facility] 10/10/2019 3:48 PM

## 2019-10-12 ENCOUNTER — Encounter: Payer: Self-pay | Admitting: Anesthesiology

## 2019-10-12 NOTE — Anesthesia Postprocedure Evaluation (Signed)
Anesthesia Post Note  Patient: Beth Malone  Procedure(s) Performed: LIGATION OF LEFT ARM FISTULA (Left Arm Upper)     Patient location during evaluation: PACU Anesthesia Type: MAC Level of consciousness: awake and alert Pain management: pain level controlled Vital Signs Assessment: post-procedure vital signs reviewed and stable Respiratory status: spontaneous breathing, nonlabored ventilation, respiratory function stable and patient connected to nasal cannula oxygen Cardiovascular status: stable and blood pressure returned to baseline Postop Assessment: no apparent nausea or vomiting Anesthetic complications: no    Last Vitals:  Vitals:   10/10/19 1645 10/10/19 1700  BP: 132/72 126/68  Pulse: 74 73  Resp: 19 15  Temp:  (!) 36.2 C  SpO2: 96% 93%    Last Pain:  Vitals:   10/10/19 1700  PainSc: 5                  HODIERNE,ADAM S

## 2019-10-18 ENCOUNTER — Encounter (HOSPITAL_COMMUNITY)
Admission: RE | Admit: 2019-10-18 | Discharge: 2019-10-18 | Disposition: A | Discharge status: Home / Self Care | Payer: Medicare HMO | Source: Ambulatory Visit | Attending: Vascular Surgery | Admitting: Vascular Surgery

## 2019-10-18 ENCOUNTER — Encounter (HOSPITAL_COMMUNITY): Payer: Self-pay

## 2019-10-18 ENCOUNTER — Other Ambulatory Visit: Payer: Self-pay

## 2019-10-18 DIAGNOSIS — D631 Anemia in chronic kidney disease: Secondary | ICD-10-CM | POA: Diagnosis not present

## 2019-10-18 DIAGNOSIS — N185 Chronic kidney disease, stage 5: Secondary | ICD-10-CM | POA: Diagnosis not present

## 2019-10-18 LAB — IRON AND TIBC
Iron: 72 ug/dL (ref 28–170)
Saturation Ratios: 32 % — ABNORMAL HIGH (ref 10.4–31.8)
TIBC: 228 ug/dL — ABNORMAL LOW (ref 250–450)
UIBC: 156 ug/dL

## 2019-10-18 LAB — FERRITIN: Ferritin: 328 ng/mL — ABNORMAL HIGH (ref 11–307)

## 2019-10-18 LAB — POCT HEMOGLOBIN-HEMACUE: Hemoglobin: 10.4 g/dL — ABNORMAL LOW (ref 12.0–15.0)

## 2019-10-18 MED ORDER — EPOETIN ALFA-EPBX 10000 UNIT/ML IJ SOLN
10000.0000 [IU] | Freq: Once | INTRAMUSCULAR | Status: AC
  Administered 2019-10-18: 10000 [IU] via SUBCUTANEOUS
  Filled 2019-10-18: qty 1

## 2019-10-19 ENCOUNTER — Ambulatory Visit (HOSPITAL_COMMUNITY): Payer: Medicare HMO | Attending: Cardiology

## 2019-10-19 DIAGNOSIS — R011 Cardiac murmur, unspecified: Secondary | ICD-10-CM | POA: Diagnosis not present

## 2019-10-22 DIAGNOSIS — R69 Illness, unspecified: Secondary | ICD-10-CM | POA: Diagnosis not present

## 2019-10-25 DIAGNOSIS — E7800 Pure hypercholesterolemia, unspecified: Secondary | ICD-10-CM | POA: Diagnosis not present

## 2019-10-25 DIAGNOSIS — E1121 Type 2 diabetes mellitus with diabetic nephropathy: Secondary | ICD-10-CM | POA: Diagnosis not present

## 2019-10-25 DIAGNOSIS — I129 Hypertensive chronic kidney disease with stage 1 through stage 4 chronic kidney disease, or unspecified chronic kidney disease: Secondary | ICD-10-CM | POA: Diagnosis not present

## 2019-10-25 DIAGNOSIS — N184 Chronic kidney disease, stage 4 (severe): Secondary | ICD-10-CM | POA: Diagnosis not present

## 2019-10-25 DIAGNOSIS — E119 Type 2 diabetes mellitus without complications: Secondary | ICD-10-CM | POA: Diagnosis not present

## 2019-10-26 ENCOUNTER — Encounter: Payer: Self-pay | Admitting: Vascular Surgery

## 2019-10-26 ENCOUNTER — Other Ambulatory Visit: Payer: Self-pay

## 2019-10-26 ENCOUNTER — Ambulatory Visit (INDEPENDENT_AMBULATORY_CARE_PROVIDER_SITE_OTHER): Payer: Self-pay | Admitting: Vascular Surgery

## 2019-10-26 VITALS — BP 130/84 | HR 79 | Temp 97.7°F | Resp 20 | Ht 65.0 in | Wt 179.0 lb

## 2019-10-26 DIAGNOSIS — N185 Chronic kidney disease, stage 5: Secondary | ICD-10-CM

## 2019-10-26 NOTE — Progress Notes (Signed)
    Subjective:     Patient ID: Beth Malone, female   DOB: [birth year], 74 y.o.   MRN: [id number]  HPI 74 year old female recent underwent fistula placement and subsequently had to have ligation for steal.  She states that she is having some difficulty using her thumb and index finger on the left but mostly this is getting better she does have the ability to grip now.  She also has some pain in the forearm also is improving.  Patient is not wanting any dialysis access that is not absolutely necessary at this time.   Review of Systems As above    Objective:   Physical Exam Vitals:   10/26/19 1056  BP: 130/84  Pulse: 79  Resp: 20  Temp: 97.7 F (36.5 C)  SpO2: 96%  Awake alert oriented Lab respirations Left antecubital incision healing well Palpable left radial pulse Grip strength is 3 out of 5 on the left     Assessment/plan     74 year old female status post fistula creation and ligation for steal versus IMN.  She does have improvement in her symptoms her grip strength is actually quite good today.  I recommended continued use of the hand hopefully this will fully resolve as the edema resolves in the forearm and hand.  If this is not better in 1 month we will send her for occupational therapy.  She will otherwise follow-up on a as needed basis if she is unwilling to undergo any further access creation at this time which I am in full agreement with.        Romero Letizia C. Donzetta Matters, MD [facility] [place] Office: [phone] Pager: [phone]

## 2019-11-01 ENCOUNTER — Other Ambulatory Visit (HOSPITAL_COMMUNITY): Payer: Medicare HMO

## 2019-11-01 ENCOUNTER — Ambulatory Visit (HOSPITAL_COMMUNITY): Payer: Medicare HMO

## 2019-11-07 DIAGNOSIS — E538 Deficiency of other specified B group vitamins: Secondary | ICD-10-CM | POA: Diagnosis not present

## 2019-11-09 ENCOUNTER — Other Ambulatory Visit: Payer: Self-pay | Admitting: *Deleted

## 2019-11-09 ENCOUNTER — Ambulatory Visit (HOSPITAL_COMMUNITY): Payer: Medicare HMO

## 2019-11-09 DIAGNOSIS — N186 End stage renal disease: Secondary | ICD-10-CM

## 2019-11-12 DIAGNOSIS — R69 Illness, unspecified: Secondary | ICD-10-CM | POA: Diagnosis not present

## 2019-11-14 ENCOUNTER — Ambulatory Visit (HOSPITAL_COMMUNITY): Payer: Medicare HMO

## 2019-11-14 ENCOUNTER — Encounter (HOSPITAL_COMMUNITY): Payer: Medicare HMO

## 2019-11-14 DIAGNOSIS — E876 Hypokalemia: Secondary | ICD-10-CM | POA: Diagnosis not present

## 2019-11-14 DIAGNOSIS — D631 Anemia in chronic kidney disease: Secondary | ICD-10-CM | POA: Diagnosis not present

## 2019-11-14 DIAGNOSIS — N119 Chronic tubulo-interstitial nephritis, unspecified: Secondary | ICD-10-CM | POA: Diagnosis not present

## 2019-11-14 DIAGNOSIS — I120 Hypertensive chronic kidney disease with stage 5 chronic kidney disease or end stage renal disease: Secondary | ICD-10-CM | POA: Diagnosis not present

## 2019-11-14 DIAGNOSIS — N185 Chronic kidney disease, stage 5: Secondary | ICD-10-CM | POA: Diagnosis not present

## 2019-11-16 ENCOUNTER — Encounter (HOSPITAL_COMMUNITY)
Admission: RE | Admit: 2019-11-16 | Discharge: 2019-11-16 | Disposition: A | Discharge status: Home / Self Care | Payer: Medicare HMO | Source: Ambulatory Visit | Attending: Vascular Surgery | Admitting: Vascular Surgery

## 2019-11-16 ENCOUNTER — Other Ambulatory Visit: Payer: Self-pay

## 2019-11-16 ENCOUNTER — Encounter (HOSPITAL_COMMUNITY)
Admission: RE | Admit: 2019-11-16 | Discharge: 2019-11-16 | Disposition: A | Discharge status: Home / Self Care | Payer: Medicare HMO | Source: Ambulatory Visit | Attending: Internal Medicine | Admitting: Internal Medicine

## 2019-11-16 DIAGNOSIS — D631 Anemia in chronic kidney disease: Secondary | ICD-10-CM | POA: Diagnosis not present

## 2019-11-16 DIAGNOSIS — N185 Chronic kidney disease, stage 5: Secondary | ICD-10-CM | POA: Diagnosis present

## 2019-11-16 LAB — IRON AND TIBC
Iron: 53 ug/dL (ref 28–170)
Saturation Ratios: 24 % (ref 10.4–31.8)
TIBC: 220 ug/dL — ABNORMAL LOW (ref 250–450)
UIBC: 167 ug/dL

## 2019-11-16 LAB — RENAL FUNCTION PANEL
Albumin: 3.6 g/dL (ref 3.5–5.0)
Anion gap: 11 (ref 5–15)
BUN: 36 mg/dL — ABNORMAL HIGH (ref 8–23)
CO2: 25 mmol/L (ref 22–32)
Calcium: 8.9 mg/dL (ref 8.9–10.3)
Chloride: 102 mmol/L (ref 98–111)
Creatinine, Ser: 2.38 mg/dL — ABNORMAL HIGH (ref 0.44–1.00)
GFR calc Af Amer: 23 mL/min — ABNORMAL LOW (ref >60)
GFR calc non Af Amer: 20 mL/min — ABNORMAL LOW (ref >60)
Glucose, Bld: 130 mg/dL — ABNORMAL HIGH (ref 70–99)
Phosphorus: 4.5 mg/dL (ref 2.5–4.6)
Potassium: 3.6 mmol/L (ref 3.5–5.1)
Sodium: 138 mmol/L (ref 135–145)

## 2019-11-16 LAB — FERRITIN: Ferritin: 292 ng/mL (ref 11–307)

## 2019-11-16 LAB — POCT HEMOGLOBIN-HEMACUE: Hemoglobin: 10.8 g/dL — ABNORMAL LOW (ref 12.0–15.0)

## 2019-11-16 MED ORDER — EPOETIN ALFA-EPBX 10000 UNIT/ML IJ SOLN
INTRAMUSCULAR | Status: AC
  Filled 2019-11-16: qty 1

## 2019-11-16 MED ORDER — EPOETIN ALFA-EPBX 10000 UNIT/ML IJ SOLN
10000.0000 [IU] | Freq: Once | INTRAMUSCULAR | Status: AC
  Administered 2019-11-16: 10000 [IU] via SUBCUTANEOUS

## 2019-11-17 LAB — PARATHYROID HORMONE, INTACT (NO CA): PTH: 43 pg/mL (ref 15–65)

## 2019-11-21 ENCOUNTER — Other Ambulatory Visit: Payer: Self-pay | Admitting: Geriatric Medicine

## 2019-11-21 DIAGNOSIS — Z1231 Encounter for screening mammogram for malignant neoplasm of breast: Secondary | ICD-10-CM

## 2019-11-21 DIAGNOSIS — R69 Illness, unspecified: Secondary | ICD-10-CM | POA: Diagnosis not present

## 2019-11-28 ENCOUNTER — Ambulatory Visit (HOSPITAL_COMMUNITY): Payer: Medicare HMO

## 2019-11-28 ENCOUNTER — Other Ambulatory Visit (HOSPITAL_COMMUNITY): Payer: Medicare HMO

## 2019-11-28 DIAGNOSIS — E1121 Type 2 diabetes mellitus with diabetic nephropathy: Secondary | ICD-10-CM | POA: Diagnosis not present

## 2019-11-28 DIAGNOSIS — E7800 Pure hypercholesterolemia, unspecified: Secondary | ICD-10-CM | POA: Diagnosis not present

## 2019-11-28 DIAGNOSIS — E119 Type 2 diabetes mellitus without complications: Secondary | ICD-10-CM | POA: Diagnosis not present

## 2019-11-28 DIAGNOSIS — N184 Chronic kidney disease, stage 4 (severe): Secondary | ICD-10-CM | POA: Diagnosis not present

## 2019-11-28 DIAGNOSIS — I129 Hypertensive chronic kidney disease with stage 1 through stage 4 chronic kidney disease, or unspecified chronic kidney disease: Secondary | ICD-10-CM | POA: Diagnosis not present

## 2019-11-29 ENCOUNTER — Other Ambulatory Visit: Payer: Self-pay

## 2019-11-29 ENCOUNTER — Ambulatory Visit
Admission: RE | Admit: 2019-11-29 | Discharge: 2019-11-29 | Disposition: A | Discharge status: Home / Self Care | Payer: Medicare HMO | Source: Ambulatory Visit | Attending: Geriatric Medicine | Admitting: Geriatric Medicine

## 2019-11-29 DIAGNOSIS — Z1231 Encounter for screening mammogram for malignant neoplasm of breast: Secondary | ICD-10-CM

## 2019-12-10 ENCOUNTER — Other Ambulatory Visit: Payer: Self-pay | Admitting: Physician Assistant

## 2019-12-10 ENCOUNTER — Ambulatory Visit
Admission: RE | Admit: 2019-12-10 | Discharge: 2019-12-10 | Disposition: A | Discharge status: Home / Self Care | Payer: Medicare HMO | Source: Ambulatory Visit | Attending: Physician Assistant | Admitting: Physician Assistant

## 2019-12-10 DIAGNOSIS — E538 Deficiency of other specified B group vitamins: Secondary | ICD-10-CM | POA: Diagnosis not present

## 2019-12-10 DIAGNOSIS — M25472 Effusion, left ankle: Secondary | ICD-10-CM | POA: Diagnosis not present

## 2019-12-10 DIAGNOSIS — M7989 Other specified soft tissue disorders: Secondary | ICD-10-CM | POA: Diagnosis not present

## 2019-12-17 ENCOUNTER — Other Ambulatory Visit: Payer: Self-pay

## 2019-12-17 ENCOUNTER — Encounter (HOSPITAL_COMMUNITY)
Admission: RE | Admit: 2019-12-17 | Discharge: 2019-12-17 | Disposition: A | Discharge status: Home / Self Care | Payer: Medicare HMO | Source: Ambulatory Visit | Attending: Internal Medicine | Admitting: Internal Medicine

## 2019-12-17 ENCOUNTER — Encounter (HOSPITAL_COMMUNITY): Payer: Self-pay

## 2019-12-17 DIAGNOSIS — D631 Anemia in chronic kidney disease: Secondary | ICD-10-CM | POA: Diagnosis not present

## 2019-12-17 DIAGNOSIS — N185 Chronic kidney disease, stage 5: Secondary | ICD-10-CM | POA: Diagnosis not present

## 2019-12-17 LAB — IRON AND TIBC
Iron: 37 ug/dL (ref 28–170)
Saturation Ratios: 15 % (ref 10.4–31.8)
TIBC: 252 ug/dL (ref 250–450)
UIBC: 215 ug/dL

## 2019-12-17 LAB — FERRITIN: Ferritin: 354 ng/mL — ABNORMAL HIGH (ref 11–307)

## 2019-12-17 MED ORDER — EPOETIN ALFA-EPBX 10000 UNIT/ML IJ SOLN
10000.0000 [IU] | Freq: Once | INTRAMUSCULAR | Status: AC
  Administered 2019-12-17: 10000 [IU] via SUBCUTANEOUS

## 2019-12-17 MED ORDER — EPOETIN ALFA-EPBX 10000 UNIT/ML IJ SOLN
INTRAMUSCULAR | Status: AC
  Filled 2019-12-17: qty 1

## 2019-12-19 DIAGNOSIS — E1121 Type 2 diabetes mellitus with diabetic nephropathy: Secondary | ICD-10-CM | POA: Diagnosis not present

## 2019-12-19 DIAGNOSIS — N184 Chronic kidney disease, stage 4 (severe): Secondary | ICD-10-CM | POA: Diagnosis not present

## 2019-12-19 DIAGNOSIS — E7800 Pure hypercholesterolemia, unspecified: Secondary | ICD-10-CM | POA: Diagnosis not present

## 2019-12-19 DIAGNOSIS — I129 Hypertensive chronic kidney disease with stage 1 through stage 4 chronic kidney disease, or unspecified chronic kidney disease: Secondary | ICD-10-CM | POA: Diagnosis not present

## 2019-12-19 DIAGNOSIS — E119 Type 2 diabetes mellitus without complications: Secondary | ICD-10-CM | POA: Diagnosis not present

## 2019-12-19 LAB — POCT HEMOGLOBIN-HEMACUE: Hemoglobin: 11.1 g/dL — ABNORMAL LOW (ref 12.0–15.0)

## 2019-12-22 DIAGNOSIS — R69 Illness, unspecified: Secondary | ICD-10-CM | POA: Diagnosis not present

## 2020-01-03 DIAGNOSIS — N184 Chronic kidney disease, stage 4 (severe): Secondary | ICD-10-CM | POA: Diagnosis not present

## 2020-01-03 DIAGNOSIS — E1121 Type 2 diabetes mellitus with diabetic nephropathy: Secondary | ICD-10-CM | POA: Diagnosis not present

## 2020-01-03 DIAGNOSIS — E119 Type 2 diabetes mellitus without complications: Secondary | ICD-10-CM | POA: Diagnosis not present

## 2020-01-03 DIAGNOSIS — E7800 Pure hypercholesterolemia, unspecified: Secondary | ICD-10-CM | POA: Diagnosis not present

## 2020-01-03 DIAGNOSIS — I129 Hypertensive chronic kidney disease with stage 1 through stage 4 chronic kidney disease, or unspecified chronic kidney disease: Secondary | ICD-10-CM | POA: Diagnosis not present

## 2020-01-07 ENCOUNTER — Encounter (HOSPITAL_COMMUNITY)
Admission: RE | Admit: 2020-01-07 | Discharge: 2020-01-07 | Disposition: A | Discharge status: Home / Self Care | Payer: Medicare HMO | Source: Ambulatory Visit | Attending: Internal Medicine | Admitting: Internal Medicine

## 2020-01-07 ENCOUNTER — Other Ambulatory Visit: Payer: Self-pay

## 2020-01-07 DIAGNOSIS — D631 Anemia in chronic kidney disease: Secondary | ICD-10-CM | POA: Insufficient documentation

## 2020-01-07 DIAGNOSIS — N189 Chronic kidney disease, unspecified: Secondary | ICD-10-CM | POA: Diagnosis not present

## 2020-01-07 MED ORDER — SODIUM CHLORIDE 0.9 % IV SOLN
510.0000 mg | Freq: Once | INTRAVENOUS | Status: AC
  Administered 2020-01-07: 510 mg via INTRAVENOUS
  Filled 2020-01-07: qty 17

## 2020-01-07 MED ORDER — SODIUM CHLORIDE 0.9 % IV SOLN: Freq: Once | INTRAVENOUS | Status: AC

## 2020-01-09 DIAGNOSIS — N185 Chronic kidney disease, stage 5: Secondary | ICD-10-CM | POA: Diagnosis not present

## 2020-01-09 DIAGNOSIS — I120 Hypertensive chronic kidney disease with stage 5 chronic kidney disease or end stage renal disease: Secondary | ICD-10-CM | POA: Diagnosis not present

## 2020-01-09 DIAGNOSIS — E1122 Type 2 diabetes mellitus with diabetic chronic kidney disease: Secondary | ICD-10-CM | POA: Diagnosis not present

## 2020-01-09 DIAGNOSIS — D631 Anemia in chronic kidney disease: Secondary | ICD-10-CM | POA: Diagnosis not present

## 2020-01-09 DIAGNOSIS — N119 Chronic tubulo-interstitial nephritis, unspecified: Secondary | ICD-10-CM | POA: Diagnosis not present

## 2020-01-09 DIAGNOSIS — E876 Hypokalemia: Secondary | ICD-10-CM | POA: Diagnosis not present

## 2020-01-11 DIAGNOSIS — E538 Deficiency of other specified B group vitamins: Secondary | ICD-10-CM | POA: Diagnosis not present

## 2020-01-14 ENCOUNTER — Encounter (HOSPITAL_COMMUNITY): Payer: Self-pay

## 2020-01-14 ENCOUNTER — Encounter (HOSPITAL_COMMUNITY)
Admission: RE | Admit: 2020-01-14 | Discharge: 2020-01-14 | Disposition: A | Discharge status: Home / Self Care | Payer: Medicare HMO | Source: Ambulatory Visit | Attending: Internal Medicine | Admitting: Internal Medicine

## 2020-01-14 ENCOUNTER — Other Ambulatory Visit: Payer: Self-pay

## 2020-01-14 DIAGNOSIS — D631 Anemia in chronic kidney disease: Secondary | ICD-10-CM | POA: Diagnosis not present

## 2020-01-14 DIAGNOSIS — N189 Chronic kidney disease, unspecified: Secondary | ICD-10-CM | POA: Diagnosis not present

## 2020-01-14 LAB — RENAL FUNCTION PANEL
Albumin: 3.7 g/dL (ref 3.5–5.0)
Anion gap: 10 (ref 5–15)
BUN: 51 mg/dL — ABNORMAL HIGH (ref 8–23)
CO2: 24 mmol/L (ref 22–32)
Calcium: 9 mg/dL (ref 8.9–10.3)
Chloride: 105 mmol/L (ref 98–111)
Creatinine, Ser: 2.36 mg/dL — ABNORMAL HIGH (ref 0.44–1.00)
GFR calc Af Amer: 23 mL/min — ABNORMAL LOW (ref >60)
GFR calc non Af Amer: 20 mL/min — ABNORMAL LOW (ref >60)
Glucose, Bld: 210 mg/dL — ABNORMAL HIGH (ref 70–99)
Phosphorus: 3 mg/dL (ref 2.5–4.6)
Potassium: 3.9 mmol/L (ref 3.5–5.1)
Sodium: 139 mmol/L (ref 135–145)

## 2020-01-14 LAB — IRON AND TIBC
Iron: 105 ug/dL (ref 28–170)
Saturation Ratios: 43 % — ABNORMAL HIGH (ref 10.4–31.8)
TIBC: 242 ug/dL — ABNORMAL LOW (ref 250–450)
UIBC: 137 ug/dL

## 2020-01-14 LAB — POCT HEMOGLOBIN-HEMACUE: Hemoglobin: 10.8 g/dL — ABNORMAL LOW (ref 12.0–15.0)

## 2020-01-14 LAB — FERRITIN: Ferritin: 716 ng/mL — ABNORMAL HIGH (ref 11–307)

## 2020-01-14 MED ORDER — SODIUM CHLORIDE 0.9 % IV SOLN
510.0000 mg | Freq: Once | INTRAVENOUS | Status: AC
  Administered 2020-01-14: 510 mg via INTRAVENOUS
  Filled 2020-01-14: qty 17

## 2020-01-14 MED ORDER — SODIUM CHLORIDE 0.9 % IV SOLN: Freq: Once | INTRAVENOUS | Status: AC

## 2020-01-14 MED ORDER — EPOETIN ALFA-EPBX 10000 UNIT/ML IJ SOLN
10000.0000 [IU] | Freq: Once | INTRAMUSCULAR | Status: AC
  Administered 2020-01-14: 10000 [IU] via SUBCUTANEOUS
  Filled 2020-01-14: qty 1

## 2020-01-16 DIAGNOSIS — H5203 Hypermetropia, bilateral: Secondary | ICD-10-CM | POA: Diagnosis not present

## 2020-01-16 LAB — PARATHYROID HORMONE, INTACT (NO CA): PTH: 32 pg/mL (ref 15–65)

## 2020-01-22 DIAGNOSIS — R69 Illness, unspecified: Secondary | ICD-10-CM | POA: Diagnosis not present

## 2020-02-11 ENCOUNTER — Encounter (HOSPITAL_COMMUNITY)
Admission: RE | Admit: 2020-02-11 | Discharge: 2020-02-11 | Disposition: A | Discharge status: Home / Self Care | Payer: Medicare HMO | Source: Ambulatory Visit | Attending: Internal Medicine | Admitting: Internal Medicine

## 2020-02-11 ENCOUNTER — Encounter (HOSPITAL_COMMUNITY): Payer: Self-pay

## 2020-02-11 ENCOUNTER — Other Ambulatory Visit: Payer: Self-pay

## 2020-02-11 DIAGNOSIS — D631 Anemia in chronic kidney disease: Secondary | ICD-10-CM | POA: Diagnosis not present

## 2020-02-11 DIAGNOSIS — N185 Chronic kidney disease, stage 5: Secondary | ICD-10-CM | POA: Diagnosis present

## 2020-02-11 LAB — FERRITIN: Ferritin: UNDETERMINED ng/mL (ref 11–307)

## 2020-02-11 LAB — POCT HEMOGLOBIN-HEMACUE: Hemoglobin: 11.6 g/dL — ABNORMAL LOW (ref 12.0–15.0)

## 2020-02-11 LAB — IRON AND TIBC
Iron: 98 ug/dL (ref 28–170)
Saturation Ratios: 40 % — ABNORMAL HIGH (ref 10.4–31.8)
TIBC: 248 ug/dL — ABNORMAL LOW (ref 250–450)
UIBC: 150 ug/dL

## 2020-02-11 MED ORDER — EPOETIN ALFA-EPBX 10000 UNIT/ML IJ SOLN
10000.0000 [IU] | Freq: Once | INTRAMUSCULAR | Status: AC
  Administered 2020-02-11: 10000 [IU] via SUBCUTANEOUS
  Filled 2020-02-11: qty 1

## 2020-02-14 DIAGNOSIS — M25572 Pain in left ankle and joints of left foot: Secondary | ICD-10-CM | POA: Diagnosis not present

## 2020-02-14 DIAGNOSIS — M79672 Pain in left foot: Secondary | ICD-10-CM | POA: Diagnosis not present

## 2020-02-14 DIAGNOSIS — M19072 Primary osteoarthritis, left ankle and foot: Secondary | ICD-10-CM | POA: Diagnosis not present

## 2020-02-14 DIAGNOSIS — E538 Deficiency of other specified B group vitamins: Secondary | ICD-10-CM | POA: Diagnosis not present

## 2020-02-18 DIAGNOSIS — N184 Chronic kidney disease, stage 4 (severe): Secondary | ICD-10-CM | POA: Diagnosis not present

## 2020-02-18 DIAGNOSIS — E1121 Type 2 diabetes mellitus with diabetic nephropathy: Secondary | ICD-10-CM | POA: Diagnosis not present

## 2020-02-18 DIAGNOSIS — M25572 Pain in left ankle and joints of left foot: Secondary | ICD-10-CM | POA: Diagnosis not present

## 2020-02-18 DIAGNOSIS — I129 Hypertensive chronic kidney disease with stage 1 through stage 4 chronic kidney disease, or unspecified chronic kidney disease: Secondary | ICD-10-CM | POA: Diagnosis not present

## 2020-02-18 DIAGNOSIS — Z23 Encounter for immunization: Secondary | ICD-10-CM | POA: Diagnosis not present

## 2020-02-18 DIAGNOSIS — K220 Achalasia of cardia: Secondary | ICD-10-CM | POA: Diagnosis not present

## 2020-02-18 DIAGNOSIS — E7800 Pure hypercholesterolemia, unspecified: Secondary | ICD-10-CM | POA: Diagnosis not present

## 2020-02-18 DIAGNOSIS — Z1389 Encounter for screening for other disorder: Secondary | ICD-10-CM | POA: Diagnosis not present

## 2020-02-18 DIAGNOSIS — E538 Deficiency of other specified B group vitamins: Secondary | ICD-10-CM | POA: Diagnosis not present

## 2020-02-18 DIAGNOSIS — Z0000 Encounter for general adult medical examination without abnormal findings: Secondary | ICD-10-CM | POA: Diagnosis not present

## 2020-02-18 DIAGNOSIS — Z79899 Other long term (current) drug therapy: Secondary | ICD-10-CM | POA: Diagnosis not present

## 2020-02-22 DIAGNOSIS — R69 Illness, unspecified: Secondary | ICD-10-CM | POA: Diagnosis not present

## 2020-02-25 ENCOUNTER — Other Ambulatory Visit: Payer: Self-pay

## 2020-02-25 ENCOUNTER — Ambulatory Visit
Admission: RE | Admit: 2020-02-25 | Discharge: 2020-02-25 | Disposition: A | Discharge status: Home / Self Care | Payer: Medicare HMO | Source: Ambulatory Visit | Attending: Geriatric Medicine | Admitting: Geriatric Medicine

## 2020-02-25 DIAGNOSIS — H52223 Regular astigmatism, bilateral: Secondary | ICD-10-CM | POA: Diagnosis not present

## 2020-02-25 DIAGNOSIS — Z1231 Encounter for screening mammogram for malignant neoplasm of breast: Secondary | ICD-10-CM | POA: Diagnosis not present

## 2020-02-25 DIAGNOSIS — H524 Presbyopia: Secondary | ICD-10-CM | POA: Diagnosis not present

## 2020-03-10 ENCOUNTER — Other Ambulatory Visit: Payer: Self-pay

## 2020-03-10 ENCOUNTER — Encounter (HOSPITAL_COMMUNITY): Payer: Self-pay

## 2020-03-10 ENCOUNTER — Encounter (HOSPITAL_COMMUNITY)
Admission: RE | Admit: 2020-03-10 | Discharge: 2020-03-10 | Disposition: A | Discharge status: Home / Self Care | Payer: Medicare HMO | Source: Ambulatory Visit | Attending: Internal Medicine | Admitting: Internal Medicine

## 2020-03-10 DIAGNOSIS — M19072 Primary osteoarthritis, left ankle and foot: Secondary | ICD-10-CM | POA: Diagnosis not present

## 2020-03-10 DIAGNOSIS — D631 Anemia in chronic kidney disease: Secondary | ICD-10-CM | POA: Diagnosis not present

## 2020-03-10 DIAGNOSIS — N185 Chronic kidney disease, stage 5: Secondary | ICD-10-CM | POA: Diagnosis present

## 2020-03-10 DIAGNOSIS — M25552 Pain in left hip: Secondary | ICD-10-CM | POA: Diagnosis not present

## 2020-03-10 LAB — RENAL FUNCTION PANEL
Albumin: 4 g/dL (ref 3.5–5.0)
Anion gap: 13 (ref 5–15)
BUN: 56 mg/dL — ABNORMAL HIGH (ref 8–23)
CO2: 22 mmol/L (ref 22–32)
Calcium: 9 mg/dL (ref 8.9–10.3)
Chloride: 104 mmol/L (ref 98–111)
Creatinine, Ser: 2.81 mg/dL — ABNORMAL HIGH (ref 0.44–1.00)
GFR, Estimated: 16 mL/min — ABNORMAL LOW (ref >60)
Glucose, Bld: 133 mg/dL — ABNORMAL HIGH (ref 70–99)
Phosphorus: 5.4 mg/dL — ABNORMAL HIGH (ref 2.5–4.6)
Potassium: 4.2 mmol/L (ref 3.5–5.1)
Sodium: 139 mmol/L (ref 135–145)

## 2020-03-10 LAB — IRON AND TIBC
Iron: 88 ug/dL (ref 28–170)
Saturation Ratios: 32 % — ABNORMAL HIGH (ref 10.4–31.8)
TIBC: 272 ug/dL (ref 250–450)
UIBC: 184 ug/dL

## 2020-03-10 LAB — POCT HEMOGLOBIN-HEMACUE: Hemoglobin: 11.3 g/dL — ABNORMAL LOW (ref 12.0–15.0)

## 2020-03-10 LAB — FERRITIN: Ferritin: 545 ng/mL — ABNORMAL HIGH (ref 11–307)

## 2020-03-10 MED ORDER — EPOETIN ALFA-EPBX 10000 UNIT/ML IJ SOLN
10000.0000 [IU] | Freq: Once | INTRAMUSCULAR | Status: AC
  Administered 2020-03-10: 10000 [IU] via SUBCUTANEOUS

## 2020-03-10 MED ORDER — EPOETIN ALFA-EPBX 10000 UNIT/ML IJ SOLN
INTRAMUSCULAR | Status: AC
  Filled 2020-03-10: qty 1

## 2020-03-11 LAB — PARATHYROID HORMONE, INTACT (NO CA): PTH: 47 pg/mL (ref 15–65)

## 2020-03-18 DIAGNOSIS — E538 Deficiency of other specified B group vitamins: Secondary | ICD-10-CM | POA: Diagnosis not present

## 2020-03-21 DIAGNOSIS — M25552 Pain in left hip: Secondary | ICD-10-CM | POA: Diagnosis not present

## 2020-03-21 DIAGNOSIS — M1612 Unilateral primary osteoarthritis, left hip: Secondary | ICD-10-CM | POA: Diagnosis not present

## 2020-03-27 DIAGNOSIS — M1712 Unilateral primary osteoarthritis, left knee: Secondary | ICD-10-CM | POA: Diagnosis not present

## 2020-03-28 DIAGNOSIS — I129 Hypertensive chronic kidney disease with stage 1 through stage 4 chronic kidney disease, or unspecified chronic kidney disease: Secondary | ICD-10-CM | POA: Diagnosis not present

## 2020-03-28 DIAGNOSIS — N184 Chronic kidney disease, stage 4 (severe): Secondary | ICD-10-CM | POA: Diagnosis not present

## 2020-03-28 DIAGNOSIS — E7800 Pure hypercholesterolemia, unspecified: Secondary | ICD-10-CM | POA: Diagnosis not present

## 2020-03-28 DIAGNOSIS — E1121 Type 2 diabetes mellitus with diabetic nephropathy: Secondary | ICD-10-CM | POA: Diagnosis not present

## 2020-04-03 DIAGNOSIS — M1712 Unilateral primary osteoarthritis, left knee: Secondary | ICD-10-CM | POA: Diagnosis not present

## 2020-04-07 ENCOUNTER — Encounter (HOSPITAL_COMMUNITY): Admission: RE | Admit: 2020-04-07 | Payer: Medicare HMO | Source: Ambulatory Visit

## 2020-04-07 ENCOUNTER — Encounter (HOSPITAL_COMMUNITY): Payer: Medicare HMO

## 2020-04-08 ENCOUNTER — Other Ambulatory Visit: Payer: Self-pay

## 2020-04-08 ENCOUNTER — Encounter (HOSPITAL_COMMUNITY): Payer: Self-pay

## 2020-04-08 ENCOUNTER — Encounter (HOSPITAL_COMMUNITY)
Admission: RE | Admit: 2020-04-08 | Discharge: 2020-04-08 | Disposition: A | Discharge status: Home / Self Care | Payer: Medicare HMO | Source: Ambulatory Visit | Attending: Internal Medicine | Admitting: Internal Medicine

## 2020-04-08 DIAGNOSIS — N185 Chronic kidney disease, stage 5: Secondary | ICD-10-CM | POA: Diagnosis not present

## 2020-04-08 DIAGNOSIS — D631 Anemia in chronic kidney disease: Secondary | ICD-10-CM | POA: Insufficient documentation

## 2020-04-08 LAB — IRON AND TIBC
Iron: 95 ug/dL (ref 28–170)
Saturation Ratios: 40 % — ABNORMAL HIGH (ref 10.4–31.8)
TIBC: 237 ug/dL — ABNORMAL LOW (ref 250–450)
UIBC: 142 ug/dL

## 2020-04-08 LAB — FERRITIN: Ferritin: 661 ng/mL — ABNORMAL HIGH (ref 11–307)

## 2020-04-08 LAB — POCT HEMOGLOBIN-HEMACUE: Hemoglobin: 11.5 g/dL — ABNORMAL LOW (ref 12.0–15.0)

## 2020-04-08 MED ORDER — EPOETIN ALFA-EPBX 10000 UNIT/ML IJ SOLN
10000.0000 [IU] | Freq: Once | INTRAMUSCULAR | Status: AC
  Administered 2020-04-08: 10000 [IU] via SUBCUTANEOUS

## 2020-04-08 MED ORDER — EPOETIN ALFA-EPBX 10000 UNIT/ML IJ SOLN
INTRAMUSCULAR | Status: AC
  Filled 2020-04-08: qty 1

## 2020-04-09 DIAGNOSIS — M25552 Pain in left hip: Secondary | ICD-10-CM | POA: Diagnosis not present

## 2020-04-16 DIAGNOSIS — M1712 Unilateral primary osteoarthritis, left knee: Secondary | ICD-10-CM | POA: Diagnosis not present

## 2020-04-17 DIAGNOSIS — N119 Chronic tubulo-interstitial nephritis, unspecified: Secondary | ICD-10-CM | POA: Diagnosis not present

## 2020-04-17 DIAGNOSIS — N185 Chronic kidney disease, stage 5: Secondary | ICD-10-CM | POA: Diagnosis not present

## 2020-04-17 DIAGNOSIS — D631 Anemia in chronic kidney disease: Secondary | ICD-10-CM | POA: Diagnosis not present

## 2020-04-17 DIAGNOSIS — E1122 Type 2 diabetes mellitus with diabetic chronic kidney disease: Secondary | ICD-10-CM | POA: Diagnosis not present

## 2020-04-17 DIAGNOSIS — I120 Hypertensive chronic kidney disease with stage 5 chronic kidney disease or end stage renal disease: Secondary | ICD-10-CM | POA: Diagnosis not present

## 2020-04-17 DIAGNOSIS — E876 Hypokalemia: Secondary | ICD-10-CM | POA: Diagnosis not present

## 2020-04-19 ENCOUNTER — Emergency Department (HOSPITAL_COMMUNITY): Payer: Medicare HMO

## 2020-04-19 ENCOUNTER — Encounter (HOSPITAL_COMMUNITY): Payer: Self-pay | Admitting: Emergency Medicine

## 2020-04-19 ENCOUNTER — Emergency Department (HOSPITAL_COMMUNITY)
Admission: EM | Admit: 2020-04-19 | Discharge: 2020-04-19 | Disposition: A | Discharge status: Home / Self Care | Payer: Medicare HMO | Attending: Emergency Medicine | Admitting: Emergency Medicine

## 2020-04-19 ENCOUNTER — Other Ambulatory Visit: Payer: Self-pay

## 2020-04-19 DIAGNOSIS — N189 Chronic kidney disease, unspecified: Secondary | ICD-10-CM | POA: Insufficient documentation

## 2020-04-19 DIAGNOSIS — R0789 Other chest pain: Secondary | ICD-10-CM

## 2020-04-19 DIAGNOSIS — E1122 Type 2 diabetes mellitus with diabetic chronic kidney disease: Secondary | ICD-10-CM | POA: Insufficient documentation

## 2020-04-19 DIAGNOSIS — Z7984 Long term (current) use of oral hypoglycemic drugs: Secondary | ICD-10-CM | POA: Diagnosis not present

## 2020-04-19 DIAGNOSIS — I129 Hypertensive chronic kidney disease with stage 1 through stage 4 chronic kidney disease, or unspecified chronic kidney disease: Secondary | ICD-10-CM | POA: Insufficient documentation

## 2020-04-19 DIAGNOSIS — R079 Chest pain, unspecified: Secondary | ICD-10-CM | POA: Diagnosis not present

## 2020-04-19 LAB — CBC WITH DIFFERENTIAL/PLATELET
Abs Immature Granulocytes: 0.17 10*3/uL — ABNORMAL HIGH (ref 0.00–0.07)
Basophils Absolute: 0.1 10*3/uL (ref 0.0–0.1)
Basophils Relative: 0 %
Eosinophils Absolute: 0.3 10*3/uL (ref 0.0–0.5)
Eosinophils Relative: 2 %
HCT: 42 % (ref 36.0–46.0)
Hemoglobin: 12.8 g/dL (ref 12.0–15.0)
Immature Granulocytes: 1 %
Lymphocytes Relative: 14 %
Lymphs Abs: 2.5 10*3/uL (ref 0.7–4.0)
MCH: 30.1 pg (ref 26.0–34.0)
MCHC: 30.5 g/dL (ref 30.0–36.0)
MCV: 98.8 fL (ref 80.0–100.0)
Monocytes Absolute: 1.4 10*3/uL — ABNORMAL HIGH (ref 0.1–1.0)
Monocytes Relative: 8 %
Neutro Abs: 13.7 10*3/uL — ABNORMAL HIGH (ref 1.7–7.7)
Neutrophils Relative %: 75 %
Platelets: 190 10*3/uL (ref 150–400)
RBC: 4.25 MIL/uL (ref 3.87–5.11)
RDW: 15.1 % (ref 11.5–15.5)
WBC: 18.1 10*3/uL — ABNORMAL HIGH (ref 4.0–10.5)
nRBC: 0 % (ref 0.0–0.2)

## 2020-04-19 LAB — COMPREHENSIVE METABOLIC PANEL
ALT: 19 U/L (ref 0–44)
AST: 13 U/L — ABNORMAL LOW (ref 15–41)
Albumin: 3.8 g/dL (ref 3.5–5.0)
Alkaline Phosphatase: 119 U/L (ref 38–126)
Anion gap: 8 (ref 5–15)
BUN: 47 mg/dL — ABNORMAL HIGH (ref 8–23)
CO2: 18 mmol/L — ABNORMAL LOW (ref 22–32)
Calcium: 9.1 mg/dL (ref 8.9–10.3)
Chloride: 113 mmol/L — ABNORMAL HIGH (ref 98–111)
Creatinine, Ser: 2.28 mg/dL — ABNORMAL HIGH (ref 0.44–1.00)
GFR, Estimated: 22 mL/min — ABNORMAL LOW (ref >60)
Glucose, Bld: 82 mg/dL (ref 70–99)
Potassium: 4.8 mmol/L (ref 3.5–5.1)
Sodium: 139 mmol/L (ref 135–145)
Total Bilirubin: 0.8 mg/dL (ref 0.3–1.2)
Total Protein: 7.3 g/dL (ref 6.5–8.1)

## 2020-04-19 LAB — TROPONIN I (HIGH SENSITIVITY): Troponin I (High Sensitivity): 7 ng/L (ref <18)

## 2020-04-19 MED ORDER — TRAMADOL HCL 50 MG PO TABS
50.0000 mg | ORAL_TABLET | Freq: Four times a day (QID) | ORAL | 0 refills | Status: AC | PRN
Start: 2020-04-19 — End: 2021-04-19

## 2020-04-19 MED ORDER — LIDOCAINE VISCOUS HCL 2 % MT SOLN
15.0000 mL | Freq: Once | OROMUCOSAL | Status: AC
  Administered 2020-04-19: 15 mL via ORAL
  Filled 2020-04-19: qty 15

## 2020-04-19 MED ORDER — ALUM & MAG HYDROXIDE-SIMETH 200-200-20 MG/5ML PO SUSP
30.0000 mL | Freq: Once | ORAL | Status: AC
  Administered 2020-04-19: 30 mL via ORAL
  Filled 2020-04-19: qty 30

## 2020-04-19 MED ORDER — METHOCARBAMOL 500 MG PO TABS: 500.0000 mg | ORAL_TABLET | Freq: Four times a day (QID) | ORAL | 0 refills | Status: DC

## 2020-04-19 NOTE — ED Triage Notes (Signed)
Patient C/o right side chest pain that radiates into back. Per patient started yesterday while "sitting around" in which she thought could be heartburn. Patient took Pepcid AC and Tylenol with no improvement. Denies any shortness of breath, nausea, vomiting, or dizziness. Denies any cardiac hx.

## 2020-04-19 NOTE — Discharge Instructions (Signed)
Return if any problems.

## 2020-04-19 NOTE — ED Provider Notes (Signed)
[facility] EMERGENCY DEPARTMENT Provider Note   CSN: [id number] Arrival date & time: 04/19/20  2637     History Chief Complaint  Patient presents with  . Chest Pain    Beth Malone is a 74 y.o. female.  The history is provided by the patient. No language interpreter was used.  Chest Pain Pain location:  Substernal area Pain quality: aching   Pain radiates to:  Does not radiate Pain severity:  Moderate Onset quality:  Gradual Duration:  1 day Timing:  Constant Progression:  Worsening Chronicity:  New Relieved by:  Nothing Worsened by:  Nothing Risk factors: hypertension   Pt complains of pain to the right side of her chest. Pt reports she thought pain was from reflux but pain has continued      Past Medical History:  Diagnosis Date  . Anemia   . Anxiety   . Arthritis   . Chronic kidney disease    sees [facility]  . Diabetes mellitus    type II  . Dysphagia    last year or so   . Femur fracture ([place])   . GERD (gastroesophageal reflux disease)   . Heart murmur   . History of blood transfusion    tumor on tube and   . History of kidney stones    "stones in kidney"  . Hypertension     There are no problems to display for this patient.   Past Surgical History:  Procedure Laterality Date  . ABDOMINAL HYSTERECTOMY     complete  . AV FISTULA PLACEMENT Left 10/03/2019   Procedure: LEFT ARM ARTERIOVENOUS (AV) FISTULA CREATION;  Surgeon: [name], MD;  Location: [facility];  Service: Vascular;  Laterality: Left;  . BALLOON DILATION N/A 03/22/2016   Procedure: BALLOON DILATION;  Surgeon: [name], MD;  Location: [facility];  Service: Endoscopy;  Laterality: N/A;  . colonscopy  2014  . ESOPHAGEAL MANOMETRY N/A 04/05/2016   Procedure: ESOPHAGEAL MANOMETRY (EM);  Surgeon: [name], MD;  [facility];  Service: Endoscopy;  Laterality: N/A;  . ESOPHAGOGASTRODUODENOSCOPY (EGD) WITH PROPOFOL N/A 03/22/2016    Procedure: ESOPHAGOGASTRODUODENOSCOPY (EGD) WITH PROPOFOL;  Surgeon: [name], MD;  [facility];  Service: Endoscopy;  Laterality: N/A;  . ESOPHAGOGASTRODUODENOSCOPY (EGD) WITH PROPOFOL N/A 06/01/2016   Procedure: ESOPHAGOGASTRODUODENOSCOPY (EGD) WITH PROPOFOL;  Surgeon: [name], MD;  [facility];  Service: Endoscopy;  Laterality: N/A;  . FEMUR SURGERY Right 2011   rod placed  . LIGATION OF ARTERIOVENOUS  FISTULA Left 10/10/2019   Procedure: LIGATION OF LEFT ARM FISTULA;  Surgeon: [name], MD;  Location: [place];  Service: Vascular;  Laterality: Left;  . TUBAL LIGATION       OB History    Gravida  2   Para  2   Term  2   Preterm      AB      Living  2     SAB      TAB      Ectopic      Multiple      Live Births              Family History  Problem Relation Age of Onset  . Diabetes Mother   . Heart failure Mother   . Heart failure Father     Social History   Tobacco Use  . Smoking status: Never Smoker  . Smokeless tobacco: Never Used  Vaping Use  .  Vaping Use: Never used  Substance Use Topics  . Alcohol use: Yes    Comment: rarely drink wine  . Drug use: No    Home Medications Prior to Admission medications   Medication Sig Start Date End Date Taking? Authorizing Provider  acetaminophen (TYLENOL) 500 MG tablet Take 500 mg by mouth every 6 (six) hours as needed.    [provider]  ALPRAZolam Duanne Moron) 0.25 MG tablet Take 0.25 mg by mouth daily as needed for anxiety. anxiety     [provider]  amLODipine (NORVASC) 10 MG tablet Take 10 mg by mouth daily. 04/10/16   [provider]  atorvastatin (LIPITOR) 20 MG tablet Take 20 mg by mouth at bedtime.     [provider]  Biotin 1000 MCG tablet Take 1,000 mcg by mouth daily.    [provider]  cetirizine (ZYRTEC) 10 MG tablet Take 10 mg by mouth daily as needed for allergies.     [provider]  epoetin  alfa-epbx (RETACRIT) 12458 UNIT/ML injection 10,000 Units every 30 (thirty) days.     Justin Mend, MD  epoetin alfa-epbx (RETACRIT) 09983 UNIT/ML injection Inject 15,000 Units into the skin.     Justin Mend, MD  famotidine (PEPCID) 20 MG tablet Take 40 mg by mouth 2 (two) times daily.     [provider]  fluticasone (FLONASE) 50 MCG/ACT nasal spray Place 1 spray into both nostrils daily as needed for allergies or rhinitis.    [provider]  furosemide (LASIX) 40 MG tablet Take 40 mg by mouth 2 (two) times daily.     Justin Mend, MD  glipiZIDE (GLUCOTROL) 5 MG tablet Take 5 mg by mouth 2 (two) times daily before a meal.     [provider]  methocarbamol (ROBAXIN) 500 MG tablet Take 1 tablet (500 mg total) by mouth 4 (four) times daily. 04/19/20   Fransico Meadow, PA-C  Metoprolol Succinate 50 MG CS24 Take 50 mg by mouth daily.     [provider]  Potassium Chloride ER 20 MEQ TBCR Take 40 mEq by mouth daily.  07/17/19   [provider]  traMADol (ULTRAM) 50 MG tablet Take 1 tablet (50 mg total) by mouth every 6 (six) hours as needed. 04/19/20 04/19/21  Fransico Meadow, PA-C  Turmeric 500 MG TABS Take 500 mg by mouth 2 (two) times daily.     [provider]    Allergies    Augmentin [amoxicillin-pot clavulanate] and Codeine  Review of Systems   Review of Systems  Cardiovascular: Positive for chest pain.  All other systems reviewed and are negative.   Physical Exam Updated Vital Signs BP (!) 169/90   Pulse 71   Temp 98.1 F (36.7 C) (Oral)   Resp (!) 23   Ht 5\' 4"  (1.626 m)   Wt 81.6 kg   SpO2 98%   BMI 30.90 kg/m   Physical Exam Vitals and nursing note reviewed.  Constitutional:      Appearance: She is well-developed.  HENT:     Head: Normocephalic.  Cardiovascular:     Rate and Rhythm: Normal rate and regular rhythm.     Heart sounds: Normal heart sounds.  Pulmonary:     Effort: Pulmonary effort is  normal.  Chest:     Chest wall: Tenderness present.     Comments: Tender right upper chest wall.   Abdominal:     General: There is no distension.  Palpations: Abdomen is soft.  Musculoskeletal:        General: Normal range of motion.     Cervical back: Normal range of motion.  Skin:    General: Skin is warm.  Neurological:     General: No focal deficit present.     Mental Status: She is alert and oriented to person, place, and time.  Psychiatric:        Mood and Affect: Mood normal.     ED Results / Procedures / Treatments   Labs (all labs ordered are listed, but only abnormal results are displayed) Labs Reviewed  CBC WITH DIFFERENTIAL/PLATELET - Abnormal; Notable for the following components:      Result Value   WBC 18.1 (*)    Neutro Abs 13.7 (*)    Monocytes Absolute 1.4 (*)    Abs Immature Granulocytes 0.17 (*)    All other components within normal limits  COMPREHENSIVE METABOLIC PANEL - Abnormal; Notable for the following components:   Chloride 113 (*)    CO2 18 (*)    BUN 47 (*)    Creatinine, Ser 2.28 (*)    AST 13 (*)    GFR, Estimated 22 (*)    All other components within normal limits  TROPONIN I (HIGH SENSITIVITY)  TROPONIN I (HIGH SENSITIVITY)    EKG EKG Interpretation  Date/Time:  Saturday April 19 2020 10:10:11 EST Ventricular Rate:  72 PR Interval:    QRS Duration: 93 QT Interval:  383 QTC Calculation: 420 R Axis:   28 Text Interpretation: Sinus rhythm Minimal ST elevation, inferior leads since last tracing no significant change Confirmed by Noemi Chapel [phone]) on 04/19/2020 11:44:20 AM   Radiology DG Chest Port 1 View  Result Date: 04/19/2020 CLINICAL DATA:  Chest pain. EXAM: PORTABLE CHEST 1 VIEW COMPARISON:  May 09, 2019 FINDINGS: The heart size and mediastinal contours are within normal limits. Both lungs are clear. The visualized skeletal structures are unremarkable. IMPRESSION: No active disease. Electronically Signed   By:  Dorise Bullion III M.D   On: 04/19/2020 10:52    Procedures Procedures (including critical care time)  Medications Ordered in ED Medications  alum & mag hydroxide-simeth (MAALOX/MYLANTA) 200-200-20 MG/5ML suspension 30 mL (30 mLs Oral Given 04/19/20 1122)    And  lidocaine (XYLOCAINE) 2 % viscous mouth solution 15 mL (15 mLs Oral Given 04/19/20 1123)    ED Course  I have reviewed the triage vital signs and the nursing notes.  Pertinent labs & imaging results that were available during my care of the patient were reviewed by me and considered in my medical decision making (see chart for details).    MDM Rules/Calculators/A&P                          MDM:  Troponin is negative, chest xray no acute abnormality Dr. Sabra Heck in to see and examine.  Pain seems muscular.  Pt given rx for robaxin and tramadol Final Clinical Impression(s) / ED Diagnoses Final diagnoses:  Chest wall pain    Rx / DC Orders ED Discharge Orders         Ordered    traMADol (ULTRAM) 50 MG tablet  Every 6 hours PRN        04/19/20 1235    methocarbamol (ROBAXIN) 500 MG tablet  4 times daily        04/19/20 1235           [place], Magda Paganini  K, PA-C 04/19/20 1346    Noemi Chapel, MD 04/20/20 [phone]

## 2020-04-19 NOTE — ED Provider Notes (Signed)
DiscThis patient is a 74 year old female, she presents with right-sided foot which started in the last day or so, seems to be worse with moving her arm or touching the chest, on my exam there is no signs of zoster, she does have reproducible tenderness with both palpation over the right lateral chest as well as with raising her arm.  She has minimal pain when she is resting at baseline.  She has no swelling of the legs, she is not tachycardic or hypoxic and lung sounds and heart sounds are normal.  EKG is unremarkable, I do not think she needs to have a CT evaluation.  Her blood counts are elevated but she has had recent steroid injections twice in the last week.  Patient is agreeable to the plan.   EKG Interpretation  Date/Time:  Saturday April 19 2020 10:10:11 EST Ventricular Rate:  72 PR Interval:    QRS Duration: 93 QT Interval:  383 QTC Calculation: 420 R Axis:   28 Text Interpretation: Sinus rhythm Minimal ST elevation, inferior leads since last tracing no significant change Confirmed by Noemi Chapel [phone]) on 04/19/2020 11:44:20 AM       Medical screening examination/treatment/procedure(s) were conducted as a shared visit with non-physician practitioner(s) and myself.  I personally evaluated the patient during the encounter.  Clinical Impression:   Final diagnoses:  Chest wall pain         Noemi Chapel, MD 04/20/20 [phone]

## 2020-04-22 DIAGNOSIS — M1712 Unilateral primary osteoarthritis, left knee: Secondary | ICD-10-CM | POA: Diagnosis not present

## 2020-04-28 DIAGNOSIS — M5451 Vertebrogenic low back pain: Secondary | ICD-10-CM | POA: Diagnosis not present

## 2020-04-28 DIAGNOSIS — M1612 Unilateral primary osteoarthritis, left hip: Secondary | ICD-10-CM | POA: Diagnosis not present

## 2020-04-30 DIAGNOSIS — E1121 Type 2 diabetes mellitus with diabetic nephropathy: Secondary | ICD-10-CM | POA: Diagnosis not present

## 2020-04-30 DIAGNOSIS — N184 Chronic kidney disease, stage 4 (severe): Secondary | ICD-10-CM | POA: Diagnosis not present

## 2020-04-30 DIAGNOSIS — E7800 Pure hypercholesterolemia, unspecified: Secondary | ICD-10-CM | POA: Diagnosis not present

## 2020-04-30 DIAGNOSIS — I129 Hypertensive chronic kidney disease with stage 1 through stage 4 chronic kidney disease, or unspecified chronic kidney disease: Secondary | ICD-10-CM | POA: Diagnosis not present

## 2020-05-05 ENCOUNTER — Ambulatory Visit (HOSPITAL_COMMUNITY): Payer: Medicare HMO | Attending: Orthopedic Surgery | Admitting: Physical Therapy

## 2020-05-05 ENCOUNTER — Other Ambulatory Visit: Payer: Self-pay

## 2020-05-05 ENCOUNTER — Encounter (HOSPITAL_COMMUNITY): Payer: Self-pay | Admitting: Physical Therapy

## 2020-05-05 DIAGNOSIS — M545 Low back pain, unspecified: Secondary | ICD-10-CM | POA: Diagnosis not present

## 2020-05-05 DIAGNOSIS — M25552 Pain in left hip: Secondary | ICD-10-CM | POA: Diagnosis not present

## 2020-05-05 DIAGNOSIS — R2689 Other abnormalities of gait and mobility: Secondary | ICD-10-CM | POA: Diagnosis not present

## 2020-05-05 NOTE — Therapy (Signed)
[place], [place], [address] Phone: [phone]   Fax:  [phone]  Physical Therapy Evaluation  Patient Details  Name: Beth Malone MRN: [id number] Date of Birth: Mar 12, 1946 Referring Provider (PT): Rod Can MD    Encounter Date: 05/05/2020   PT End of Session - 05/05/20 0848    Visit Number 1    Number of Visits 12    Date for PT Re-Evaluation 06/20/20    Authorization Type [employer] (No auth, no VL)    PT Start Time 0820    PT Stop Time 0900    PT Time Calculation (min) 40 min    Activity Tolerance Patient limited by fatigue    Behavior During Therapy [facility] for tasks assessed/performed           Past Medical History:  Diagnosis Date  . Anemia   . Anxiety   . Arthritis   . Chronic kidney disease    sees [facility]  . Diabetes mellitus    type II  . Dysphagia    last year or so   . Femur fracture ([place])   . GERD (gastroesophageal reflux disease)   . Heart murmur   . History of blood transfusion    tumor on tube and   . History of kidney stones    "stones in kidney"  . Hypertension     Past Surgical History:  Procedure Laterality Date  . ABDOMINAL HYSTERECTOMY     complete  . AV FISTULA PLACEMENT Left 10/03/2019   Procedure: LEFT ARM ARTERIOVENOUS (AV) FISTULA CREATION;  Surgeon: [name], MD;  Location: [place];  Service: Vascular;  Laterality: Left;  . BALLOON DILATION N/A 03/22/2016   Procedure: BALLOON DILATION;  Surgeon: [name], MD;  Location: [facility];  Service: Endoscopy;  Laterality: N/A;  . colonscopy  2014  . ESOPHAGEAL MANOMETRY N/A 04/05/2016   Procedure: ESOPHAGEAL MANOMETRY (EM);  Surgeon: [name], MD;  [facility];  Service: Endoscopy;  Laterality: N/A;  . ESOPHAGOGASTRODUODENOSCOPY (EGD) WITH PROPOFOL N/A 03/22/2016   Procedure: ESOPHAGOGASTRODUODENOSCOPY (EGD) WITH PROPOFOL;  Surgeon: [name], MD;   [facility];  Service: Endoscopy;  Laterality: N/A;  . ESOPHAGOGASTRODUODENOSCOPY (EGD) WITH PROPOFOL N/A 06/01/2016   Procedure: ESOPHAGOGASTRODUODENOSCOPY (EGD) WITH PROPOFOL;  Surgeon: [name], MD;  [facility];  Service: Endoscopy;  Laterality: N/A;  . FEMUR SURGERY Right 2011   rod placed  . LIGATION OF ARTERIOVENOUS  FISTULA Left 10/10/2019   Procedure: LIGATION OF LEFT ARM FISTULA;  Surgeon: [name], MD;  Location: [place] [facility];  Service: Vascular;  Laterality: Left;  . TUBAL LIGATION      There were no vitals filed for this visit.    Subjective Assessment - 05/05/20 0828    Subjective Patient presents to physical therapy with complaint of LBP. Also reports history of LT hip pain, but notes that her back has been bothering her more recently. Says her back pain began about 6 weeks ago, believes it to be related to her back. Reports history of arthritis. Had recent cortizone injection in LT hip 3 week ago. Manages symptoms with Tylenol extra strength. Normally uses a cane for ambulation, currently using RW.    Pertinent History LT hip OA    Limitations Standing;Walking;House hold activities    Patient Stated Goals Get so my back dont hurt so bad    Currently in Pain? Yes    Pain  Score 9     Pain Location Back    Pain Orientation Posterior;Lower    Pain Descriptors / Indicators Throbbing    Pain Onset 1 to 4 weeks ago    Pain Frequency Constant    Aggravating Factors  not sure, anything    Pain Relieving Factors best in the morning    Effect of Pain on Daily Activities Limits              [facility] PT Assessment - 05/05/20 0001      Assessment   Medical Diagnosis LBP, LT hip pain     Referring Provider (PT) Rod Can MD     Onset Date/Surgical Date 03/31/20    Prior Therapy Yes      Precautions   Precautions None      Restrictions   Weight Bearing Restrictions No      Balance Screen   Has the patient fallen in the past 6 months No        [place] residence    Living Arrangements Spouse/significant other      Prior Function   Level of Independence Independent with basic ADLs;Independent with household mobility with device   Says her children help with household activity      Cognition   Overall Cognitive Status Within Functional Limits for tasks assessed      Observation/Other Assessments   Focus on Therapeutic Outcomes (FOTO)  64% limited       Posture/Postural Control   Posture/Postural Control Postural limitations    Postural Limitations Rounded Shoulders;Forward head;Flexed trunk      Transfers   Five time sit to stand comments  14.2 seconds with heavy use of UEs      Ambulation/Gait   Ambulation/Gait Yes    Ambulation/Gait Assistance 5: Supervision    Ambulation Distance (Feet) 170 Feet    Assistive device Rolling walker    Gait Pattern Decreased stance time - left;Decreased step length - right;Decreased stride length;Antalgic;Trunk flexed    Ambulation Surface Level;Indoor    Gait Comments 2MWT                      Objective measurements completed on examination: See above findings.       [facility] Adult PT Treatment/Exercise - 05/05/20 0001      Exercises   Exercises Knee/Hip      Knee/Hip Exercises: Seated   Long Arc Quad Both;10 reps    Marching Both;10 reps                  PT Education - 05/05/20 [phone]    Education Details on evaluation findings, POC and HEP    Person(s) Educated Patient    Methods Explanation;Handout    Comprehension Verbalized understanding            PT Short Term Goals - 05/05/20 2800      PT SHORT TERM GOAL #1   Title Patient will be independent with initial HEP and self-management strategies to improve functional outcomes    Time 3    Period Weeks    Status New    Target Date 05/30/20             PT Long Term Goals - 05/05/20 0853      PT LONG TERM GOAL #1   Title Patient will improve FOTO  score by 10% to indicate improvement in functional outcomes    Time 6  Period Weeks    Status New    Target Date 06/20/20      PT LONG TERM GOAL #2   Title Patient will be able to perform stand x 5 in < 12 seconds to demonstrate improvement in functional mobility and reduced risk for falls.    Time 6    Period Weeks    Status New    Target Date 06/20/20      PT LONG TERM GOAL #3   Title Patient will report at least 70% overall improvement in subjective complaint to indicate improvement in ability to perform ADLs.    Time 6    Period Weeks    Status New    Target Date 06/20/20      PT LONG TERM GOAL #4   Title Patient will be able to ambulate at least 275 feet during 2MWT with LRAD to demonstrate improved ability to perform functional mobility and associated tasks.    Time 6    Period Weeks    Status New    Target Date 06/20/20                  Plan - 05/05/20 0849    Clinical Impression Statement Patient is a 74 y.o. female who presents to physical therapy with complaint of LBP and LT hip pain. Patient demonstrates decreased strength, reduced activity tolerance, postural deficits and gait abnormalities which are likely contributing to symptoms of pain and are negatively impacting patient ability to perform ADLs and functional mobility tasks. Patient will benefit from skilled physical therapy services to address these deficits to reduce pain, improve level of function with ADLs, functional mobility tasks, and reduce risk for falls.    Personal Factors and Comorbidities Comorbidity 2    Comorbidities HTN, DM    Examination-Activity Limitations Locomotion Level;Transfers;Stairs    Examination-Participation Restrictions Laundry;Community Activity;Cleaning    Stability/Clinical Decision Making Stable/Uncomplicated    Clinical Decision Making Low    Rehab Potential Fair    PT Frequency 2x / week    PT Duration 6 weeks    PT Treatment/Interventions ADLs/Self Care Home  Management;Aquatic Therapy;Biofeedback;Cryotherapy;Electrical Stimulation;Moist Heat;Traction;Iontophoresis 4mg /ml Dexamethasone;Therapeutic activities;Therapeutic exercise;Balance training;Manual lymph drainage;Manual techniques;Vasopneumatic Device;Taping;Splinting;Orthotic Fit/Training;Functional mobility training;Energy conservation;Dry needling;Gait training;Stair training;DME Instruction;Patient/family education;Passive range of motion;Spinal Manipulations;Joint Manipulations;Visual/perceptual remediation/compensation;Compression bandaging;Scar mobilization;Neuromuscular re-education;Cognitive remediation;Ultrasound;Parrafin;Contrast Bath;Fluidtherapy    PT Next Visit Plan Review goals, progress LE and core strength as tolerated. Begin with seated and or mat, progress to standing and balance when able.    PT Home Exercise Plan Eval: seated march, LAQ    Consulted and Agree with Plan of Care Patient           Patient will benefit from skilled therapeutic intervention in order to improve the following deficits and impairments:  Abnormal gait, Decreased endurance, Decreased activity tolerance, Decreased strength, Pain, Decreased mobility, Difficulty walking, Impaired perceived functional ability, Postural dysfunction  Visit Diagnosis: Low back pain, unspecified back pain laterality, unspecified chronicity, unspecified whether sciatica present  Pain in left hip  Other abnormalities of gait and mobility     Problem List There are no problems to display for this patient.  8:58 AM, 05/05/20 Josue Hector PT DPT  Physical Therapist with [facility]  [phone] [place] [address] [place], [place], [address] Phone: [phone]   Fax:  [phone]  Name: Beth Malone MRN: [id number] Date of Birth: [birth year]

## 2020-05-05 NOTE — Patient Instructions (Signed)
Access Code: [facility] URL: [email or web address] Date: 05/05/2020 Prepared by: Josue Hector  Exercises Seated March - 3 x daily - 7 x weekly - 2 sets - 10 reps Seated Long Arc Quad - 3 x daily - 7 x weekly - 2 sets - 10 reps

## 2020-05-12 ENCOUNTER — Other Ambulatory Visit: Payer: Self-pay

## 2020-05-12 ENCOUNTER — Encounter (HOSPITAL_COMMUNITY)
Admission: RE | Admit: 2020-05-12 | Discharge: 2020-05-12 | Disposition: A | Discharge status: Home / Self Care | Payer: Medicare HMO | Source: Ambulatory Visit | Attending: Internal Medicine | Admitting: Internal Medicine

## 2020-05-12 ENCOUNTER — Encounter (HOSPITAL_COMMUNITY): Payer: Self-pay

## 2020-05-12 DIAGNOSIS — D631 Anemia in chronic kidney disease: Secondary | ICD-10-CM | POA: Insufficient documentation

## 2020-05-12 DIAGNOSIS — N185 Chronic kidney disease, stage 5: Secondary | ICD-10-CM | POA: Diagnosis present

## 2020-05-12 LAB — RENAL FUNCTION PANEL
Albumin: 3.3 g/dL — ABNORMAL LOW (ref 3.5–5.0)
Anion gap: 8 (ref 5–15)
BUN: 24 mg/dL — ABNORMAL HIGH (ref 8–23)
CO2: 19 mmol/L — ABNORMAL LOW (ref 22–32)
Calcium: 8.7 mg/dL — ABNORMAL LOW (ref 8.9–10.3)
Chloride: 110 mmol/L (ref 98–111)
Creatinine, Ser: 1.76 mg/dL — ABNORMAL HIGH (ref 0.44–1.00)
GFR, Estimated: 30 mL/min — ABNORMAL LOW (ref >60)
Glucose, Bld: 137 mg/dL — ABNORMAL HIGH (ref 70–99)
Phosphorus: 4.8 mg/dL — ABNORMAL HIGH (ref 2.5–4.6)
Potassium: 4.5 mmol/L (ref 3.5–5.1)
Sodium: 137 mmol/L (ref 135–145)

## 2020-05-12 LAB — FERRITIN: Ferritin: 564 ng/mL — ABNORMAL HIGH (ref 11–307)

## 2020-05-12 LAB — IRON AND TIBC
Iron: 59 ug/dL (ref 28–170)
Saturation Ratios: 28 % (ref 10.4–31.8)
TIBC: 212 ug/dL — ABNORMAL LOW (ref 250–450)
UIBC: 153 ug/dL

## 2020-05-12 LAB — POCT HEMOGLOBIN-HEMACUE: Hemoglobin: 10.1 g/dL — ABNORMAL LOW (ref 12.0–15.0)

## 2020-05-12 MED ORDER — EPOETIN ALFA-EPBX 10000 UNIT/ML IJ SOLN
10000.0000 [IU] | Freq: Once | INTRAMUSCULAR | Status: AC
  Administered 2020-05-12: 10000 [IU] via SUBCUTANEOUS
  Filled 2020-05-12: qty 1

## 2020-05-13 ENCOUNTER — Encounter (HOSPITAL_COMMUNITY): Payer: Self-pay

## 2020-05-13 ENCOUNTER — Ambulatory Visit (HOSPITAL_COMMUNITY): Payer: Medicare HMO

## 2020-05-13 DIAGNOSIS — R2689 Other abnormalities of gait and mobility: Secondary | ICD-10-CM | POA: Diagnosis not present

## 2020-05-13 DIAGNOSIS — M545 Low back pain, unspecified: Secondary | ICD-10-CM | POA: Diagnosis not present

## 2020-05-13 DIAGNOSIS — M25552 Pain in left hip: Secondary | ICD-10-CM | POA: Diagnosis not present

## 2020-05-13 LAB — PARATHYROID HORMONE, INTACT (NO CA): PTH: 37 pg/mL (ref 15–65)

## 2020-05-13 NOTE — Patient Instructions (Signed)
Bridge    Lie back, legs bent. Inhale, pressing hips up. Keeping ribs in, lengthen lower back. Exhale, rolling down along spine from top. Repeat 10 times. Do 2 sessions per day.  [email or web address].[email or web address].[email or web address]   Copyright  VHI. All rights reserved.   

## 2020-05-13 NOTE — Therapy (Signed)
[place] Mal[place], [place], [address] Phone: [phone]   Fax:  [phone]  Physical Therapy Treatment  Patient Details  Name: Beth Malone MRN: [id number] Date of Birth: 1946-04-09 Referring Provider (PT): Rod Can MD    Encounter Date: 05/13/2020   PT End of Session - 05/13/20 1618    Visit Number 2    Number of Visits 12    Date for PT Re-Evaluation 06/20/20    Authorization Type [employer] Medicare (No auth, no VL)    PT Start Time 1612    PT Stop Time 1654    PT Time Calculation (min) 42 min    Activity Tolerance Patient limited by pain;Patient limited by fatigue;Patient tolerated treatment well    Behavior During Therapy [facility] for tasks assessed/performed           Past Medical History:  Diagnosis Date  . Anemia   . Anxiety   . Arthritis   . Chronic kidney disease    sees [facility]  . Diabetes mellitus    type II  . Dysphagia    last year or so   . Femur fracture ([place])   . GERD (gastroesophageal reflux disease)   . Heart murmur   . History of blood transfusion    tumor on tube and   . History of kidney stones    "stones in kidney"  . Hypertension     Past Surgical History:  Procedure Laterality Date  . ABDOMINAL HYSTERECTOMY     complete  . AV FISTULA PLACEMENT Left 10/03/2019   Procedure: LEFT ARM ARTERIOVENOUS (AV) FISTULA CREATION;  Surgeon: [name], MD;  Location: [place];  Service: Vascular;  Laterality: Left;  . BALLOON DILATION N/A 03/22/2016   Procedure: BALLOON DILATION;  Surgeon: [name], MD;  Location: [facility];  Service: Endoscopy;  Laterality: N/A;  . colonscopy  2014  . ESOPHAGEAL MANOMETRY N/A 04/05/2016   Procedure: ESOPHAGEAL MANOMETRY (EM);  Surgeon: [name], MD;  Location: [facility];  Service: Endoscopy;  Laterality: N/A;  . ESOPHAGOGASTRODUODENOSCOPY (EGD) WITH PROPOFOL N/A 03/22/2016   Procedure:  ESOPHAGOGASTRODUODENOSCOPY (EGD) WITH PROPOFOL;  Surgeon: [name], MD;  Location: [facility];  Service: Endoscopy;  Laterality: N/A;  . ESOPHAGOGASTRODUODENOSCOPY (EGD) WITH PROPOFOL N/A 06/01/2016   Procedure: ESOPHAGOGASTRODUODENOSCOPY (EGD) WITH PROPOFOL;  Surgeon: [name], MD;  Location: [facility];  Service: Endoscopy;  Laterality: N/A;  . FEMUR SURGERY Right 2011   rod placed  . LIGATION OF ARTERIOVENOUS  FISTULA Left 10/10/2019   Procedure: LIGATION OF LEFT ARM FISTULA;  Surgeon: [name], MD;  Location: [facility];  Service: Vascular;  Laterality: Left;  . TUBAL LIGATION      There were no vitals filed for this visit.   Subjective Assessment - 05/13/20 1615    Subjective Pt arrived ambulating with SPC, reports she is moving slow today and wonders if she should have brought RW.  Reports increased pain following wearing CAM boot Lt ankle for 2 weeks and increased LBP following.  Reports pressure helps with LBP.  Current pain 9/10 across lower back.    Patient Stated Goals Get so my back dont hurt so bad    Currently in Pain? Yes    Pain Score 9     Pain Location Back    Pain Orientation Lower    Pain Descriptors / Indicators Throbbing    Pain Type Chronic pain    Pain  Onset More than a month ago    Pain Frequency Constant    Aggravating Factors  not sure, anything    Pain Relieving Factors best in the morning, pressure/support    Effect of Pain on Daily Activities Limits                             [facility] Adult PT Treatment/Exercise - 05/13/20 0001      Bed Mobility   Bed Mobility Right Sidelying to Sit    Right Sidelying to Sit Supervision/Verbal cueing   Educated log rolling     Posture/Postural Control   Posture/Postural Control Postural limitations    Postural Limitations Rounded Shoulders;Forward head;Flexed trunk      Exercises   Exercises Lumbar      Lumbar Exercises: Stretches   Lower Trunk Rotation 5 reps;10 seconds     Other Lumbar Stretch Exercise Butterfly stretch 3x 30" Lt LE      Lumbar Exercises: Seated   Sit to Stand Limitations 3 reps, elevated height pain wiht movement    Other Seated Lumbar Exercises scapular retraction      Lumbar Exercises: Supine   Ab Set 5 reps;3 seconds    AB Set Limitations 2 sets, count to 3" outloud once activated    Bent Knee Raise 5 reps;3 seconds    Bent Knee Raise Limitations difficult with Lt LE    Bridge 10 reps;3 seconds    Other Supine Lumbar Exercises Decompression exercises 2-5; 5 reps 3" holds                  PT Education - 05/13/20 1627    Education Details Reviewed goals, educated importance of HEP compliance for maximal benefits, pt able to verbalize and demonstrate current HEP correctly without cueing.    Person(s) Educated Patient    Methods Explanation;Demonstration;Handout    Comprehension Verbalized understanding;Returned demonstration            PT Short Term Goals - 05/05/20 0852      PT SHORT TERM GOAL #1   Title Patient will be independent with initial HEP and self-management strategies to improve functional outcomes    Time 3    Period Weeks    Status New    Target Date 05/30/20             PT Long Term Goals - 05/05/20 0853      PT LONG TERM GOAL #1   Title Patient will improve FOTO score by 10% to indicate improvement in functional outcomes    Time 6    Period Weeks    Status New    Target Date 06/20/20      PT LONG TERM GOAL #2   Title Patient will be able to perform stand x 5 in < 12 seconds to demonstrate improvement in functional mobility and reduced risk for falls.    Time 6    Period Weeks    Status New    Target Date 06/20/20      PT LONG TERM GOAL #3   Title Patient will report at least 70% overall improvement in subjective complaint to indicate improvement in ability to perform ADLs.    Time 6    Period Weeks    Status New    Target Date 06/20/20      PT LONG TERM GOAL #4   Title Patient  will be able to ambulate at least 275 feet  during 2MWT with LRAD to demonstrate improved ability to perform functional mobility and associated tasks.    Time 6    Period Weeks    Status New    Target Date 06/20/20                 Plan - 05/13/20 1630    Clinical Impression Statement Reviewed goals, educated importance of HEP compliance, pt able to recall and demonstrate appropriate mechanics with current HEP.  Session focus on core and proximal strengthening as well as mobility for pain control.  Educated log rolling techniques to reduce stress on lumbar region.  Pt limited with Lt groin pain with bed mobility instructions.  Pt tolerated well with mat activities and added to HEP for core and proximal strengthening, print out given and verbalized understanding.  EOS pt still limited by pain.  Pt slow cadence with use of cane, encouraged to resume RW when high pain scale to reduce risk of fall with slow cadence.    Personal Factors and Comorbidities Comorbidity 2    Comorbidities HTN, DM    Examination-Activity Limitations Locomotion Level;Transfers;Stairs    Examination-Participation Restrictions Laundry;Community Activity;Cleaning    Stability/Clinical Decision Making Stable/Uncomplicated    Clinical Decision Making Low    Rehab Potential Fair    PT Frequency 2x / week    PT Duration 6 weeks    PT Treatment/Interventions ADLs/Self Care Home Management;Aquatic Therapy;Biofeedback;Cryotherapy;Electrical Stimulation;Moist Heat;Traction;Iontophoresis 4mg /ml Dexamethasone;Therapeutic activities;Therapeutic exercise;Balance training;Manual lymph drainage;Manual techniques;Vasopneumatic Device;Taping;Splinting;Orthotic Fit/Training;Functional mobility training;Energy conservation;Dry needling;Gait training;Stair training;DME Instruction;Patient/family education;Passive range of motion;Spinal Manipulations;Joint Manipulations;Visual/perceptual remediation/compensation;Compression bandaging;Scar  mobilization;Neuromuscular re-education;Cognitive remediation;Ultrasound;Parrafin;Contrast Bath;Fluidtherapy    PT Next Visit Plan Progress LE and core strength as tolerated. Begin with seated and or mat, progress to standing and balance when able.    PT Home Exercise Plan Eval: seated march, LAQ; 05/13/20: Decompression exercise and bridge.           Patient will benefit from skilled therapeutic intervention in order to improve the following deficits and impairments:  Abnormal gait,Decreased endurance,Decreased activity tolerance,Decreased strength,Pain,Decreased mobility,Difficulty walking,Impaired perceived functional ability,Postural dysfunction  Visit Diagnosis: Low back pain, unspecified back pain laterality, unspecified chronicity, unspecified whether sciatica present  Pain in left hip  Other abnormalities of gait and mobility     Problem List There are no problems to display for this patient.  Ihor Austin, LPTA/CLT; CBIS [phone]   Aldona Lento 05/13/2020, 6:36 PM  [facility] [address] [place], [place], [address] Phone: [phone]   Fax:  [phone]  Name: Noam Franzen MRN: [id number] Date of Birth: 1945-12-20

## 2020-05-15 ENCOUNTER — Other Ambulatory Visit: Payer: Self-pay

## 2020-05-15 ENCOUNTER — Ambulatory Visit (HOSPITAL_COMMUNITY): Payer: Medicare HMO

## 2020-05-15 ENCOUNTER — Encounter (HOSPITAL_COMMUNITY): Payer: Self-pay

## 2020-05-15 DIAGNOSIS — M545 Low back pain, unspecified: Secondary | ICD-10-CM

## 2020-05-15 DIAGNOSIS — R2689 Other abnormalities of gait and mobility: Secondary | ICD-10-CM

## 2020-05-15 DIAGNOSIS — M25552 Pain in left hip: Secondary | ICD-10-CM

## 2020-05-15 NOTE — Therapy (Signed)
[facility] [place], [place], [address] Phone: [phone]   Fax:  [phone]  Physical Therapy Treatment  Patient Details  Name: Beth Malone MRN: [id number] Date of Birth: [birth year] Referring Provider (PT): Rod Can MD    Encounter Date: 05/15/2020   PT End of Session - 05/15/20 1714    Visit Number 3    Number of Visits 12    Date for PT Re-Evaluation 06/20/20    Authorization Type [employer] Medicare (No auth, no VL)    PT Start Time 1703    PT Stop Time 1743    PT Time Calculation (min) 40 min    Activity Tolerance Patient limited by pain;Patient limited by fatigue;Patient tolerated treatment well    Behavior During Therapy [facility] for tasks assessed/performed           Past Medical History:  Diagnosis Date  . Anemia   . Anxiety   . Arthritis   . Chronic kidney disease    sees [facility]  . Diabetes mellitus    type II  . Dysphagia    last year or so   . Femur fracture ([place])   . GERD (gastroesophageal reflux disease)   . Heart murmur   . History of blood transfusion    tumor on tube and   . History of kidney stones    "stones in kidney"  . Hypertension     Past Surgical History:  Procedure Laterality Date  . ABDOMINAL HYSTERECTOMY     complete  . AV FISTULA PLACEMENT Left 10/03/2019   Procedure: LEFT ARM ARTERIOVENOUS (AV) FISTULA CREATION;  Surgeon: [name], MD;  Location: [facility];  Service: Vascular;  Laterality: Left;  . BALLOON DILATION N/A 03/22/2016   Procedure: BALLOON DILATION;  Surgeon: [name], MD;  Location: [facility];  Service: Endoscopy;  Laterality: N/A;  . colonscopy  2014  . ESOPHAGEAL MANOMETRY N/A 04/05/2016   Procedure: ESOPHAGEAL MANOMETRY (EM);  Surgeon: [name], MD;  [facility];  Service: Endoscopy;  Laterality: N/A;  . ESOPHAGOGASTRODUODENOSCOPY (EGD) WITH PROPOFOL N/A 03/22/2016   Procedure:  ESOPHAGOGASTRODUODENOSCOPY (EGD) WITH PROPOFOL;  Surgeon: [name], MD;  [facility];  Service: Endoscopy;  Laterality: N/A;  . ESOPHAGOGASTRODUODENOSCOPY (EGD) WITH PROPOFOL N/A 06/01/2016   Procedure: ESOPHAGOGASTRODUODENOSCOPY (EGD) WITH PROPOFOL;  Surgeon: [name], MD;  [facility];  Service: Endoscopy;  Laterality: N/A;  . FEMUR SURGERY Right 2011   rod placed  . LIGATION OF ARTERIOVENOUS  FISTULA Left 10/10/2019   Procedure: LIGATION OF LEFT ARM FISTULA;  Surgeon: [name], MD;  Location: [place];  Service: Vascular;  Laterality: Left;  . TUBAL LIGATION      There were no vitals filed for this visit.   Subjective Assessment - 05/15/20 1707    Subjective Pt arrived with RW, reports she feels more confident walking with and less pain.  Stated pain scale 9/10 LBP and Lt groin pain.  Reoprts she is sore following exercies.  Reports compliance with HEP, no questions concerning.    Pertinent History LT hip OA    Patient Stated Goals Get so my back dont hurt so bad    Currently in Pain? Yes    Pain Score 9     Pain Location Back    Pain Orientation Lower    Pain Descriptors / Indicators Sore    Pain Type Chronic pain    Pain Onset More  than a month ago    Pain Frequency Constant    Aggravating Factors  not sure, anything, walking, wbing and movement Lt LE.    Pain Relieving Factors best in the morning, pressure/support    Effect of Pain on Daily Activities Limits                             [facility] Adult PT Treatment/Exercise - 05/15/20 0001      Posture/Postural Control   Posture/Postural Control Postural limitations    Postural Limitations Rounded Shoulders;Forward head;Flexed trunk      Exercises   Exercises Lumbar      Lumbar Exercises: Stretches   Other Lumbar Stretch Exercise Butterfly stretch 3x 30" Lt LE      Lumbar Exercises: Seated   Other Seated Lumbar Exercises RTB row 20x      Lumbar Exercises: Supine    Ab Set 5 reps;3 seconds    AB Set Limitations 2 sets, count to 3" outloud once activated    Bent Knee Raise 10 reps;3 seconds    Bent Knee Raise Limitations difficult with Lt LE    Bridge 15 reps;3 seconds    Bridge Limitations partial range    Other Supine Lumbar Exercises Decompression exercises with RTB 5x 5"    Other Supine Lumbar Exercises pubic clearing iso hip add/abd 5x 5"                    PT Short Term Goals - 05/05/20 9470      PT SHORT TERM GOAL #1   Title Patient will be independent with initial HEP and self-management strategies to improve functional outcomes    Time 3    Period Weeks    Status New    Target Date 05/30/20             PT Long Term Goals - 05/05/20 0853      PT LONG TERM GOAL #1   Title Patient will improve FOTO score by 10% to indicate improvement in functional outcomes    Time 6    Period Weeks    Status New    Target Date 06/20/20      PT LONG TERM GOAL #2   Title Patient will be able to perform stand x 5 in < 12 seconds to demonstrate improvement in functional mobility and reduced risk for falls.    Time 6    Period Weeks    Status New    Target Date 06/20/20      PT LONG TERM GOAL #3   Title Patient will report at least 70% overall improvement in subjective complaint to indicate improvement in ability to perform ADLs.    Time 6    Period Weeks    Status New    Target Date 06/20/20      PT LONG TERM GOAL #4   Title Patient will be able to ambulate at least 275 feet during 2MWT with LRAD to demonstrate improved ability to perform functional mobility and associated tasks.    Time 6    Period Weeks    Status New    Target Date 06/20/20                 Plan - 05/15/20 1807    Clinical Impression Statement Pt limited with groin pain.  Attempted to assess SI alignment, no LLD noted.  Do feel there may be an outflare Lt  SI.  Did not attempt this session as pt demonstrates significant weakness.  Added pubic clearing,  reports of pain with isometric abduction.  Progressed decompression exercises with theraband resistance for postural strengthening.  Continued core and proximal strengthening wiht verbal and tactile cueing to improve core activation and breathing.    Personal Factors and Comorbidities Comorbidity 2    Comorbidities HTN, DM    Examination-Activity Limitations Locomotion Level;Transfers;Stairs    Examination-Participation Restrictions Laundry;Community Activity;Cleaning    Stability/Clinical Decision Making Stable/Uncomplicated    Clinical Decision Making Low    Rehab Potential Fair    PT Frequency 2x / week    PT Duration 6 weeks    PT Treatment/Interventions ADLs/Self Care Home Management;Aquatic Therapy;Biofeedback;Cryotherapy;Electrical Stimulation;Moist Heat;Traction;Iontophoresis 4mg /ml Dexamethasone;Therapeutic activities;Therapeutic exercise;Balance training;Manual lymph drainage;Manual techniques;Vasopneumatic Device;Taping;Splinting;Orthotic Fit/Training;Functional mobility training;Energy conservation;Dry needling;Gait training;Stair training;DME Instruction;Patient/family education;Passive range of motion;Spinal Manipulations;Joint Manipulations;Visual/perceptual remediation/compensation;Compression bandaging;Scar mobilization;Neuromuscular re-education;Cognitive remediation;Ultrasound;Parrafin;Contrast Bath;Fluidtherapy    PT Next Visit Plan Progress LE and core strength as tolerated. Begin with seated and or mat, progress to standing and balance when able.    PT Home Exercise Plan Eval: seated march, LAQ; 05/13/20: Decompression exercise and bridge.           Patient will benefit from skilled therapeutic intervention in order to improve the following deficits and impairments:  Abnormal gait,Decreased endurance,Decreased activity tolerance,Decreased strength,Pain,Decreased mobility,Difficulty walking,Impaired perceived functional ability,Postural dysfunction  Visit Diagnosis: Pain in  left hip  Other abnormalities of gait and mobility  Low back pain, unspecified back pain laterality, unspecified chronicity, unspecified whether sciatica present     Problem List There are no problems to display for this patient.  Ihor Austin, LPTA/CLT; CBIS [phone]  Aldona Lento 05/15/2020, 6:54 PM  [facility] [address] [place], [place], [address] Phone: [phone]   Fax:  [phone]  Name: Beth Malone MRN: [id number] Date of Birth: [birth year]

## 2020-05-19 DIAGNOSIS — E538 Deficiency of other specified B group vitamins: Secondary | ICD-10-CM | POA: Diagnosis not present

## 2020-05-19 DIAGNOSIS — L853 Xerosis cutis: Secondary | ICD-10-CM | POA: Diagnosis not present

## 2020-05-19 DIAGNOSIS — Z85828 Personal history of other malignant neoplasm of skin: Secondary | ICD-10-CM | POA: Diagnosis not present

## 2020-05-19 DIAGNOSIS — D2271 Melanocytic nevi of right lower limb, including hip: Secondary | ICD-10-CM | POA: Diagnosis not present

## 2020-05-19 DIAGNOSIS — D2272 Melanocytic nevi of left lower limb, including hip: Secondary | ICD-10-CM | POA: Diagnosis not present

## 2020-05-19 DIAGNOSIS — L821 Other seborrheic keratosis: Secondary | ICD-10-CM | POA: Diagnosis not present

## 2020-05-19 DIAGNOSIS — D225 Melanocytic nevi of trunk: Secondary | ICD-10-CM | POA: Diagnosis not present

## 2020-05-19 DIAGNOSIS — L814 Other melanin hyperpigmentation: Secondary | ICD-10-CM | POA: Diagnosis not present

## 2020-05-20 ENCOUNTER — Encounter (HOSPITAL_COMMUNITY): Payer: Self-pay | Admitting: Physical Therapy

## 2020-05-20 ENCOUNTER — Other Ambulatory Visit: Payer: Self-pay

## 2020-05-20 ENCOUNTER — Ambulatory Visit (HOSPITAL_COMMUNITY): Payer: Medicare HMO | Admitting: Physical Therapy

## 2020-05-20 DIAGNOSIS — M545 Low back pain, unspecified: Secondary | ICD-10-CM

## 2020-05-20 DIAGNOSIS — R2689 Other abnormalities of gait and mobility: Secondary | ICD-10-CM | POA: Diagnosis not present

## 2020-05-20 DIAGNOSIS — M25552 Pain in left hip: Secondary | ICD-10-CM | POA: Diagnosis not present

## 2020-05-20 NOTE — Therapy (Signed)
[facility] [address] [place], [place], [address] Phone: [phone]   Fax:  [phone]  Physical Therapy Treatment  Patient Details  Name: Beth Malone MRN: [id number] Date of Birth: March 02, 1946 Referring Provider (PT): Rod Can MD    Encounter Date: 05/20/2020   PT End of Session - 05/20/20 1312    Visit Number 4    Number of Visits 12    Date for PT Re-Evaluation 06/20/20    Authorization Type Aetna Medicare (No auth, no VL)    PT Start Time 1307    PT Stop Time 1345    PT Time Calculation (min) 38 min    Activity Tolerance Patient limited by fatigue;Patient tolerated treatment well    Behavior During Therapy [facility] for tasks assessed/performed           Past Medical History:  Diagnosis Date   Anemia    Anxiety    Arthritis    Chronic kidney disease    sees [facility]   Diabetes mellitus    type II   Dysphagia    last year or so    Femur fracture ([place])    GERD (gastroesophageal reflux disease)    Heart murmur    History of blood transfusion    tumor on tube and    History of kidney stones    "stones in kidney"   Hypertension     Past Surgical History:  Procedure Laterality Date   ABDOMINAL HYSTERECTOMY     complete   AV FISTULA PLACEMENT Left 10/03/2019   Procedure: LEFT ARM ARTERIOVENOUS (AV) FISTULA CREATION;  Surgeon: [name], MD;  Location: [place];  Service: Vascular;  Laterality: Left;   BALLOON DILATION N/A 03/22/2016   Procedure: BALLOON DILATION;  Surgeon: [name], MD;  [facility];  Service: Endoscopy;  Laterality: N/A;   colonscopy  2014   ESOPHAGEAL MANOMETRY N/A 04/05/2016   Procedure: ESOPHAGEAL MANOMETRY (EM);  Surgeon: [name], MD;  [facility];  Service: Endoscopy;  Laterality: N/A;   ESOPHAGOGASTRODUODENOSCOPY (EGD) WITH PROPOFOL N/A 03/22/2016   Procedure: ESOPHAGOGASTRODUODENOSCOPY (EGD) WITH PROPOFOL;   Surgeon: [name], MD;  [facility];  Service: Endoscopy;  Laterality: N/A;   ESOPHAGOGASTRODUODENOSCOPY (EGD) WITH PROPOFOL N/A 06/01/2016   Procedure: ESOPHAGOGASTRODUODENOSCOPY (EGD) WITH PROPOFOL;  Surgeon: [name], MD;  [facility];  Service: Endoscopy;  Laterality: N/A;   FEMUR SURGERY Right 2011   rod placed   LIGATION OF ARTERIOVENOUS  FISTULA Left 10/10/2019   Procedure: LIGATION OF LEFT ARM FISTULA;  Surgeon: [name], MD;  [facility];  Service: Vascular;  Laterality: Left;   TUBAL LIGATION      There were no vitals filed for this visit.   Subjective Assessment - 05/20/20 1311    Subjective Patient says she has been doing HEP. Says she is doing well with these and has started doing them 4 x daily. She says her RT side is better, but her LT side is still rough.    Pertinent History LT hip OA    Patient Stated Goals Get so my back dont hurt so bad    Currently in Pain? Yes    Pain Score 7     Pain Location Back    Pain Orientation Left;Lower    Pain Descriptors / Indicators Sore    Pain Type Chronic pain    Pain Onset More than a month ago  Pain Frequency Constant                             [facility] Adult PT Treatment/Exercise - 05/20/20 0001      Lumbar Exercises: Standing   Heel Raises 20 reps   2 x 10   Other Standing Lumbar Exercises sidestepping at table 4RT      Lumbar Exercises: Seated   Sit to Stand 15 reps   elevated surface using low mat     Lumbar Exercises: Supine   Ab Set 10 reps;5 seconds    Heel Slides 10 reps    Bent Knee Raise 10 reps    Bridge 10 reps;5 seconds    Other Supine Lumbar Exercises supine clamshell 10 x 5", isometric hip abduction 10 x 5"                    PT Short Term Goals - 05/05/20 6269      PT SHORT TERM GOAL #1   Title Patient will be independent with initial HEP and self-management strategies to improve functional outcomes    Time 3     Period Weeks    Status New    Target Date 05/30/20             PT Long Term Goals - 05/05/20 0853      PT LONG TERM GOAL #1   Title Patient will improve FOTO score by 10% to indicate improvement in functional outcomes    Time 6    Period Weeks    Status New    Target Date 06/20/20      PT LONG TERM GOAL #2   Title Patient will be able to perform stand x 5 in < 12 seconds to demonstrate improvement in functional mobility and reduced risk for falls.    Time 6    Period Weeks    Status New    Target Date 06/20/20      PT LONG TERM GOAL #3   Title Patient will report at least 70% overall improvement in subjective complaint to indicate improvement in ability to perform ADLs.    Time 6    Period Weeks    Status New    Target Date 06/20/20      PT LONG TERM GOAL #4   Title Patient will be able to ambulate at least 275 feet during 2MWT with LRAD to demonstrate improved ability to perform functional mobility and associated tasks.    Time 6    Period Weeks    Status New    Target Date 06/20/20                 Plan - 05/20/20 1344    Clinical Impression Statement Patient tolerated session well today and shows improved return with HEP exercises. Added band resistance to supine clamshell for hip strength progression. Added heels slides for hip flexor activation and to reduce pain prior to performing bent knee lifts. Patient progressed to standing strengthening today with added sit to stands, heel raises and supported sidestepping. Patient required elevated surface for sit to stands due to ongoing hip weakness. Added standing heel raises to HEP. Patient will continue to benefit from skilled therapy services to progress hip and core strength to reduce pain and improve functional mobility.    Personal Factors and Comorbidities Comorbidity 2    Comorbidities HTN, DM    Examination-Activity Limitations Locomotion Level;Transfers;Stairs  Examination-Participation Restrictions  Laundry;Community Activity;Cleaning    Stability/Clinical Decision Making Stable/Uncomplicated    Rehab Potential Fair    PT Frequency 2x / week    PT Duration 6 weeks    PT Treatment/Interventions ADLs/Self Care Home Management;Aquatic Therapy;Biofeedback;Cryotherapy;Electrical Stimulation;Moist Heat;Traction;Iontophoresis 4mg /ml Dexamethasone;Therapeutic activities;Therapeutic exercise;Balance training;Manual lymph drainage;Manual techniques;Vasopneumatic Device;Taping;Splinting;Orthotic Fit/Training;Functional mobility training;Energy conservation;Dry needling;Gait training;Stair training;DME Instruction;Patient/family education;Passive range of motion;Spinal Manipulations;Joint Manipulations;Visual/perceptual remediation/compensation;Compression bandaging;Scar mobilization;Neuromuscular re-education;Cognitive remediation;Ultrasound;Parrafin;Contrast Bath;Fluidtherapy    PT Next Visit Plan Progress LE and core strength as tolerated. Conitnue with standing exercise and add balance when able.    PT Home Exercise Plan Eval: seated march, LAQ; 05/13/20: Decompression exercise and bridge. 05/20/20: heel raise           Patient will benefit from skilled therapeutic intervention in order to improve the following deficits and impairments:  Abnormal gait,Decreased endurance,Decreased activity tolerance,Decreased strength,Pain,Decreased mobility,Difficulty walking,Impaired perceived functional ability,Postural dysfunction  Visit Diagnosis: Pain in left hip  Other abnormalities of gait and mobility  Low back pain, unspecified back pain laterality, unspecified chronicity, unspecified whether sciatica present     Problem List There are no problems to display for this patient.   1:47 PM, 05/20/20 Josue Hector PT DPT  Physical Therapist with [facility]  [phone] [place] [address] [place], [place],  [address] Phone: [phone]   Fax:  [phone]  Name: Beth Malone MRN: [id number] Date of Birth: 1945/08/25

## 2020-05-20 NOTE — Patient Instructions (Signed)
Access Code: [email or web address] URL: [email or web address] Date: 05/20/2020 Prepared by: Josue Hector  Exercises Heel rises with counter support - 3 x daily - 7 x weekly - 2 sets - 10 reps

## 2020-05-21 DIAGNOSIS — M5136 Other intervertebral disc degeneration, lumbar region: Secondary | ICD-10-CM | POA: Diagnosis not present

## 2020-05-21 DIAGNOSIS — M47816 Spondylosis without myelopathy or radiculopathy, lumbar region: Secondary | ICD-10-CM | POA: Diagnosis not present

## 2020-05-22 ENCOUNTER — Ambulatory Visit (HOSPITAL_COMMUNITY): Payer: Medicare HMO | Admitting: Physical Therapy

## 2020-05-22 ENCOUNTER — Other Ambulatory Visit: Payer: Self-pay

## 2020-05-22 ENCOUNTER — Encounter (HOSPITAL_COMMUNITY): Payer: Self-pay | Admitting: Physical Therapy

## 2020-05-22 DIAGNOSIS — R2689 Other abnormalities of gait and mobility: Secondary | ICD-10-CM

## 2020-05-22 DIAGNOSIS — M545 Low back pain, unspecified: Secondary | ICD-10-CM | POA: Diagnosis not present

## 2020-05-22 DIAGNOSIS — M25552 Pain in left hip: Secondary | ICD-10-CM

## 2020-05-22 NOTE — Therapy (Signed)
[facility] [place], [place], [address] Phone: [phone]   Fax:  [phone]  Physical Therapy Treatment  Patient Details  Name: Beth Malone MRN: [id number] Date of Birth: [birth year] Referring Provider (PT): Rod Can MD    Encounter Date: 05/22/2020   PT End of Session - 05/22/20 1128    Visit Number 5    Number of Visits 12    Date for PT Re-Evaluation 06/20/20    Authorization Type Aetna Medicare (No auth, no VL)    PT Start Time 1130    PT Stop Time 1210    PT Time Calculation (min) 40 min    Activity Tolerance Patient limited by fatigue;Patient tolerated treatment well    Behavior During Therapy [facility] for tasks assessed/performed           Past Medical History:  Diagnosis Date  . Anemia   . Anxiety   . Arthritis   . Chronic kidney disease    sees [facility]  . Diabetes mellitus    type II  . Dysphagia    last year or so   . Femur fracture ([place])   . GERD (gastroesophageal reflux disease)   . Heart murmur   . History of blood transfusion    tumor on tube and   . History of kidney stones    "stones in kidney"  . Hypertension     Past Surgical History:  Procedure Laterality Date  . ABDOMINAL HYSTERECTOMY     complete  . AV FISTULA PLACEMENT Left 10/03/2019   Procedure: LEFT ARM ARTERIOVENOUS (AV) FISTULA CREATION;  Surgeon: [name], MD;  Location: [place];  Service: Vascular;  Laterality: Left;  . BALLOON DILATION N/A 03/22/2016   Procedure: BALLOON DILATION;  Surgeon: [name], MD;  Location: [facility];  Service: Endoscopy;  Laterality: N/A;  . colonscopy  2014  . ESOPHAGEAL MANOMETRY N/A 04/05/2016   Procedure: ESOPHAGEAL MANOMETRY (EM);  Surgeon: [name], MD;  [facility];  Service: Endoscopy;  Laterality: N/A;  . ESOPHAGOGASTRODUODENOSCOPY (EGD) WITH PROPOFOL N/A 03/22/2016   Procedure: ESOPHAGOGASTRODUODENOSCOPY (EGD) WITH PROPOFOL;   Surgeon: [name], MD;  [facility];  Service: Endoscopy;  Laterality: N/A;  . ESOPHAGOGASTRODUODENOSCOPY (EGD) WITH PROPOFOL N/A 06/01/2016   Procedure: ESOPHAGOGASTRODUODENOSCOPY (EGD) WITH PROPOFOL;  Surgeon: [name], MD;  [facility];  Service: Endoscopy;  Laterality: N/A;  . FEMUR SURGERY Right 2011   rod placed  . LIGATION OF ARTERIOVENOUS  FISTULA Left 10/10/2019   Procedure: LIGATION OF LEFT ARM FISTULA;  Surgeon: [name], MD;  Location: [place];  Service: Vascular;  Laterality: Left;  . TUBAL LIGATION      There were no vitals filed for this visit.   Subjective Assessment - 05/22/20 1128    Subjective Patient reports increased low back soreness the past two days.  No injury or specific mechanism identified and patient attributes this to increased activity around her house.  Patient rates pain at 5/10 at start of session and is using RW for ambulation    Pertinent History LT hip OA    Patient Stated Goals Get so my back dont hurt so bad    Currently in Pain? Yes    Pain Score 5     Pain Location Back    Pain Orientation Lower    Pain Descriptors / Indicators Sore    Pain Type Chronic pain    Pain Onset  More than a month ago              [facility] PT Assessment - 05/22/20 0001      Assessment   Medical Diagnosis LBP, LT hip pain     Referring Provider (PT) Rod Can MD                          [facility] Adult PT Treatment/Exercise - 05/22/20 0001      Lumbar Exercises: Standing   Heel Raises 20 reps    Other Standing Lumbar Exercises sidestepping in parallel bars with green t-loop 2x2 min      Lumbar Exercises: Seated   Long Arc Quad on Chair Strengthening;Both;3 sets;10 reps   5 lbs     Lumbar Exercises: Supine   Other Supine Lumbar Exercises supine clamshell 2x10 x 5", with green t-loop      Lumbar Exercises: Sidelying   Clam 10 reps;2 seconds   green t-loop     Knee/Hip Exercises: Aerobic   Nustep level  1 x 6 min for warm-up.   seat level 8, arms level 8                 PT Education - 05/22/20 1155    Education Details Review of HEP and addition of green T-loop for resistance. Patient shown lumbar roll for use in sitting    Person(s) Educated Patient    Methods Explanation    Comprehension Verbalized understanding;Returned demonstration            PT Short Term Goals - 05/05/20 0852      PT SHORT TERM GOAL #1   Title Patient will be independent with initial HEP and self-management strategies to improve functional outcomes    Time 3    Period Weeks    Status New    Target Date 05/30/20             PT Long Term Goals - 05/05/20 0853      PT LONG TERM GOAL #1   Title Patient will improve FOTO score by 10% to indicate improvement in functional outcomes    Time 6    Period Weeks    Status New    Target Date 06/20/20      PT LONG TERM GOAL #2   Title Patient will be able to perform stand x 5 in < 12 seconds to demonstrate improvement in functional mobility and reduced risk for falls.    Time 6    Period Weeks    Status New    Target Date 06/20/20      PT LONG TERM GOAL #3   Title Patient will report at least 70% overall improvement in subjective complaint to indicate improvement in ability to perform ADLs.    Time 6    Period Weeks    Status New    Target Date 06/20/20      PT LONG TERM GOAL #4   Title Patient will be able to ambulate at least 275 feet during 2MWT with LRAD to demonstrate improved ability to perform functional mobility and associated tasks.    Time 6    Period Weeks    Status New    Target Date 06/20/20                 Plan - 05/22/20 1156    Clinical Impression Statement Patient tolerating exercise progression well and able to initiate use of resistance today  for hip strengthening to improve strength and activity tolerance. Patient tolerating increased activity without increasing pain and reports 4/10 pain at end of session vs  5/10 at start of session.  Patient would benefit from continued HEP development to progress hip/core strengthening to carryover to dynamic activities and functional lifts with good body mechanics    Personal Factors and Comorbidities Comorbidity 2    Comorbidities HTN, DM    Examination-Activity Limitations Locomotion Level;Transfers;Stairs    Examination-Participation Restrictions Laundry;Community Activity;Cleaning    Stability/Clinical Decision Making Stable/Uncomplicated    Rehab Potential Fair    PT Frequency 2x / week    PT Duration 6 weeks    PT Treatment/Interventions ADLs/Self Care Home Management;Aquatic Therapy;Biofeedback;Cryotherapy;Electrical Stimulation;Moist Heat;Traction;Iontophoresis 4mg /ml Dexamethasone;Therapeutic activities;Therapeutic exercise;Balance training;Manual lymph drainage;Manual techniques;Vasopneumatic Device;Taping;Splinting;Orthotic Fit/Training;Functional mobility training;Energy conservation;Dry needling;Gait training;Stair training;DME Instruction;Patient/family education;Passive range of motion;Spinal Manipulations;Joint Manipulations;Visual/perceptual remediation/compensation;Compression bandaging;Scar mobilization;Neuromuscular re-education;Cognitive remediation;Ultrasound;Parrafin;Contrast Bath;Fluidtherapy    PT Next Visit Plan Progress LE and core strength as tolerated. Conitnue with standing exercise and add balance when able.    PT Home Exercise Plan Eval: seated march, LAQ; 05/13/20: Decompression exercise and bridge. 05/20/20: heel raise. 12/23 added side stepping and sidelying clam shells with green t-loop           Patient will benefit from skilled therapeutic intervention in order to improve the following deficits and impairments:  Abnormal gait,Decreased endurance,Decreased activity tolerance,Decreased strength,Pain,Decreased mobility,Difficulty walking,Impaired perceived functional ability,Postural dysfunction  Visit Diagnosis: Pain in left  hip  Other abnormalities of gait and mobility  Low back pain, unspecified back pain laterality, unspecified chronicity, unspecified whether sciatica present     Problem List There are no problems to display for this patient.    12:08 PM, 05/22/20 M. Sherlyn Lees, PT, DPT Physical Therapist- [place] Office Number: [phone]  [facility] [address] [place], [place], [address] Phone: [phone]   Fax:  [phone]  Name: Beth Malone MRN: [id number] Date of Birth: 24-Feb-1946

## 2020-05-27 ENCOUNTER — Ambulatory Visit (HOSPITAL_COMMUNITY): Payer: Medicare HMO | Admitting: Physical Therapy

## 2020-05-27 ENCOUNTER — Other Ambulatory Visit: Payer: Self-pay

## 2020-05-27 ENCOUNTER — Encounter (HOSPITAL_COMMUNITY): Payer: Self-pay | Admitting: Physical Therapy

## 2020-05-27 DIAGNOSIS — R2689 Other abnormalities of gait and mobility: Secondary | ICD-10-CM | POA: Diagnosis not present

## 2020-05-27 DIAGNOSIS — M545 Low back pain, unspecified: Secondary | ICD-10-CM

## 2020-05-27 DIAGNOSIS — M25552 Pain in left hip: Secondary | ICD-10-CM

## 2020-05-27 NOTE — Therapy (Signed)
[facility] [address] [place], [place], [address] Phone: [phone]   Fax:  [phone]  Physical Therapy Treatment  Patient Details  Name: Beth Malone MRN: [id number] Date of Birth: [birth year] Referring Provider (PT): Rod Can MD    Encounter Date: 05/27/2020   PT End of Session - 05/27/20 5631    Visit Number 6    Number of Visits 12    Date for PT Re-Evaluation 06/20/20    Authorization Type Aetna Medicare (No auth, no VL)    PT Start Time 4970    PT Stop Time 1355    PT Time Calculation (min) 38 min    Activity Tolerance Patient limited by fatigue;Patient tolerated treatment well    Behavior During Therapy [facility] for tasks assessed/performed           Past Medical History:  Diagnosis Date   Anemia    Anxiety    Arthritis    Chronic kidney disease    sees [facility]   Diabetes mellitus    type II   Dysphagia    last year or so    Femur fracture ([facility])    GERD (gastroesophageal reflux disease)    Heart murmur    History of blood transfusion    tumor on tube and    History of kidney stones    "stones in kidney"   Hypertension     Past Surgical History:  Procedure Laterality Date   ABDOMINAL HYSTERECTOMY     complete   AV FISTULA PLACEMENT Left 10/03/2019   Procedure: LEFT ARM ARTERIOVENOUS (AV) FISTULA CREATION;  Surgeon: [name], MD;  Location: [place];  Service: Vascular;  Laterality: Left;   BALLOON DILATION N/A 03/22/2016   Procedure: BALLOON DILATION;  Surgeon: [name], MD;  [facility];  Service: Endoscopy;  Laterality: N/A;   colonscopy  2014   ESOPHAGEAL MANOMETRY N/A 04/05/2016   Procedure: ESOPHAGEAL MANOMETRY (EM);  Surgeon: [name], MD;  [facility];  Service: Endoscopy;  Laterality: N/A;   ESOPHAGOGASTRODUODENOSCOPY (EGD) WITH PROPOFOL N/A 03/22/2016   Procedure: ESOPHAGOGASTRODUODENOSCOPY (EGD) WITH PROPOFOL;   Surgeon: [name], MD;  [facility];  Service: Endoscopy;  Laterality: N/A;   ESOPHAGOGASTRODUODENOSCOPY (EGD) WITH PROPOFOL N/A 06/01/2016   Procedure: ESOPHAGOGASTRODUODENOSCOPY (EGD) WITH PROPOFOL;  Surgeon: [name], MD;  [facility];  Service: Endoscopy;  Laterality: N/A;   FEMUR SURGERY Right 2011   rod placed   LIGATION OF ARTERIOVENOUS  FISTULA Left 10/10/2019   Procedure: LIGATION OF LEFT ARM FISTULA;  Surgeon: [name], MD;  [facility];  Service: Vascular;  Laterality: Left;   TUBAL LIGATION      There were no vitals filed for this visit.   Subjective Assessment - 05/27/20 1321    Subjective Patient reports increased low back soreness the past two days.  No injury or specific mechanism identified and patient attributes this to increased activity around her house.  Patient rates pain at 5/10 at start of session and is using RW for ambulation    Pertinent History LT hip OA    Patient Stated Goals Get so my back dont hurt so bad    Currently in Pain? Yes    Pain Score 5     Pain Location Back    Pain Orientation Medial;Lower    Pain Onset More than a month ago  [facility] PT Assessment - 05/27/20 0001      Assessment   Medical Diagnosis LBP, LT hip pain     Referring Provider (PT) Rod Can MD                          [facility] Adult PT Treatment/Exercise - 05/27/20 0001      Lumbar Exercises: Aerobic   Stationary Bike 5 minutes with no resistance      Lumbar Exercises: Seated   Other Seated Lumbar Exercises self mobilization with rolling stick - 8 minutes bilateral lower extremities      Knee/Hip Exercises: Standing   Hip Flexion AROM;Both;3 sets;10 reps;Knee straight    Hip Abduction AROM;Both;3 sets;10 reps    Hip Extension AROM;Both;3 sets;10 reps;Knee straight   UE support     Knee/Hip Exercises: Seated   Sit to Sand without UE support;20 reps   with blue cushion                  PT Education - 05/27/20 1336    Education Details educated in lumbar support with towel, measured patient for compression garments, on aerobic activity and self massage    Person(s) Educated Patient    Methods Explanation    Comprehension Verbalized understanding            PT Short Term Goals - 05/05/20 9798      PT SHORT TERM GOAL #1   Title Patient will be independent with initial HEP and self-management strategies to improve functional outcomes    Time 3    Period Weeks    Status New    Target Date 05/30/20             PT Long Term Goals - 05/05/20 0853      PT LONG TERM GOAL #1   Title Patient will improve FOTO score by 10% to indicate improvement in functional outcomes    Time 6    Period Weeks    Status New    Target Date 06/20/20      PT LONG TERM GOAL #2   Title Patient will be able to perform stand x 5 in < 12 seconds to demonstrate improvement in functional mobility and reduced risk for falls.    Time 6    Period Weeks    Status New    Target Date 06/20/20      PT LONG TERM GOAL #3   Title Patient will report at least 70% overall improvement in subjective complaint to indicate improvement in ability to perform ADLs.    Time 6    Period Weeks    Status New    Target Date 06/20/20      PT LONG TERM GOAL #4   Title Patient will be able to ambulate at least 275 feet during 2MWT with LRAD to demonstrate improved ability to perform functional mobility and associated tasks.    Time 6    Period Weeks    Status New    Target Date 06/20/20                 Plan - 05/27/20 1358    Clinical Impression Statement Patient really sore at start of session but this was reduced with bike and self massage. Measured patient for compression garments and provided patient with paper of measurements to obtain stockings. Fatigue with all exercises but no increase in pain symptoms. Will continue with strengthening as tolerated.  Personal Factors  and Comorbidities Comorbidity 2    Comorbidities HTN, DM    Examination-Activity Limitations Locomotion Level;Transfers;Stairs    Examination-Participation Restrictions Laundry;Community Activity;Cleaning    Stability/Clinical Decision Making Stable/Uncomplicated    Rehab Potential Fair    PT Frequency 2x / week    PT Duration 6 weeks    PT Treatment/Interventions ADLs/Self Care Home Management;Aquatic Therapy;Biofeedback;Cryotherapy;Electrical Stimulation;Moist Heat;Traction;Iontophoresis 4mg /ml Dexamethasone;Therapeutic activities;Therapeutic exercise;Balance training;Manual lymph drainage;Manual techniques;Vasopneumatic Device;Taping;Splinting;Orthotic Fit/Training;Functional mobility training;Energy conservation;Dry needling;Gait training;Stair training;DME Instruction;Patient/family education;Passive range of motion;Spinal Manipulations;Joint Manipulations;Visual/perceptual remediation/compensation;Compression bandaging;Scar mobilization;Neuromuscular re-education;Cognitive remediation;Ultrasound;Parrafin;Contrast Bath;Fluidtherapy    PT Next Visit Plan Progress LE and core strength as tolerated. Conitnue with standing exercise and add balance when able.    PT Home Exercise Plan Eval: seated march, LAQ; 05/13/20: Decompression exercise and bridge. 05/20/20: heel raise. 12/23 added side stepping and sidelying clam shells with green t-loop; 12/28 self massage with rolling pin           Patient will benefit from skilled therapeutic intervention in order to improve the following deficits and impairments:  Abnormal gait,Decreased endurance,Decreased activity tolerance,Decreased strength,Pain,Decreased mobility,Difficulty walking,Impaired perceived functional ability,Postural dysfunction  Visit Diagnosis: Pain in left hip  Other abnormalities of gait and mobility  Low back pain, unspecified back pain laterality, unspecified chronicity, unspecified whether sciatica present     Problem  List There are no problems to display for this patient.  1:59 PM, 05/27/20 Jerene Pitch, DPT Physical Therapy with [facility]  [phone] office  [place] [address] [place] [address] [place], [place], [address] Phone: [phone]   Fax:  [phone]  Name: Beth Malone MRN: [id number] Date of Birth: 1945-09-12

## 2020-05-29 ENCOUNTER — Other Ambulatory Visit: Payer: Self-pay

## 2020-05-29 ENCOUNTER — Ambulatory Visit (HOSPITAL_COMMUNITY): Payer: Medicare HMO | Admitting: Physical Therapy

## 2020-05-29 DIAGNOSIS — M545 Low back pain, unspecified: Secondary | ICD-10-CM

## 2020-05-29 DIAGNOSIS — R2689 Other abnormalities of gait and mobility: Secondary | ICD-10-CM

## 2020-05-29 DIAGNOSIS — M25552 Pain in left hip: Secondary | ICD-10-CM | POA: Diagnosis not present

## 2020-05-29 NOTE — Therapy (Signed)
[place], [place], [address] Phone: [phone]   Fax:  [phone]  Physical Therapy Treatment  Patient Details  Name: Beth Malone MRN: [id number] Date of Birth: 12/28/1945 Referring Provider (PT): Rod Can MD    Encounter Date: 05/29/2020   PT End of Session - 05/29/20 1354    Visit Number 7    Number of Visits 12    Date for PT Re-Evaluation 06/20/20    Authorization Type [employer] Medicare (No auth, no VL)    PT Start Time 1346    PT Stop Time 1428    PT Time Calculation (min) 42 min    Activity Tolerance Patient limited by fatigue;Patient tolerated treatment well    Behavior During Therapy [facility] for tasks assessed/performed           Past Medical History:  Diagnosis Date  . Anemia   . Anxiety   . Arthritis   . Chronic kidney disease    sees [facility]  . Diabetes mellitus    type II  . Dysphagia    last year or so   . Femur fracture ([place])   . GERD (gastroesophageal reflux disease)   . Heart murmur   . History of blood transfusion    tumor on tube and   . History of kidney stones    "stones in kidney"  . Hypertension     Past Surgical History:  Procedure Laterality Date  . ABDOMINAL HYSTERECTOMY     complete  . AV FISTULA PLACEMENT Left 10/03/2019   Procedure: LEFT ARM ARTERIOVENOUS (AV) FISTULA CREATION;  Surgeon: [name], MD;  Location: [facility];  Service: Vascular;  Laterality: Left;  . BALLOON DILATION N/A 03/22/2016   Procedure: BALLOON DILATION;  Surgeon: [name], MD;  Location: [facility];  Service: Endoscopy;  Laterality: N/A;  . colonscopy  2014  . ESOPHAGEAL MANOMETRY N/A 04/05/2016   Procedure: ESOPHAGEAL MANOMETRY (EM);  Surgeon: [name], MD;  Location: [facility];  Service: Endoscopy;  Laterality: N/A;  . ESOPHAGOGASTRODUODENOSCOPY (EGD) WITH PROPOFOL N/A 03/22/2016   Procedure: ESOPHAGOGASTRODUODENOSCOPY (EGD) WITH PROPOFOL;   Surgeon: [name], MD;  [facility]: [facility];  Service: Endoscopy;  Laterality: N/A;  . ESOPHAGOGASTRODUODENOSCOPY (EGD) WITH PROPOFOL N/A 06/01/2016   Procedure: ESOPHAGOGASTRODUODENOSCOPY (EGD) WITH PROPOFOL;  Surgeon: [name], MD;  [facility]: [facility];  Service: Endoscopy;  Laterality: N/A;  . FEMUR SURGERY Right 2011   rod placed  . LIGATION OF ARTERIOVENOUS  FISTULA Left 10/10/2019   Procedure: LIGATION OF LEFT ARM FISTULA;  Surgeon: [name], MD;  Location: [facility];  Service: Vascular;  Laterality: Left;  . TUBAL LIGATION      There were no vitals filed for this visit.   Subjective Assessment - 05/29/20 1353    Subjective Patient says she is handling the exercises well. Says she has been compliant with HEP. Says her LT hip still hurts, her RT hip is doing much better.    Pertinent History LT hip OA    Patient Stated Goals Get so my back dont hurt so bad    Currently in Pain? Yes    Pain Score 4     Pain Location Hip    Pain Orientation Left;Posterior    Pain Descriptors / Indicators Sore    Pain Type Chronic pain    Pain Onset More than a month ago  [place] Adult PT Treatment/Exercise - 05/29/20 0001      Knee/Hip Exercises: Standing   Heel Raises Both;2 sets;10 reps    Hip Abduction Both;2 sets;10 reps    Hip Extension Both;2 sets;10 reps    Forward Step Up Both;1 set;10 reps;Hand Hold: 2;Step Height: 4"    Gait Training 200 feet with SPC, cues for sequencing    Other Standing Knee Exercises sidestepping 2 RT no AD    Other Standing Knee Exercises tandem stance 3 x 20" on solid floor      Knee/Hip Exercises: Seated   Sit to Sand 2 sets;10 reps;without UE support   blue foam pad elevation                   PT Short Term Goals - 05/05/20 2440      PT SHORT TERM GOAL #1   Title Patient will be independent with initial HEP and self-management strategies to improve functional outcomes    Time 3     Period Weeks    Status New    Target Date 05/30/20             PT Long Term Goals - 05/05/20 0853      PT LONG TERM GOAL #1   Title Patient will improve FOTO score by 10% to indicate improvement in functional outcomes    Time 6    Period Weeks    Status New    Target Date 06/20/20      PT LONG TERM GOAL #2   Title Patient will be able to perform stand x 5 in < 12 seconds to demonstrate improvement in functional mobility and reduced risk for falls.    Time 6    Period Weeks    Status New    Target Date 06/20/20      PT LONG TERM GOAL #3   Title Patient will report at least 70% overall improvement in subjective complaint to indicate improvement in ability to perform ADLs.    Time 6    Period Weeks    Status New    Target Date 06/20/20      PT LONG TERM GOAL #4   Title Patient will be able to ambulate at least 275 feet during 2MWT with LRAD to demonstrate improved ability to perform functional mobility and associated tasks.    Time 6    Period Weeks    Status New    Target Date 06/20/20                 Plan - 05/29/20 1425    Clinical Impression Statement Patient tolerated session well overall today. Patient continues to have mild pain in LT knee with stair ambulation, but shows improved activity tolerance. Patient required verbal cues to avoid knee valgus with sit to stands from elevated surface. Patient did well with added tandem stance for static balance, and was able to progress to [facility] for gait training. Patient required minimal cueing for proper sequencing. Patient will continue to benefit from skilled therapy services to progress LE strength and balance to reduce pain and improve functional mobility.    Personal Factors and Comorbidities Comorbidity 2    Comorbidities HTN, DM    Examination-Activity Limitations Locomotion Level;Transfers;Stairs    Examination-Participation Restrictions Laundry;Community Activity;Cleaning    Stability/Clinical Decision  Making Stable/Uncomplicated    Rehab Potential Fair    PT Frequency 2x / week    PT Duration 6 weeks    PT Treatment/Interventions  ADLs/Self Care Home Management;Aquatic Therapy;Biofeedback;Cryotherapy;Electrical Stimulation;Moist Heat;Traction;Iontophoresis 4mg /ml Dexamethasone;Therapeutic activities;Therapeutic exercise;Balance training;Manual lymph drainage;Manual techniques;Vasopneumatic Device;Taping;Splinting;Orthotic Fit/Training;Functional mobility training;Energy conservation;Dry needling;Gait training;Stair training;DME Instruction;Patient/family education;Passive range of motion;Spinal Manipulations;Joint Manipulations;Visual/perceptual remediation/compensation;Compression bandaging;Scar mobilization;Neuromuscular re-education;Cognitive remediation;Ultrasound;Parrafin;Contrast Bath;Fluidtherapy    PT Next Visit Plan Progress LE and core strength as tolerated. Continue gait and balance activity to tolerance    PT Home Exercise Plan Eval: seated march, LAQ; 05/13/20: Decompression exercise and bridge. 05/20/20: heel raise. 12/23 added side stepping and sidelying clam shells with green t-loop; 12/28 self massage with rolling pin    Consulted and Agree with Plan of Care Patient           Patient will benefit from skilled therapeutic intervention in order to improve the following deficits and impairments:  Abnormal gait,Decreased endurance,Decreased activity tolerance,Decreased strength,Pain,Decreased mobility,Difficulty walking,Impaired perceived functional ability,Postural dysfunction  Visit Diagnosis: Pain in left hip  Other abnormalities of gait and mobility  Low back pain, unspecified back pain laterality, unspecified chronicity, unspecified whether sciatica present     Problem List There are no problems to display for this patient.   5:39 PM, 05/29/20 Josue Hector PT DPT  Physical Therapist with [facility]  [phone] [place], [place], [address] Phone: [phone]   Fax:  [phone]  Name: Beth Malone MRN: [id number] Date of Birth: [birth year]

## 2020-06-02 ENCOUNTER — Telehealth (HOSPITAL_COMMUNITY): Payer: Self-pay

## 2020-06-02 NOTE — Telephone Encounter (Signed)
She did not feel good today and wants to cx tomorrow 06/03/20- she  will be here 06/05/20

## 2020-06-03 ENCOUNTER — Ambulatory Visit (HOSPITAL_COMMUNITY): Payer: Medicare HMO

## 2020-06-05 ENCOUNTER — Other Ambulatory Visit: Payer: Self-pay

## 2020-06-05 ENCOUNTER — Ambulatory Visit (HOSPITAL_COMMUNITY): Payer: Medicare HMO | Attending: Orthopedic Surgery

## 2020-06-05 ENCOUNTER — Encounter (HOSPITAL_COMMUNITY): Payer: Self-pay

## 2020-06-05 DIAGNOSIS — R2689 Other abnormalities of gait and mobility: Secondary | ICD-10-CM | POA: Insufficient documentation

## 2020-06-05 DIAGNOSIS — M545 Low back pain, unspecified: Secondary | ICD-10-CM | POA: Insufficient documentation

## 2020-06-05 DIAGNOSIS — M25552 Pain in left hip: Secondary | ICD-10-CM | POA: Insufficient documentation

## 2020-06-05 NOTE — Therapy (Signed)
[place], [place], [address] Phone: [phone]   Fax:  [phone]  Physical Therapy Treatment  Patient Details  Name: Beth Malone MRN: [id number] Date of Birth: [birth year] Referring Provider (PT): Rod Can MD    Encounter Date: 06/05/2020   PT End of Session - 06/05/20 1417    Visit Number 8    Number of Visits 12    Date for PT Re-Evaluation 06/20/20    Authorization Type A[employer]na Medicare (No auth, no VL)    PT Start Time 1403    PT Stop Time 1442    PT Time Calculation (min) 39 min    Activity Tolerance Patient limited by fatigue;Patient tolerated treatment well;Patient limited by pain;No increased pain    Behavior During Therapy [facility] for tasks assessed/performed           Past Medical History:  Diagnosis Date  . Anemia   . Anxiety   . Arthritis   . Chronic kidney disease    sees [facility]  . Diabetes mellitus    type II  . Dysphagia    last year or so   . Femur fracture ([place])   . GERD (gastroesophageal reflux disease)   . Heart murmur   . History of blood transfusion    tumor on tube and   . History of kidney stones    "stones in kidney"  . Hypertension     Past Surgical History:  Procedure Laterality Date  . ABDOMINAL HYSTERECTOMY     complete  . AV FISTULA PLACEMENT Left 10/03/2019   Procedure: LEFT ARM ARTERIOVENOUS (AV) FISTULA CREATION;  Surgeon: [name], MD;  Location: [place];  Service: Vascular;  Laterality: Left;  . BALLOON DILATION N/A 03/22/2016   Procedure: BALLOON DILATION;  Surgeon: [name], MD;  Location: [facility];  Service: Endoscopy;  Laterality: N/A;  . colonscopy  2014  . ESOPHAGEAL MANOMETRY N/A 04/05/2016   Procedure: ESOPHAGEAL MANOMETRY (EM);  Surgeon: [name], MD;  [facility];  Service: Endoscopy;  Laterality: N/A;  . ESOPHAGOGASTRODUODENOSCOPY (EGD) WITH PROPOFOL N/A 03/22/2016   Procedure:  ESOPHAGOGASTRODUODENOSCOPY (EGD) WITH PROPOFOL;  Surgeon: [name], MD;  [facility];  Service: Endoscopy;  Laterality: N/A;  . ESOPHAGOGASTRODUODENOSCOPY (EGD) WITH PROPOFOL N/A 06/01/2016   Procedure: ESOPHAGOGASTRODUODENOSCOPY (EGD) WITH PROPOFOL;  Surgeon: [name], MD;  [facility];  Service: Endoscopy;  Laterality: N/A;  . FEMUR SURGERY Right 2011   rod placed  . LIGATION OF ARTERIOVENOUS  FISTULA Left 10/10/2019   Procedure: LIGATION OF LEFT ARM FISTULA;  Surgeon: [name], MD;  Location: [place];  Service: Vascular;  Laterality: Left;  . TUBAL LIGATION      There were no vitals filed for this visit.   Subjective Assessment - 06/05/20 1405    Subjective Pt stated she feels like she is making good progress though has increased pain across LBP today, feels the cold weather plays a part.  Stated she started to arrive with [facility] though couldn't tolerate due to LBP and arrived wiht RW    Pertinent History LT hip OA    Patient Stated Goals Get so my back dont hurt so bad    Currently in Pain? Yes    Pain Score 5     Pain Location Back    Pain Orientation Lower    Pain Descriptors / Indicators Sharp    Pain Onset More than a  month ago    Pain Frequency Intermittent    Aggravating Factors  not sure, anything, walking, weight bearing and movement Lt LE.    Pain Relieving Factors Best in the morning, pressure/support    Effect of Pain on Daily Activities Limits                             [facility] Adult PT Treatment/Exercise - 06/05/20 0001      Bed Mobility   Bed Mobility Sit to Sidelying Left    Sit to Sidelying Left Supervision/Verbal cueing   Reviewed log rolling mechanics     Exercises   Exercises Lumbar      Lumbar Exercises: Stretches   Single Knee to Chest Stretch 2 reps;30 seconds    Single Knee to Chest Stretch Limitations towel assistance    Lower Trunk Rotation 5 reps;10 seconds      Lumbar Exercises: Standing    Other Standing Lumbar Exercises attempted marching, increased Lt LE wihtout BUE support    Other Standing Lumbar Exercises hip abd 15x BLE with HHA; Paloff RTB NBOS 2x 10      Lumbar Exercises: Seated   Sit to Stand 10 reps    Sit to Stand Limitations elevated height 23 inches      Lumbar Exercises: Supine   Bridge 10 reps;5 seconds    Bridge Limitations partial range; 2 sets                    PT Short Term Goals - 05/05/20 1478      PT SHORT TERM GOAL #1   Title Patient will be independent with initial HEP and self-management strategies to improve functional outcomes    Time 3    Period Weeks    Status New    Target Date 05/30/20             PT Long Term Goals - 05/05/20 0853      PT LONG TERM GOAL #1   Title Patient will improve FOTO score by 10% to indicate improvement in functional outcomes    Time 6    Period Weeks    Status New    Target Date 06/20/20      PT LONG TERM GOAL #2   Title Patient will be able to perform stand x 5 in < 12 seconds to demonstrate improvement in functional mobility and reduced risk for falls.    Time 6    Period Weeks    Status New    Target Date 06/20/20      PT LONG TERM GOAL #3   Title Patient will report at least 70% overall improvement in subjective complaint to indicate improvement in ability to perform ADLs.    Time 6    Period Weeks    Status New    Target Date 06/20/20      PT LONG TERM GOAL #4   Title Patient will be able to ambulate at least 275 feet during 2MWT with LRAD to demonstrate improved ability to perform functional mobility and associated tasks.    Time 6    Period Weeks    Status New    Target Date 06/20/20                 Plan - 06/05/20 1419    Clinical Impression Statement Pt limited with increased LBP upon arrival this session.  Began mat activities for core and gluteal  strengthening as well as stretches for lumbar support, reoprts of relief following.  Reviewed mechanics with log  rolling to reduce strain on lumbar region.  Pt reports increased Lt LE during weight bearing without HHA.  Added paloff exercises for core strengthening and balance training wiht NBOS wiht SBA for safety.  EOS reports reduced LBP.    Personal Factors and Comorbidities Comorbidity 2    Comorbidities HTN, DM    Examination-Activity Limitations Locomotion Level;Transfers;Stairs    Examination-Participation Restrictions Laundry;Community Activity;Cleaning    Stability/Clinical Decision Making Stable/Uncomplicated    Rehab Potential Fair    PT Frequency 2x / week    PT Duration 6 weeks    PT Treatment/Interventions ADLs/Self Care Home Management;Aquatic Therapy;Biofeedback;Cryotherapy;Electrical Stimulation;Moist Heat;Traction;Iontophoresis 4mg /ml Dexamethasone;Therapeutic activities;Therapeutic exercise;Balance training;Manual lymph drainage;Manual techniques;Vasopneumatic Device;Taping;Splinting;Orthotic Fit/Training;Functional mobility training;Energy conservation;Dry needling;Gait training;Stair training;DME Instruction;Patient/family education;Passive range of motion;Spinal Manipulations;Joint Manipulations;Visual/perceptual remediation/compensation;Compression bandaging;Scar mobilization;Neuromuscular re-education;Cognitive remediation;Ultrasound;Parrafin;Contrast Bath;Fluidtherapy    PT Next Visit Plan Resume standing exercises per pain.  Progress LE and core strength as tolerated. Continue gait and balance activity to tolerance    PT Home Exercise Plan Eval: seated march, LAQ; 05/13/20: Decompression exercise and bridge. 05/20/20: heel raise. 12/23 added side stepping and sidelying clam shells with green t-loop; 12/28 self massage with rolling pin           Patient will benefit from skilled therapeutic intervention in order to improve the following deficits and impairments:  Abnormal gait,Decreased endurance,Decreased activity tolerance,Decreased strength,Pain,Decreased mobility,Difficulty  walking,Impaired perceived functional ability,Postural dysfunction  Visit Diagnosis: Other abnormalities of gait and mobility  Low back pain, unspecified back pain laterality, unspecified chronicity, unspecified whether sciatica present  Pain in left hip     Problem List There are no problems to display for this patient.  Ihor Austin, LPTA/CLT; CBIS [phone]  Aldona Lento 06/05/2020, 2:53 PM  [facility] [address] [place], [place], [address] Phone: [phone]   Fax:  [phone]  Name: Beth Malone MRN: [id number] Date of Birth: 12-05-1945

## 2020-06-09 ENCOUNTER — Ambulatory Visit (HOSPITAL_COMMUNITY): Payer: Medicare HMO | Admitting: Physical Therapy

## 2020-06-09 ENCOUNTER — Encounter (HOSPITAL_COMMUNITY)
Admission: RE | Admit: 2020-06-09 | Discharge: 2020-06-09 | Disposition: A | Discharge status: Home / Self Care | Payer: Medicare HMO | Source: Ambulatory Visit | Attending: Internal Medicine | Admitting: Internal Medicine

## 2020-06-09 ENCOUNTER — Encounter (HOSPITAL_COMMUNITY): Payer: Medicare HMO

## 2020-06-11 ENCOUNTER — Encounter (HOSPITAL_COMMUNITY): Payer: Medicare HMO

## 2020-06-13 ENCOUNTER — Telehealth (HOSPITAL_COMMUNITY): Payer: Self-pay | Admitting: Physical Therapy

## 2020-06-13 NOTE — Telephone Encounter (Signed)
L/m to call our voicemail if you have any concerns if  the office is open or closed on 06/16/20 due to weather 

## 2020-06-16 ENCOUNTER — Ambulatory Visit (HOSPITAL_COMMUNITY): Payer: Medicare HMO | Admitting: Physical Therapy

## 2020-06-17 ENCOUNTER — Telehealth (HOSPITAL_COMMUNITY): Payer: Self-pay | Admitting: Occupational Therapy

## 2020-06-17 NOTE — Telephone Encounter (Signed)
pt called to cx appt for 06/18/20, no reason given, she will call back when ready to reschedule.  Guadelupe Sabin, OTR/L  [phone]

## 2020-06-18 ENCOUNTER — Ambulatory Visit (HOSPITAL_COMMUNITY): Payer: Medicare HMO | Admitting: Physical Therapy

## 2020-06-19 DIAGNOSIS — E538 Deficiency of other specified B group vitamins: Secondary | ICD-10-CM | POA: Diagnosis not present

## 2020-06-23 ENCOUNTER — Ambulatory Visit (HOSPITAL_COMMUNITY): Payer: Medicare HMO | Admitting: Physical Therapy

## 2020-06-23 ENCOUNTER — Encounter (HOSPITAL_COMMUNITY): Payer: Self-pay | Admitting: Physical Therapy

## 2020-06-23 ENCOUNTER — Other Ambulatory Visit: Payer: Self-pay

## 2020-06-23 DIAGNOSIS — M545 Low back pain, unspecified: Secondary | ICD-10-CM

## 2020-06-23 DIAGNOSIS — M25552 Pain in left hip: Secondary | ICD-10-CM | POA: Diagnosis not present

## 2020-06-23 DIAGNOSIS — R2689 Other abnormalities of gait and mobility: Secondary | ICD-10-CM | POA: Diagnosis not present

## 2020-06-23 NOTE — Therapy (Signed)
[facility] [address] [place], [place], [address] Phone: [phone]   Fax:  [phone]  Physical Therapy Treatment  Patient Details  Name: Beth Malone MRN: [id number] Date of Birth: [birth year] Referring Provider (PT): Rod Can MD   PHYSICAL THERAPY DISCHARGE SUMMARY  Visits from Start of Care: 9  Current functional level related to goals / functional outcomes: See below    Remaining deficits: See below   Education / Equipment: See assessment  Plan: Patient agrees to discharge.  Patient goals were met. Patient is being discharged due to meeting the stated rehab goals.  ?????      Encounter Date: 06/23/2020   PT End of Session - 06/23/20 1314    Visit Number 9    Number of Visits 12    Date for PT Re-Evaluation 06/23/20    Authorization Type [employer] Medicare (No auth, no VL)    PT Start Time 1308    PT Stop Time 1342    PT Time Calculation (min) 34 min    Activity Tolerance Patient tolerated treatment well;No increased pain    Behavior During Therapy WFL for tasks assessed/performed           Past Medical History:  Diagnosis Date  . Anemia   . Anxiety   . Arthritis   . Chronic kidney disease    sees [facility]  . Diabetes mellitus    type II  . Dysphagia    last year or so   . Femur fracture ([place])   . GERD (gastroesophageal reflux disease)   . Heart murmur   . History of blood transfusion    tumor on tube and   . History of kidney stones    "stones in kidney"  . Hypertension     Past Surgical History:  Procedure Laterality Date  . ABDOMINAL HYSTERECTOMY     complete  . AV FISTULA PLACEMENT Left 10/03/2019   Procedure: LEFT ARM ARTERIOVENOUS (AV) FISTULA CREATION;  Surgeon: [name], MD;  Location: [facility];  Service: Vascular;  Laterality: Left;  . BALLOON DILATION N/A 03/22/2016   Procedure: BALLOON DILATION;  Surgeon: [name], MD;  Location: [facility];   Service: Endoscopy;  Laterality: N/A;  . colonscopy  2014  . ESOPHAGEAL MANOMETRY N/A 04/05/2016   Procedure: ESOPHAGEAL MANOMETRY (EM);  Surgeon: [name], MD;  Location: [facility];  Service: Endoscopy;  Laterality: N/A;  . ESOPHAGOGASTRODUODENOSCOPY (EGD) WITH PROPOFOL N/A 03/22/2016   Procedure: ESOPHAGOGASTRODUODENOSCOPY (EGD) WITH PROPOFOL;  Surgeon: [name], MD;  Location: [facility];  Service: Endoscopy;  Laterality: N/A;  . ESOPHAGOGASTRODUODENOSCOPY (EGD) WITH PROPOFOL N/A 06/01/2016   Procedure: ESOPHAGOGASTRODUODENOSCOPY (EGD) WITH PROPOFOL;  Surgeon: [name], MD;  [facility];  Service: Endoscopy;  Laterality: N/A;  . FEMUR SURGERY Right 2011   rod placed  . LIGATION OF ARTERIOVENOUS  FISTULA Left 10/10/2019   Procedure: LIGATION OF LEFT ARM FISTULA;  Surgeon: [name], MD;  Location: [facility];  Service: Vascular;  Laterality: Left;  . TUBAL LIGATION      There were no vitals filed for this visit.   Subjective Assessment - 06/23/20 1313    Subjective Patient presents to therapy fate 2. 5 week hold. Her son had Covid and she was caring for him, then the inclimate weather kept her form attending. She says she is doing much better, and is compliant with her exercise. She says the worst thing is in  the morning she is stiff but feels this more in her hip than her back. She says once she gets moving this gets better. She reports about 90% improvement since starting therapy.    Pertinent History LT hip OA    Patient Stated Goals Get so my back dont hurt so bad    Currently in Pain? No/denies    Pain Onset More than a month ago              [facility] PT Assessment - 06/23/20 0001      Assessment   Medical Diagnosis LBP, LT hip pain     Referring Provider (PT) Rod Can MD     Onset Date/Surgical Date 03/31/20    Prior Therapy Yes      Precautions   Precautions None      Restrictions   Weight Bearing Restrictions No      Balance  Screen   Has the patient fallen in the past 6 months No      [place] residence      Prior Function   Level of Independence Independent with basic ADLs;Independent with household mobility with device      Cognition   Overall Cognitive Status Within Functional Limits for tasks assessed      Observation/Other Assessments   Focus on Therapeutic Outcomes (FOTO)  39% limited   was 64% limited     Transfers   Five time sit to stand comments  11.68 sec with no UE   was 14.2 with heavy use of UEs     Ambulation/Gait   Ambulation/Gait Yes    Ambulation/Gait Assistance 6: Modified independent (Device/Increase time)    Ambulation Distance (Feet) 315 Feet   was 170   Assistive device Straight cane    Gait Pattern Decreased step length - right;Decreased step length - left;Trendelenburg    Ambulation Surface Level;Indoor    Gait Comments 2MWT                                   PT Short Term Goals - 06/23/20 1327      PT SHORT TERM GOAL #1   Title Patient will be independent with initial HEP and self-management strategies to improve functional outcomes    Baseline Reports complaince    Time 3    Period Weeks    Status Achieved    Target Date 05/30/20             PT Long Term Goals - 06/23/20 1327      PT LONG TERM GOAL #1   Title Patient will improve FOTO score by 10% to indicate improvement in functional outcomes    Baseline Improved by 25%    Time 6    Period Weeks    Status Achieved      PT LONG TERM GOAL #2   Title Patient will be able to perform stand x 5 in < 12 seconds to demonstrate improvement in functional mobility and reduced risk for falls.    Baseline Current 11.68 sec with no UE    Time 6    Period Weeks    Status Achieved      PT LONG TERM GOAL #3   Title Patient will report at least 70% overall improvement in subjective complaint to indicate improvement in ability to perform ADLs.    Baseline  Reports 90% improvement  Time 6    Period Weeks    Status Achieved      PT LONG TERM GOAL #4   Title Patient will be able to ambulate at least 275 feet during 2MWT with LRAD to demonstrate improved ability to perform functional mobility and associated tasks.    Baseline Current 315 feet using SPC    Time 6    Period Weeks    Status Achieved                 Plan - 06/23/20 1709    Clinical Impression Statement Patient presents to therapy after 2.5 week hold. Patient reports compliance with HEP and that she is feeling much better. Patient reports significant improvement in function. Reassessment reveals patient has currently met all therapy goals. Reviewed HEP and HEP progressions and issued patient updated handout for DC today. Patient instructed to follow up with therapy services with any further questions or concerns.    Personal Factors and Comorbidities Comorbidity 2    Comorbidities HTN, DM    Examination-Activity Limitations Locomotion Level;Transfers;Stairs    Examination-Participation Restrictions Laundry;Community Activity;Cleaning    Stability/Clinical Decision Making Stable/Uncomplicated    Rehab Potential Fair    PT Frequency 2x / week    PT Duration 6 weeks    PT Treatment/Interventions ADLs/Self Care Home Management;Aquatic Therapy;Biofeedback;Cryotherapy;Electrical Stimulation;Moist Heat;Traction;Iontophoresis 14m/ml Dexamethasone;Therapeutic activities;Therapeutic exercise;Balance training;Manual lymph drainage;Manual techniques;Vasopneumatic Device;Taping;Splinting;Orthotic Fit/Training;Functional mobility training;Energy conservation;Dry needling;Gait training;Stair training;DME Instruction;Patient/family education;Passive range of motion;Spinal Manipulations;Joint Manipulations;Visual/perceptual remediation/compensation;Compression bandaging;Scar mobilization;Neuromuscular re-education;Cognitive remediation;Ultrasound;Parrafin;Contrast Bath;Fluidtherapy    PT Next  Visit Plan DC to HEP    PT Home Exercise Plan Eval: seated march, LAQ; 05/13/20: Decompression exercise and bridge. 05/20/20: heel raise. 12/23 added side stepping and sidelying clam shells with green t-loop; 12/28 self massage with rolling pin    Consulted and Agree with Plan of Care Patient           Patient will benefit from skilled therapeutic intervention in order to improve the following deficits and impairments:  Abnormal gait,Decreased endurance,Decreased activity tolerance,Decreased strength,Pain,Decreased mobility,Difficulty walking,Impaired perceived functional ability,Postural dysfunction  Visit Diagnosis: Other abnormalities of gait and mobility  Low back pain, unspecified back pain laterality, unspecified chronicity, unspecified whether sciatica present  Pain in left hip     Problem List There are no problems to display for this patient.  5:16 PM, 06/23/20 CJosue HectorPT DPT  Physical [facility] [phone] Lakesh[address] StreetSRound [place] [address]Phone: [phone]  Fax:  [phone] Name: LEstell PucciniMRN: [id number] of Birth: 2Aug 01, 1947

## 2020-06-23 NOTE — Patient Instructions (Signed)
Access Code: [email or web address] URL: [email or web address] Date: 06/23/2020 Prepared by: Josue Hector  Exercises Sit to Stand - 1 x daily - 5 x weekly - 2 sets - 10 reps Hip Extension with Resistance Loop - 1 x daily - 5 x weekly - 2 sets - 10 reps Hip Abduction with Resistance Loop - 1 x daily - 5 x weekly - 2 sets - 10 reps

## 2020-06-25 ENCOUNTER — Encounter (HOSPITAL_COMMUNITY): Payer: Medicare HMO | Admitting: Physical Therapy

## 2020-06-30 ENCOUNTER — Other Ambulatory Visit: Payer: Self-pay

## 2020-06-30 ENCOUNTER — Encounter (HOSPITAL_COMMUNITY): Payer: Self-pay

## 2020-06-30 ENCOUNTER — Encounter (HOSPITAL_COMMUNITY): Admission: RE | Admit: 2020-06-30 | Payer: Medicare HMO | Source: Ambulatory Visit

## 2020-06-30 ENCOUNTER — Encounter (HOSPITAL_COMMUNITY)
Admission: RE | Admit: 2020-06-30 | Discharge: 2020-06-30 | Disposition: A | Discharge status: Home / Self Care | Payer: Medicare HMO | Source: Ambulatory Visit | Attending: Internal Medicine | Admitting: Internal Medicine

## 2020-06-30 DIAGNOSIS — N189 Chronic kidney disease, unspecified: Secondary | ICD-10-CM | POA: Diagnosis present

## 2020-06-30 DIAGNOSIS — D631 Anemia in chronic kidney disease: Secondary | ICD-10-CM | POA: Insufficient documentation

## 2020-06-30 LAB — RENAL FUNCTION PANEL
Albumin: 3.7 g/dL (ref 3.5–5.0)
Anion gap: 7 (ref 5–15)
BUN: 25 mg/dL — ABNORMAL HIGH (ref 8–23)
CO2: 22 mmol/L (ref 22–32)
Calcium: 9.1 mg/dL (ref 8.9–10.3)
Chloride: 110 mmol/L (ref 98–111)
Creatinine, Ser: 2.07 mg/dL — ABNORMAL HIGH (ref 0.44–1.00)
GFR, Estimated: 25 mL/min — ABNORMAL LOW (ref >60)
Glucose, Bld: 173 mg/dL — ABNORMAL HIGH (ref 70–99)
Phosphorus: 3.9 mg/dL (ref 2.5–4.6)
Potassium: 4 mmol/L (ref 3.5–5.1)
Sodium: 139 mmol/L (ref 135–145)

## 2020-06-30 LAB — IRON AND TIBC
Iron: 90 ug/dL (ref 28–170)
Saturation Ratios: 38 % — ABNORMAL HIGH (ref 10.4–31.8)
TIBC: 236 ug/dL — ABNORMAL LOW (ref 250–450)
UIBC: 146 ug/dL

## 2020-06-30 LAB — POCT HEMOGLOBIN-HEMACUE: Hemoglobin: 10.9 g/dL — ABNORMAL LOW (ref 12.0–15.0)

## 2020-06-30 LAB — FERRITIN: Ferritin: 462 ng/mL — ABNORMAL HIGH (ref 11–307)

## 2020-06-30 MED ORDER — EPOETIN ALFA-EPBX 10000 UNIT/ML IJ SOLN
10000.0000 [IU] | Freq: Once | INTRAMUSCULAR | Status: AC
  Administered 2020-06-30: 10000 [IU] via SUBCUTANEOUS

## 2020-06-30 MED ORDER — EPOETIN ALFA-EPBX 10000 UNIT/ML IJ SOLN
INTRAMUSCULAR | Status: AC
  Filled 2020-06-30: qty 1

## 2020-07-01 ENCOUNTER — Encounter (HOSPITAL_COMMUNITY): Payer: Medicare HMO

## 2020-07-01 DIAGNOSIS — I129 Hypertensive chronic kidney disease with stage 1 through stage 4 chronic kidney disease, or unspecified chronic kidney disease: Secondary | ICD-10-CM | POA: Diagnosis not present

## 2020-07-01 DIAGNOSIS — E1121 Type 2 diabetes mellitus with diabetic nephropathy: Secondary | ICD-10-CM | POA: Diagnosis not present

## 2020-07-01 DIAGNOSIS — N184 Chronic kidney disease, stage 4 (severe): Secondary | ICD-10-CM | POA: Diagnosis not present

## 2020-07-01 DIAGNOSIS — E7800 Pure hypercholesterolemia, unspecified: Secondary | ICD-10-CM | POA: Diagnosis not present

## 2020-07-01 LAB — PARATHYROID HORMONE, INTACT (NO CA): PTH: 40 pg/mL (ref 15–65)

## 2020-07-03 ENCOUNTER — Encounter (HOSPITAL_COMMUNITY): Payer: Medicare HMO | Admitting: Physical Therapy

## 2020-07-16 DIAGNOSIS — N185 Chronic kidney disease, stage 5: Secondary | ICD-10-CM | POA: Diagnosis not present

## 2020-07-16 DIAGNOSIS — N119 Chronic tubulo-interstitial nephritis, unspecified: Secondary | ICD-10-CM | POA: Diagnosis not present

## 2020-07-16 DIAGNOSIS — I120 Hypertensive chronic kidney disease with stage 5 chronic kidney disease or end stage renal disease: Secondary | ICD-10-CM | POA: Diagnosis not present

## 2020-07-16 DIAGNOSIS — E876 Hypokalemia: Secondary | ICD-10-CM | POA: Diagnosis not present

## 2020-07-16 DIAGNOSIS — D631 Anemia in chronic kidney disease: Secondary | ICD-10-CM | POA: Diagnosis not present

## 2020-07-16 DIAGNOSIS — E1122 Type 2 diabetes mellitus with diabetic chronic kidney disease: Secondary | ICD-10-CM | POA: Diagnosis not present

## 2020-07-21 DIAGNOSIS — E538 Deficiency of other specified B group vitamins: Secondary | ICD-10-CM | POA: Diagnosis not present

## 2020-07-22 DIAGNOSIS — M25551 Pain in right hip: Secondary | ICD-10-CM | POA: Diagnosis not present

## 2020-07-28 ENCOUNTER — Other Ambulatory Visit: Payer: Self-pay

## 2020-07-28 ENCOUNTER — Encounter (HOSPITAL_COMMUNITY): Payer: Self-pay

## 2020-07-28 ENCOUNTER — Encounter (HOSPITAL_COMMUNITY)
Admission: RE | Admit: 2020-07-28 | Discharge: 2020-07-28 | Disposition: A | Discharge status: Home / Self Care | Payer: Medicare HMO | Source: Ambulatory Visit | Attending: Internal Medicine | Admitting: Internal Medicine

## 2020-07-28 DIAGNOSIS — N185 Chronic kidney disease, stage 5: Secondary | ICD-10-CM | POA: Insufficient documentation

## 2020-07-28 DIAGNOSIS — D631 Anemia in chronic kidney disease: Secondary | ICD-10-CM | POA: Diagnosis present

## 2020-07-28 LAB — IRON AND TIBC
Iron: 98 ug/dL (ref 28–170)
Saturation Ratios: 37 % — ABNORMAL HIGH (ref 10.4–31.8)
TIBC: 268 ug/dL (ref 250–450)
UIBC: 170 ug/dL

## 2020-07-28 LAB — FERRITIN: Ferritin: 497 ng/mL — ABNORMAL HIGH (ref 11–307)

## 2020-07-28 LAB — POCT HEMOGLOBIN-HEMACUE: Hemoglobin: 11.6 g/dL — ABNORMAL LOW (ref 12.0–15.0)

## 2020-07-28 MED ORDER — EPOETIN ALFA-EPBX 10000 UNIT/ML IJ SOLN: 10000.0000 [IU] | Freq: Once | INTRAMUSCULAR | Status: AC

## 2020-07-28 MED ORDER — EPOETIN ALFA-EPBX 10000 UNIT/ML IJ SOLN
INTRAMUSCULAR | Status: AC
  Administered 2020-07-28: 10000 [IU] via SUBCUTANEOUS
  Filled 2020-07-28: qty 1

## 2020-07-30 DIAGNOSIS — M25551 Pain in right hip: Secondary | ICD-10-CM | POA: Diagnosis not present

## 2020-07-31 DIAGNOSIS — E1121 Type 2 diabetes mellitus with diabetic nephropathy: Secondary | ICD-10-CM | POA: Diagnosis not present

## 2020-07-31 DIAGNOSIS — N184 Chronic kidney disease, stage 4 (severe): Secondary | ICD-10-CM | POA: Diagnosis not present

## 2020-07-31 DIAGNOSIS — E7800 Pure hypercholesterolemia, unspecified: Secondary | ICD-10-CM | POA: Diagnosis not present

## 2020-07-31 DIAGNOSIS — I129 Hypertensive chronic kidney disease with stage 1 through stage 4 chronic kidney disease, or unspecified chronic kidney disease: Secondary | ICD-10-CM | POA: Diagnosis not present

## 2020-08-11 ENCOUNTER — Encounter: Payer: Self-pay | Admitting: Emergency Medicine

## 2020-08-11 ENCOUNTER — Ambulatory Visit
Admission: EM | Admit: 2020-08-11 | Discharge: 2020-08-11 | Disposition: A | Discharge status: Home / Self Care | Payer: Medicare HMO | Attending: Family Medicine | Admitting: Family Medicine

## 2020-08-11 ENCOUNTER — Other Ambulatory Visit: Payer: Self-pay

## 2020-08-11 DIAGNOSIS — E11622 Type 2 diabetes mellitus with other skin ulcer: Secondary | ICD-10-CM | POA: Diagnosis not present

## 2020-08-11 DIAGNOSIS — S81811A Laceration without foreign body, right lower leg, initial encounter: Secondary | ICD-10-CM | POA: Diagnosis not present

## 2020-08-11 DIAGNOSIS — L98499 Non-pressure chronic ulcer of skin of other sites with unspecified severity: Secondary | ICD-10-CM | POA: Diagnosis not present

## 2020-08-11 DIAGNOSIS — Z7984 Long term (current) use of oral hypoglycemic drugs: Secondary | ICD-10-CM

## 2020-08-11 DIAGNOSIS — Z23 Encounter for immunization: Secondary | ICD-10-CM | POA: Diagnosis not present

## 2020-08-11 MED ORDER — DOXYCYCLINE HYCLATE 100 MG PO CAPS: 100.0000 mg | ORAL_CAPSULE | Freq: Two times a day (BID) | ORAL | 0 refills | Status: AC

## 2020-08-11 MED ORDER — TETANUS-DIPHTH-ACELL PERTUSSIS 5-2.5-18.5 LF-MCG/0.5 IM SUSY
0.5000 mL | PREFILLED_SYRINGE | Freq: Once | INTRAMUSCULAR | Status: AC
  Administered 2020-08-11: 0.5 mL via INTRAMUSCULAR

## 2020-08-11 NOTE — ED Provider Notes (Signed)
[facility]    CSN: [id number] Arrival date & time: 08/11/20  1638      History   Chief Complaint Chief Complaint  Patient presents with  . Laceration    HPI Beth Malone is a 75 y.o. female.   HPI Patient with a history of diabetes, obesity, CKD presents with a skin tear to left lower extremity after hitting her shin with the car door. Patient is overdue for TDAP, last vaccine in 2013. Endorses pain with touching, however is able to walk and denies any muscular related injury. Past Medical History:  Diagnosis Date  . Anemia   . Anxiety   . Arthritis   . Chronic kidney disease    sees [facility]  . Diabetes mellitus    type II  . Dysphagia    last year or so   . Femur fracture ([place])   . GERD (gastroesophageal reflux disease)   . Heart murmur   . History of blood transfusion    tumor on tube and   . History of kidney stones    "stones in kidney"  . Hypertension     There are no problems to display for this patient.   Past Surgical History:  Procedure Laterality Date  . ABDOMINAL HYSTERECTOMY     complete  . AV FISTULA PLACEMENT Left 10/03/2019   Procedure: LEFT ARM ARTERIOVENOUS (AV) FISTULA CREATION;  Surgeon: [name], MD;  Location: [facility];  Service: Vascular;  Laterality: Left;  . BALLOON DILATION N/A 03/22/2016   Procedure: BALLOON DILATION;  Surgeon: [name], MD;  Location: [facility];  Service: Endoscopy;  Laterality: N/A;  . colonscopy  2014  . ESOPHAGEAL MANOMETRY N/A 04/05/2016   Procedure: ESOPHAGEAL MANOMETRY (EM);  Surgeon: [name], MD;  [facility];  Service: Endoscopy;  Laterality: N/A;  . ESOPHAGOGASTRODUODENOSCOPY (EGD) WITH PROPOFOL N/A 03/22/2016   Procedure: ESOPHAGOGASTRODUODENOSCOPY (EGD) WITH PROPOFOL;  Surgeon: [name], MD;  [facility];  Service: Endoscopy;  Laterality: N/A;  . ESOPHAGOGASTRODUODENOSCOPY (EGD) WITH PROPOFOL N/A 06/01/2016    Procedure: ESOPHAGOGASTRODUODENOSCOPY (EGD) WITH PROPOFOL;  Surgeon: [name], MD;  [facility];  Service: Endoscopy;  Laterality: N/A;  . FEMUR SURGERY Right 2011   rod placed  . LIGATION OF ARTERIOVENOUS  FISTULA Left 10/10/2019   Procedure: LIGATION OF LEFT ARM FISTULA;  Surgeon: [name], MD;  Location: [facility];  Service: Vascular;  Laterality: Left;  . TUBAL LIGATION      OB History    Gravida  2   Para  2   Term  2   Preterm      AB      Living  2     SAB      IAB      Ectopic      Multiple      Live Births               Home Medications    Prior to Admission medications   Medication Sig Start Date End Date Taking? Authorizing Provider  acetaminophen (TYLENOL) 500 MG tablet Take 500 mg by mouth every 6 (six) hours as needed.    [provider]  ALPRAZolam Duanne Moron) 0.25 MG tablet Take 0.25 mg by mouth daily as needed for anxiety. anxiety     [provider]  amLODipine (NORVASC) 10 MG tablet Take 10 mg by mouth daily. 04/10/16   [provider]  atorvastatin (LIPITOR) 20  MG tablet Take 20 mg by mouth at bedtime.     [provider]  Biotin 1000 MCG tablet Take 1,000 mcg by mouth daily.    [provider]  cetirizine (ZYRTEC) 10 MG tablet Take 10 mg by mouth daily as needed for allergies.     [provider]  epoetin alfa-epbx (RETACRIT) 16109 UNIT/ML injection 10,000 Units every 30 (thirty) days.     Justin Mend, MD  epoetin alfa-epbx (RETACRIT) 60454 UNIT/ML injection Inject 15,000 Units into the skin.     Justin Mend, MD  famotidine (PEPCID) 20 MG tablet Take 40 mg by mouth 2 (two) times daily.     [provider]  fluticasone (FLONASE) 50 MCG/ACT nasal spray Place 1 spray into both nostrils daily as needed for allergies or rhinitis.    [provider]  furosemide (LASIX) 40 MG tablet Take 40 mg by mouth 2 (two) times daily.     Justin Mend, MD   glipiZIDE (GLUCOTROL) 5 MG tablet Take 5 mg by mouth 2 (two) times daily before a meal.     [provider]  methocarbamol (ROBAXIN) 500 MG tablet Take 1 tablet (500 mg total) by mouth 4 (four) times daily. 04/19/20   Fransico Meadow, PA-C  Metoprolol Succinate 50 MG CS24 Take 50 mg by mouth daily.     [provider]  Potassium Chloride ER 20 MEQ TBCR Take 40 mEq by mouth daily.  07/17/19   [provider]  traMADol (ULTRAM) 50 MG tablet Take 1 tablet (50 mg total) by mouth every 6 (six) hours as needed. 04/19/20 04/19/21  Fransico Meadow, PA-C  Turmeric 500 MG TABS Take 500 mg by mouth 2 (two) times daily.     [provider]    Family History Family History  Problem Relation Age of Onset  . Diabetes Mother   . Heart failure Mother   . Heart failure Father     Social History Social History   Tobacco Use  . Smoking status: Never Smoker  . Smokeless tobacco: Never Used  Vaping Use  . Vaping Use: Never used  Substance Use Topics  . Alcohol use: Yes    Comment: rarely drink wine  . Drug use: No     Allergies   Augmentin [amoxicillin-pot clavulanate] and Codeine   Review of Systems Review of Systems Pertinent negatives listed in HPI   Physical Exam Triage Vital Signs ED Triage Vitals [08/11/20 1647]  Enc Vitals Group     BP      Pulse      Resp      Temp      Temp src      SpO2      Weight      Height      Head Circumference      Peak Flow      Pain Score 0     Pain Loc      Pain Edu?      Excl. in [place]?    No data found.  Updated Vital Signs There were no vitals taken for this visit.  Visual Acuity Right Eye Distance:   Left Eye Distance:   Bilateral Distance:    Right Eye Near:   Left Eye Near:    Bilateral Near:     Physical Exam Constitutional:      Appearance: She is obese. She is not ill-appearing.  Cardiovascular:     Rate and Rhythm:  Normal rate and regular rhythm.  Pulmonary:     Effort: Pulmonary  effort is normal.     Breath sounds: Normal breath sounds.  Musculoskeletal:     Cervical back: Normal range of motion.  Skin:    Capillary Refill: Capillary refill takes less than 2 seconds.       Neurological:     General: No focal deficit present.     Mental Status: She is alert.  Psychiatric:        Attention and Perception: Attention normal.        Mood and Affect: Mood normal.        Speech: Speech normal.       UC Treatments / Results  Labs (all labs ordered are listed, but only abnormal results are displayed) Labs Reviewed - No data to display  EKG   Radiology No results found.  Procedures Wound Care  Date/Time: 08/11/2020 5:53 PM Performed by: Scot Jun, FNP Authorized by: Scot Jun, FNP   Consent:    Consent obtained:  Verbal   Consent given by:  Patient   Risks, benefits, and alternatives were discussed: yes     Risks discussed:  Bleeding and infection   Alternatives discussed:  No treatment Skin layer closed with:    Wound care performed:  Steri-strips placed Dressing:    Dressing applied:  Steri-Strips and Tegaderm 2x3   Wrapped with:  Bulky dressing and Coban 4 inch Post-procedure details:    Procedure completion:  Tolerated well, no immediate complications   (including critical care time)  Medications Ordered in UC Medications - No data to display  Initial Impression / Assessment and Plan / UC Course  I have reviewed the triage vital signs and the nursing notes.  Pertinent labs & imaging results that were available during my care of the patient were reviewed by me and considered in my medical decision making (see chart for details).     Skin tear in patient with Type 2 Diabetes. Wound care completed with Steri strips, sterile dressing and gauze dressing.Bleeding controlled. TDAP updated. Skin is frail and then, given diabetes and high risk for secondary infection, covering with Doxycyline BID x 7 days. Patient advised to  follow-up with PCP in 1 week for wound check. Final Clinical Impressions(s) / UC Diagnoses   Final diagnoses:  Skin tear of right lower leg without complication, initial encounter  Type 2 diabetes mellitus with other skin ulcer, unspecified whether long term insulin use ([place])     Discharge Instructions     Leave dressing in place today. Beginning tomorrow change twice daily and cleanse with regular soap and water. Complete all antibiotics. Follow-up with primary care doctor in 1 week for wound recheck to ensure wound is healing appropriately.    ED Prescriptions    Medication Sig Dispense Auth. Provider   doxycycline (VIBRAMYCIN) 100 MG capsule Take 1 capsule (100 mg total) by mouth 2 (two) times daily for 7 days. 14 capsule Scot Jun, FNP     PDMP not reviewed this encounter.   Scot Jun, FNP 08/11/20 1753

## 2020-08-11 NOTE — ED Triage Notes (Signed)
Skin tear to LT shin after hitting it on her car door today

## 2020-08-11 NOTE — Discharge Instructions (Addendum)
Leave dressing in place today. Beginning tomorrow change twice daily and cleanse with regular soap and water. Complete all antibiotics. Follow-up with primary care doctor in 1 week for wound recheck to ensure wound is healing appropriately.

## 2020-08-12 DIAGNOSIS — M1611 Unilateral primary osteoarthritis, right hip: Secondary | ICD-10-CM | POA: Diagnosis not present

## 2020-08-21 DIAGNOSIS — E7800 Pure hypercholesterolemia, unspecified: Secondary | ICD-10-CM | POA: Diagnosis not present

## 2020-08-21 DIAGNOSIS — S81812D Laceration without foreign body, left lower leg, subsequent encounter: Secondary | ICD-10-CM | POA: Diagnosis not present

## 2020-08-21 DIAGNOSIS — I700 Atherosclerosis of aorta: Secondary | ICD-10-CM | POA: Diagnosis not present

## 2020-08-21 DIAGNOSIS — N184 Chronic kidney disease, stage 4 (severe): Secondary | ICD-10-CM | POA: Diagnosis not present

## 2020-08-21 DIAGNOSIS — I129 Hypertensive chronic kidney disease with stage 1 through stage 4 chronic kidney disease, or unspecified chronic kidney disease: Secondary | ICD-10-CM | POA: Diagnosis not present

## 2020-08-21 DIAGNOSIS — E538 Deficiency of other specified B group vitamins: Secondary | ICD-10-CM | POA: Diagnosis not present

## 2020-08-21 DIAGNOSIS — E1121 Type 2 diabetes mellitus with diabetic nephropathy: Secondary | ICD-10-CM | POA: Diagnosis not present

## 2020-08-21 DIAGNOSIS — J309 Allergic rhinitis, unspecified: Secondary | ICD-10-CM | POA: Diagnosis not present

## 2020-08-25 ENCOUNTER — Encounter (HOSPITAL_COMMUNITY): Payer: Self-pay

## 2020-08-25 ENCOUNTER — Other Ambulatory Visit: Payer: Self-pay

## 2020-08-25 ENCOUNTER — Encounter (HOSPITAL_COMMUNITY)
Admission: RE | Admit: 2020-08-25 | Discharge: 2020-08-25 | Disposition: A | Discharge status: Home / Self Care | Payer: Medicare HMO | Source: Ambulatory Visit | Attending: Internal Medicine | Admitting: Internal Medicine

## 2020-08-25 DIAGNOSIS — N185 Chronic kidney disease, stage 5: Secondary | ICD-10-CM | POA: Insufficient documentation

## 2020-08-25 DIAGNOSIS — D631 Anemia in chronic kidney disease: Secondary | ICD-10-CM | POA: Insufficient documentation

## 2020-08-25 LAB — IRON AND TIBC
Iron: 77 ug/dL (ref 28–170)
Saturation Ratios: 33 % — ABNORMAL HIGH (ref 10.4–31.8)
TIBC: 235 ug/dL — ABNORMAL LOW (ref 250–450)
UIBC: 158 ug/dL

## 2020-08-25 LAB — RENAL FUNCTION PANEL
Albumin: 3.7 g/dL (ref 3.5–5.0)
Anion gap: 12 (ref 5–15)
BUN: 33 mg/dL — ABNORMAL HIGH (ref 8–23)
CO2: 23 mmol/L (ref 22–32)
Calcium: 9.5 mg/dL (ref 8.9–10.3)
Chloride: 102 mmol/L (ref 98–111)
Creatinine, Ser: 2.24 mg/dL — ABNORMAL HIGH (ref 0.44–1.00)
GFR, Estimated: 22 mL/min — ABNORMAL LOW (ref >60)
Glucose, Bld: 113 mg/dL — ABNORMAL HIGH (ref 70–99)
Phosphorus: 3.9 mg/dL (ref 2.5–4.6)
Potassium: 4.5 mmol/L (ref 3.5–5.1)
Sodium: 137 mmol/L (ref 135–145)

## 2020-08-25 LAB — FERRITIN: Ferritin: 512 ng/mL — ABNORMAL HIGH (ref 11–307)

## 2020-08-25 MED ORDER — EPOETIN ALFA-EPBX 10000 UNIT/ML IJ SOLN
INTRAMUSCULAR | Status: AC
  Filled 2020-08-25: qty 1

## 2020-08-25 MED ORDER — EPOETIN ALFA-EPBX 10000 UNIT/ML IJ SOLN
10000.0000 [IU] | Freq: Once | INTRAMUSCULAR | Status: AC
  Administered 2020-08-25: 10000 [IU] via SUBCUTANEOUS

## 2020-08-26 LAB — PARATHYROID HORMONE, INTACT (NO CA): PTH: 36 pg/mL (ref 15–65)

## 2020-09-02 DIAGNOSIS — M25551 Pain in right hip: Secondary | ICD-10-CM | POA: Diagnosis not present

## 2020-09-09 ENCOUNTER — Ambulatory Visit (HOSPITAL_COMMUNITY): Payer: Medicare HMO

## 2020-09-09 DIAGNOSIS — M84350D Stress fracture, pelvis, subsequent encounter for fracture with routine healing: Secondary | ICD-10-CM | POA: Diagnosis not present

## 2020-09-15 LAB — POCT HEMOGLOBIN-HEMACUE: Hemoglobin: 11.8 g/dL — ABNORMAL LOW (ref 12.0–15.0)

## 2020-09-22 DIAGNOSIS — E538 Deficiency of other specified B group vitamins: Secondary | ICD-10-CM | POA: Diagnosis not present

## 2020-09-24 ENCOUNTER — Encounter (HOSPITAL_COMMUNITY)
Admission: RE | Admit: 2020-09-24 | Discharge: 2020-09-24 | Disposition: A | Discharge status: Home / Self Care | Payer: Medicare HMO | Source: Ambulatory Visit | Attending: Internal Medicine | Admitting: Internal Medicine

## 2020-09-24 ENCOUNTER — Other Ambulatory Visit: Payer: Self-pay

## 2020-09-24 DIAGNOSIS — D631 Anemia in chronic kidney disease: Secondary | ICD-10-CM | POA: Diagnosis not present

## 2020-09-24 DIAGNOSIS — N185 Chronic kidney disease, stage 5: Secondary | ICD-10-CM | POA: Diagnosis present

## 2020-09-24 LAB — IRON AND TIBC
Iron: 83 ug/dL (ref 28–170)
Saturation Ratios: 35 % — ABNORMAL HIGH (ref 10.4–31.8)
TIBC: 238 ug/dL — ABNORMAL LOW (ref 250–450)
UIBC: 155 ug/dL

## 2020-09-24 LAB — POCT HEMOGLOBIN-HEMACUE: Hemoglobin: 10.9 g/dL — ABNORMAL LOW (ref 12.0–15.0)

## 2020-09-24 LAB — FERRITIN: Ferritin: 350 ng/mL — ABNORMAL HIGH (ref 11–307)

## 2020-09-24 MED ORDER — EPOETIN ALFA-EPBX 10000 UNIT/ML IJ SOLN
INTRAMUSCULAR | Status: AC
  Filled 2020-09-24: qty 1

## 2020-09-24 MED ORDER — EPOETIN ALFA-EPBX 10000 UNIT/ML IJ SOLN
10000.0000 [IU] | INTRAMUSCULAR | Status: DC
  Administered 2020-09-24: 10000 [IU] via SUBCUTANEOUS

## 2020-10-09 DIAGNOSIS — E7800 Pure hypercholesterolemia, unspecified: Secondary | ICD-10-CM | POA: Diagnosis not present

## 2020-10-09 DIAGNOSIS — N184 Chronic kidney disease, stage 4 (severe): Secondary | ICD-10-CM | POA: Diagnosis not present

## 2020-10-09 DIAGNOSIS — K219 Gastro-esophageal reflux disease without esophagitis: Secondary | ICD-10-CM | POA: Diagnosis not present

## 2020-10-09 DIAGNOSIS — E1121 Type 2 diabetes mellitus with diabetic nephropathy: Secondary | ICD-10-CM | POA: Diagnosis not present

## 2020-10-09 DIAGNOSIS — I129 Hypertensive chronic kidney disease with stage 1 through stage 4 chronic kidney disease, or unspecified chronic kidney disease: Secondary | ICD-10-CM | POA: Diagnosis not present

## 2020-10-17 DIAGNOSIS — R0781 Pleurodynia: Secondary | ICD-10-CM | POA: Diagnosis not present

## 2020-10-20 DIAGNOSIS — M1712 Unilateral primary osteoarthritis, left knee: Secondary | ICD-10-CM | POA: Diagnosis not present

## 2020-10-22 ENCOUNTER — Encounter (HOSPITAL_COMMUNITY)
Admission: RE | Admit: 2020-10-22 | Discharge: 2020-10-22 | Disposition: A | Discharge status: Home / Self Care | Payer: Medicare HMO | Source: Ambulatory Visit | Attending: Internal Medicine | Admitting: Internal Medicine

## 2020-10-22 ENCOUNTER — Other Ambulatory Visit: Payer: Self-pay

## 2020-10-22 DIAGNOSIS — D631 Anemia in chronic kidney disease: Secondary | ICD-10-CM | POA: Diagnosis not present

## 2020-10-22 DIAGNOSIS — N185 Chronic kidney disease, stage 5: Secondary | ICD-10-CM | POA: Diagnosis not present

## 2020-10-22 LAB — RENAL FUNCTION PANEL
Albumin: 3.8 g/dL (ref 3.5–5.0)
Anion gap: 11 (ref 5–15)
BUN: 27 mg/dL — ABNORMAL HIGH (ref 8–23)
CO2: 24 mmol/L (ref 22–32)
Calcium: 9.2 mg/dL (ref 8.9–10.3)
Chloride: 105 mmol/L (ref 98–111)
Creatinine, Ser: 1.72 mg/dL — ABNORMAL HIGH (ref 0.44–1.00)
GFR, Estimated: 31 mL/min — ABNORMAL LOW (ref >60)
Glucose, Bld: 207 mg/dL — ABNORMAL HIGH (ref 70–99)
Phosphorus: 3.6 mg/dL (ref 2.5–4.6)
Potassium: 3.1 mmol/L — ABNORMAL LOW (ref 3.5–5.1)
Sodium: 140 mmol/L (ref 135–145)

## 2020-10-22 LAB — IRON AND TIBC
Iron: 57 ug/dL (ref 28–170)
Saturation Ratios: 26 % (ref 10.4–31.8)
TIBC: 217 ug/dL — ABNORMAL LOW (ref 250–450)
UIBC: 160 ug/dL

## 2020-10-22 LAB — FERRITIN: Ferritin: 457 ng/mL — ABNORMAL HIGH (ref 11–307)

## 2020-10-22 LAB — BASIC METABOLIC PANEL
Anion gap: 11 (ref 5–15)
BUN: 27 mg/dL — ABNORMAL HIGH (ref 8–23)
CO2: 24 mmol/L (ref 22–32)
Calcium: 9.3 mg/dL (ref 8.9–10.3)
Chloride: 105 mmol/L (ref 98–111)
Creatinine, Ser: 1.69 mg/dL — ABNORMAL HIGH (ref 0.44–1.00)
GFR, Estimated: 31 mL/min — ABNORMAL LOW (ref >60)
Glucose, Bld: 206 mg/dL — ABNORMAL HIGH (ref 70–99)
Potassium: 3.1 mmol/L — ABNORMAL LOW (ref 3.5–5.1)
Sodium: 140 mmol/L (ref 135–145)

## 2020-10-22 LAB — POCT HEMOGLOBIN-HEMACUE: Hemoglobin: 10.7 g/dL — ABNORMAL LOW (ref 12.0–15.0)

## 2020-10-22 MED ORDER — EPOETIN ALFA-EPBX 10000 UNIT/ML IJ SOLN
10000.0000 [IU] | Freq: Once | INTRAMUSCULAR | Status: AC
  Administered 2020-10-22: 10000 [IU] via SUBCUTANEOUS

## 2020-10-22 MED ORDER — EPOETIN ALFA-EPBX 10000 UNIT/ML IJ SOLN
INTRAMUSCULAR | Status: AC
  Filled 2020-10-22: qty 1

## 2020-10-23 DIAGNOSIS — E538 Deficiency of other specified B group vitamins: Secondary | ICD-10-CM | POA: Diagnosis not present

## 2020-10-23 LAB — PTH, INTACT AND CALCIUM
Calcium, Total (PTH): 9.3 mg/dL (ref 8.7–10.3)
PTH: 49 pg/mL (ref 15–65)

## 2020-10-28 DIAGNOSIS — M1712 Unilateral primary osteoarthritis, left knee: Secondary | ICD-10-CM | POA: Diagnosis not present

## 2020-10-29 DIAGNOSIS — N185 Chronic kidney disease, stage 5: Secondary | ICD-10-CM | POA: Diagnosis not present

## 2020-10-29 DIAGNOSIS — E1122 Type 2 diabetes mellitus with diabetic chronic kidney disease: Secondary | ICD-10-CM | POA: Diagnosis not present

## 2020-10-29 DIAGNOSIS — E876 Hypokalemia: Secondary | ICD-10-CM | POA: Diagnosis not present

## 2020-10-29 DIAGNOSIS — N119 Chronic tubulo-interstitial nephritis, unspecified: Secondary | ICD-10-CM | POA: Diagnosis not present

## 2020-10-29 DIAGNOSIS — I120 Hypertensive chronic kidney disease with stage 5 chronic kidney disease or end stage renal disease: Secondary | ICD-10-CM | POA: Diagnosis not present

## 2020-10-29 DIAGNOSIS — D631 Anemia in chronic kidney disease: Secondary | ICD-10-CM | POA: Diagnosis not present

## 2020-10-29 DIAGNOSIS — N281 Cyst of kidney, acquired: Secondary | ICD-10-CM | POA: Diagnosis not present

## 2020-11-03 ENCOUNTER — Other Ambulatory Visit: Payer: Self-pay | Admitting: Gastroenterology

## 2020-11-03 ENCOUNTER — Other Ambulatory Visit: Payer: Self-pay | Admitting: Internal Medicine

## 2020-11-03 DIAGNOSIS — R1310 Dysphagia, unspecified: Secondary | ICD-10-CM | POA: Diagnosis not present

## 2020-11-03 DIAGNOSIS — N185 Chronic kidney disease, stage 5: Secondary | ICD-10-CM

## 2020-11-03 DIAGNOSIS — K220 Achalasia of cardia: Secondary | ICD-10-CM | POA: Diagnosis not present

## 2020-11-03 DIAGNOSIS — K219 Gastro-esophageal reflux disease without esophagitis: Secondary | ICD-10-CM | POA: Diagnosis not present

## 2020-11-04 DIAGNOSIS — M1712 Unilateral primary osteoarthritis, left knee: Secondary | ICD-10-CM | POA: Diagnosis not present

## 2020-11-06 ENCOUNTER — Other Ambulatory Visit: Payer: Self-pay | Admitting: Geriatric Medicine

## 2020-11-06 DIAGNOSIS — M8580 Other specified disorders of bone density and structure, unspecified site: Secondary | ICD-10-CM

## 2020-11-10 DIAGNOSIS — M1712 Unilateral primary osteoarthritis, left knee: Secondary | ICD-10-CM | POA: Diagnosis not present

## 2020-11-12 ENCOUNTER — Other Ambulatory Visit: Payer: Self-pay | Admitting: Geriatric Medicine

## 2020-11-12 DIAGNOSIS — Z1231 Encounter for screening mammogram for malignant neoplasm of breast: Secondary | ICD-10-CM

## 2020-11-13 ENCOUNTER — Other Ambulatory Visit: Payer: Self-pay

## 2020-11-13 ENCOUNTER — Ambulatory Visit
Admission: RE | Admit: 2020-11-13 | Discharge: 2020-11-13 | Disposition: A | Discharge status: Home / Self Care | Payer: Medicare HMO | Source: Ambulatory Visit | Attending: Geriatric Medicine | Admitting: Geriatric Medicine

## 2020-11-13 DIAGNOSIS — Z780 Asymptomatic menopausal state: Secondary | ICD-10-CM | POA: Diagnosis not present

## 2020-11-13 DIAGNOSIS — M8580 Other specified disorders of bone density and structure, unspecified site: Secondary | ICD-10-CM

## 2020-11-13 DIAGNOSIS — M810 Age-related osteoporosis without current pathological fracture: Secondary | ICD-10-CM | POA: Diagnosis not present

## 2020-11-18 DIAGNOSIS — E1121 Type 2 diabetes mellitus with diabetic nephropathy: Secondary | ICD-10-CM | POA: Diagnosis not present

## 2020-11-18 DIAGNOSIS — K219 Gastro-esophageal reflux disease without esophagitis: Secondary | ICD-10-CM | POA: Diagnosis not present

## 2020-11-18 DIAGNOSIS — E7800 Pure hypercholesterolemia, unspecified: Secondary | ICD-10-CM | POA: Diagnosis not present

## 2020-11-18 DIAGNOSIS — I129 Hypertensive chronic kidney disease with stage 1 through stage 4 chronic kidney disease, or unspecified chronic kidney disease: Secondary | ICD-10-CM | POA: Diagnosis not present

## 2020-11-18 DIAGNOSIS — M810 Age-related osteoporosis without current pathological fracture: Secondary | ICD-10-CM | POA: Diagnosis not present

## 2020-11-18 DIAGNOSIS — N184 Chronic kidney disease, stage 4 (severe): Secondary | ICD-10-CM | POA: Diagnosis not present

## 2020-11-19 ENCOUNTER — Encounter (HOSPITAL_COMMUNITY)
Admission: RE | Admit: 2020-11-19 | Discharge: 2020-11-19 | Disposition: A | Discharge status: Home / Self Care | Payer: Medicare HMO | Source: Ambulatory Visit | Attending: Internal Medicine | Admitting: Internal Medicine

## 2020-11-19 ENCOUNTER — Other Ambulatory Visit: Payer: Self-pay

## 2020-11-19 DIAGNOSIS — D631 Anemia in chronic kidney disease: Secondary | ICD-10-CM | POA: Diagnosis present

## 2020-11-19 DIAGNOSIS — N185 Chronic kidney disease, stage 5: Secondary | ICD-10-CM | POA: Insufficient documentation

## 2020-11-19 LAB — POCT HEMOGLOBIN-HEMACUE: Hemoglobin: 11.3 g/dL — ABNORMAL LOW (ref 12.0–15.0)

## 2020-11-19 LAB — IRON AND TIBC
Iron: 65 ug/dL (ref 28–170)
Saturation Ratios: 28 % (ref 10.4–31.8)
TIBC: 234 ug/dL — ABNORMAL LOW (ref 250–450)
UIBC: 169 ug/dL

## 2020-11-19 LAB — FERRITIN: Ferritin: 504 ng/mL — ABNORMAL HIGH (ref 11–307)

## 2020-11-19 MED ORDER — EPOETIN ALFA-EPBX 10000 UNIT/ML IJ SOLN
10000.0000 [IU] | Freq: Once | INTRAMUSCULAR | Status: AC
  Administered 2020-11-19: 10000 [IU] via SUBCUTANEOUS
  Filled 2020-11-19: qty 1

## 2020-11-20 ENCOUNTER — Ambulatory Visit
Admission: RE | Admit: 2020-11-20 | Discharge: 2020-11-20 | Disposition: A | Discharge status: Home / Self Care | Payer: Medicare HMO | Source: Ambulatory Visit | Attending: Internal Medicine | Admitting: Internal Medicine

## 2020-11-20 DIAGNOSIS — N185 Chronic kidney disease, stage 5: Secondary | ICD-10-CM

## 2020-11-20 DIAGNOSIS — N189 Chronic kidney disease, unspecified: Secondary | ICD-10-CM | POA: Diagnosis not present

## 2020-11-20 DIAGNOSIS — N281 Cyst of kidney, acquired: Secondary | ICD-10-CM | POA: Diagnosis not present

## 2020-11-24 DIAGNOSIS — I129 Hypertensive chronic kidney disease with stage 1 through stage 4 chronic kidney disease, or unspecified chronic kidney disease: Secondary | ICD-10-CM | POA: Diagnosis not present

## 2020-11-24 DIAGNOSIS — N184 Chronic kidney disease, stage 4 (severe): Secondary | ICD-10-CM | POA: Diagnosis not present

## 2020-11-24 DIAGNOSIS — E538 Deficiency of other specified B group vitamins: Secondary | ICD-10-CM | POA: Diagnosis not present

## 2020-11-24 DIAGNOSIS — M810 Age-related osteoporosis without current pathological fracture: Secondary | ICD-10-CM | POA: Diagnosis not present

## 2020-12-17 ENCOUNTER — Encounter (HOSPITAL_COMMUNITY)
Admission: RE | Admit: 2020-12-17 | Discharge: 2020-12-17 | Disposition: A | Discharge status: Home / Self Care | Payer: Medicare HMO | Source: Ambulatory Visit | Attending: Internal Medicine | Admitting: Internal Medicine

## 2020-12-24 ENCOUNTER — Encounter (HOSPITAL_COMMUNITY): Payer: Self-pay

## 2020-12-24 ENCOUNTER — Other Ambulatory Visit: Payer: Self-pay

## 2020-12-24 ENCOUNTER — Encounter (HOSPITAL_COMMUNITY)
Admission: RE | Admit: 2020-12-24 | Discharge: 2020-12-24 | Disposition: A | Discharge status: Home / Self Care | Payer: Medicare HMO | Source: Ambulatory Visit | Attending: Internal Medicine | Admitting: Internal Medicine

## 2020-12-24 DIAGNOSIS — N185 Chronic kidney disease, stage 5: Secondary | ICD-10-CM | POA: Diagnosis not present

## 2020-12-24 DIAGNOSIS — D631 Anemia in chronic kidney disease: Secondary | ICD-10-CM | POA: Insufficient documentation

## 2020-12-24 LAB — RENAL FUNCTION PANEL
Albumin: 3.4 g/dL — ABNORMAL LOW (ref 3.5–5.0)
Anion gap: 10 (ref 5–15)
BUN: 21 mg/dL (ref 8–23)
CO2: 22 mmol/L (ref 22–32)
Calcium: 8.9 mg/dL (ref 8.9–10.3)
Chloride: 105 mmol/L (ref 98–111)
Creatinine, Ser: 1.7 mg/dL — ABNORMAL HIGH (ref 0.44–1.00)
GFR, Estimated: 31 mL/min — ABNORMAL LOW (ref >60)
Glucose, Bld: 277 mg/dL — ABNORMAL HIGH (ref 70–99)
Phosphorus: 3.8 mg/dL (ref 2.5–4.6)
Potassium: 4.1 mmol/L (ref 3.5–5.1)
Sodium: 137 mmol/L (ref 135–145)

## 2020-12-24 LAB — IRON AND TIBC
Iron: 64 ug/dL (ref 28–170)
Saturation Ratios: 32 % — ABNORMAL HIGH (ref 10.4–31.8)
TIBC: 197 ug/dL — ABNORMAL LOW (ref 250–450)
UIBC: 133 ug/dL

## 2020-12-24 LAB — FERRITIN: Ferritin: 579 ng/mL — ABNORMAL HIGH (ref 11–307)

## 2020-12-24 LAB — POCT HEMOGLOBIN-HEMACUE: Hemoglobin: 10.8 g/dL — ABNORMAL LOW (ref 12.0–15.0)

## 2020-12-24 MED ORDER — EPOETIN ALFA-EPBX 10000 UNIT/ML IJ SOLN
10000.0000 [IU] | Freq: Once | INTRAMUSCULAR | Status: AC
  Administered 2020-12-24: 10000 [IU] via SUBCUTANEOUS
  Filled 2020-12-24: qty 1

## 2020-12-25 DIAGNOSIS — E538 Deficiency of other specified B group vitamins: Secondary | ICD-10-CM | POA: Diagnosis not present

## 2020-12-25 LAB — PTH, INTACT AND CALCIUM
Calcium, Total (PTH): 9.3 mg/dL (ref 8.7–10.3)
PTH: 35 pg/mL (ref 15–65)

## 2020-12-31 ENCOUNTER — Ambulatory Visit (HOSPITAL_COMMUNITY): Admit: 2020-12-31 | Payer: Medicare HMO | Admitting: Gastroenterology

## 2020-12-31 ENCOUNTER — Encounter (HOSPITAL_COMMUNITY): Payer: Self-pay

## 2020-12-31 SURGERY — ESOPHAGOGASTRODUODENOSCOPY (EGD) WITH PROPOFOL
Anesthesia: Monitor Anesthesia Care | Laterality: Bilateral

## 2021-01-11 DIAGNOSIS — I129 Hypertensive chronic kidney disease with stage 1 through stage 4 chronic kidney disease, or unspecified chronic kidney disease: Secondary | ICD-10-CM | POA: Diagnosis not present

## 2021-01-11 DIAGNOSIS — K219 Gastro-esophageal reflux disease without esophagitis: Secondary | ICD-10-CM | POA: Diagnosis not present

## 2021-01-11 DIAGNOSIS — N184 Chronic kidney disease, stage 4 (severe): Secondary | ICD-10-CM | POA: Diagnosis not present

## 2021-01-11 DIAGNOSIS — M810 Age-related osteoporosis without current pathological fracture: Secondary | ICD-10-CM | POA: Diagnosis not present

## 2021-01-11 DIAGNOSIS — E7800 Pure hypercholesterolemia, unspecified: Secondary | ICD-10-CM | POA: Diagnosis not present

## 2021-01-11 DIAGNOSIS — E1121 Type 2 diabetes mellitus with diabetic nephropathy: Secondary | ICD-10-CM | POA: Diagnosis not present

## 2021-01-19 DIAGNOSIS — H5200 Hypermetropia, unspecified eye: Secondary | ICD-10-CM | POA: Diagnosis not present

## 2021-01-21 ENCOUNTER — Encounter (HOSPITAL_COMMUNITY): Payer: Self-pay

## 2021-01-21 ENCOUNTER — Other Ambulatory Visit: Payer: Self-pay

## 2021-01-21 ENCOUNTER — Encounter (HOSPITAL_COMMUNITY)
Admission: RE | Admit: 2021-01-21 | Discharge: 2021-01-21 | Disposition: A | Discharge status: Home / Self Care | Payer: Medicare HMO | Source: Ambulatory Visit | Attending: Internal Medicine | Admitting: Internal Medicine

## 2021-01-21 DIAGNOSIS — D631 Anemia in chronic kidney disease: Secondary | ICD-10-CM | POA: Diagnosis not present

## 2021-01-21 DIAGNOSIS — N185 Chronic kidney disease, stage 5: Secondary | ICD-10-CM | POA: Insufficient documentation

## 2021-01-21 LAB — IRON AND TIBC
Iron: 82 ug/dL (ref 28–170)
Saturation Ratios: 34 % — ABNORMAL HIGH (ref 10.4–31.8)
TIBC: 243 ug/dL — ABNORMAL LOW (ref 250–450)
UIBC: 161 ug/dL

## 2021-01-21 LAB — FERRITIN: Ferritin: 464 ng/mL — ABNORMAL HIGH (ref 11–307)

## 2021-01-21 LAB — POCT HEMOGLOBIN-HEMACUE: Hemoglobin: 11.6 g/dL — ABNORMAL LOW (ref 12.0–15.0)

## 2021-01-21 MED ORDER — EPOETIN ALFA-EPBX 10000 UNIT/ML IJ SOLN
INTRAMUSCULAR | Status: AC
  Administered 2021-01-21: 10000 [IU] via SUBCUTANEOUS
  Filled 2021-01-21: qty 1

## 2021-01-21 MED ORDER — EPOETIN ALFA-EPBX 10000 UNIT/ML IJ SOLN
10000.0000 [IU] | Freq: Once | INTRAMUSCULAR | Status: DC
Start: 2021-01-21 — End: 2021-01-21

## 2021-01-26 DIAGNOSIS — E538 Deficiency of other specified B group vitamins: Secondary | ICD-10-CM | POA: Diagnosis not present

## 2021-02-03 DIAGNOSIS — K220 Achalasia of cardia: Secondary | ICD-10-CM | POA: Diagnosis not present

## 2021-02-03 DIAGNOSIS — R1310 Dysphagia, unspecified: Secondary | ICD-10-CM | POA: Diagnosis not present

## 2021-02-10 DIAGNOSIS — Z0100 Encounter for examination of eyes and vision without abnormal findings: Secondary | ICD-10-CM | POA: Diagnosis not present

## 2021-02-18 ENCOUNTER — Other Ambulatory Visit: Payer: Self-pay

## 2021-02-18 ENCOUNTER — Encounter (HOSPITAL_COMMUNITY)
Admission: RE | Admit: 2021-02-18 | Discharge: 2021-02-18 | Disposition: A | Discharge status: Home / Self Care | Payer: Medicare HMO | Source: Ambulatory Visit | Attending: Internal Medicine | Admitting: Internal Medicine

## 2021-02-18 ENCOUNTER — Encounter (HOSPITAL_COMMUNITY): Payer: Self-pay

## 2021-02-18 DIAGNOSIS — D631 Anemia in chronic kidney disease: Secondary | ICD-10-CM | POA: Diagnosis not present

## 2021-02-18 DIAGNOSIS — N185 Chronic kidney disease, stage 5: Secondary | ICD-10-CM | POA: Diagnosis present

## 2021-02-18 LAB — FERRITIN: Ferritin: 487 ng/mL — ABNORMAL HIGH (ref 11–307)

## 2021-02-18 LAB — RENAL FUNCTION PANEL
Albumin: 3.9 g/dL (ref 3.5–5.0)
Anion gap: 11 (ref 5–15)
BUN: 27 mg/dL — ABNORMAL HIGH (ref 8–23)
CO2: 23 mmol/L (ref 22–32)
Calcium: 9.1 mg/dL (ref 8.9–10.3)
Chloride: 102 mmol/L (ref 98–111)
Creatinine, Ser: 2.18 mg/dL — ABNORMAL HIGH (ref 0.44–1.00)
GFR, Estimated: 23 mL/min — ABNORMAL LOW (ref >60)
Glucose, Bld: 206 mg/dL — ABNORMAL HIGH (ref 70–99)
Phosphorus: 4 mg/dL (ref 2.5–4.6)
Potassium: 3.2 mmol/L — ABNORMAL LOW (ref 3.5–5.1)
Sodium: 136 mmol/L (ref 135–145)

## 2021-02-18 LAB — POCT HEMOGLOBIN-HEMACUE: Hemoglobin: 11.3 g/dL — ABNORMAL LOW (ref 12.0–15.0)

## 2021-02-18 LAB — IRON AND TIBC
Iron: 59 ug/dL (ref 28–170)
Saturation Ratios: 26 % (ref 10.4–31.8)
TIBC: 231 ug/dL — ABNORMAL LOW (ref 250–450)
UIBC: 172 ug/dL

## 2021-02-18 MED ORDER — EPOETIN ALFA-EPBX 10000 UNIT/ML IJ SOLN
INTRAMUSCULAR | Status: AC
  Administered 2021-02-18: 10000 [IU]
  Filled 2021-02-18: qty 1

## 2021-02-18 MED ORDER — EPOETIN ALFA-EPBX 10000 UNIT/ML IJ SOLN: 10000.0000 [IU] | Freq: Once | INTRAMUSCULAR | Status: DC

## 2021-02-19 LAB — PTH, INTACT AND CALCIUM
Calcium, Total (PTH): 9.4 mg/dL (ref 8.7–10.3)
PTH: 50 pg/mL (ref 15–65)

## 2021-02-20 ENCOUNTER — Encounter (HOSPITAL_COMMUNITY): Payer: Self-pay | Admitting: Gastroenterology

## 2021-02-20 NOTE — Progress Notes (Signed)
Attempted to obtain medical history via telephone, unable to reach at this time. I left a voicemail to return pre surgical testing department's phone call.  

## 2021-02-25 DIAGNOSIS — Z880 Allergy status to penicillin: Secondary | ICD-10-CM | POA: Diagnosis not present

## 2021-02-25 DIAGNOSIS — Z7984 Long term (current) use of oral hypoglycemic drugs: Secondary | ICD-10-CM | POA: Diagnosis not present

## 2021-02-25 DIAGNOSIS — E785 Hyperlipidemia, unspecified: Secondary | ICD-10-CM | POA: Diagnosis not present

## 2021-02-25 DIAGNOSIS — E119 Type 2 diabetes mellitus without complications: Secondary | ICD-10-CM | POA: Diagnosis not present

## 2021-02-25 DIAGNOSIS — K219 Gastro-esophageal reflux disease without esophagitis: Secondary | ICD-10-CM | POA: Diagnosis not present

## 2021-02-25 DIAGNOSIS — E669 Obesity, unspecified: Secondary | ICD-10-CM | POA: Diagnosis not present

## 2021-02-25 DIAGNOSIS — I10 Essential (primary) hypertension: Secondary | ICD-10-CM | POA: Diagnosis not present

## 2021-02-26 ENCOUNTER — Other Ambulatory Visit: Payer: Self-pay

## 2021-02-26 ENCOUNTER — Ambulatory Visit
Admission: RE | Admit: 2021-02-26 | Discharge: 2021-02-26 | Disposition: A | Discharge status: Home / Self Care | Payer: Medicare HMO | Source: Ambulatory Visit | Attending: Geriatric Medicine | Admitting: Geriatric Medicine

## 2021-02-26 DIAGNOSIS — Z1231 Encounter for screening mammogram for malignant neoplasm of breast: Secondary | ICD-10-CM

## 2021-02-26 DIAGNOSIS — E538 Deficiency of other specified B group vitamins: Secondary | ICD-10-CM | POA: Diagnosis not present

## 2021-03-02 DIAGNOSIS — Z79899 Other long term (current) drug therapy: Secondary | ICD-10-CM | POA: Diagnosis not present

## 2021-03-02 DIAGNOSIS — Z1389 Encounter for screening for other disorder: Secondary | ICD-10-CM | POA: Diagnosis not present

## 2021-03-02 DIAGNOSIS — N184 Chronic kidney disease, stage 4 (severe): Secondary | ICD-10-CM | POA: Diagnosis not present

## 2021-03-02 DIAGNOSIS — E7800 Pure hypercholesterolemia, unspecified: Secondary | ICD-10-CM | POA: Diagnosis not present

## 2021-03-02 DIAGNOSIS — Z0000 Encounter for general adult medical examination without abnormal findings: Secondary | ICD-10-CM | POA: Diagnosis not present

## 2021-03-02 DIAGNOSIS — I700 Atherosclerosis of aorta: Secondary | ICD-10-CM | POA: Diagnosis not present

## 2021-03-02 DIAGNOSIS — Z1331 Encounter for screening for depression: Secondary | ICD-10-CM | POA: Diagnosis not present

## 2021-03-02 DIAGNOSIS — K220 Achalasia of cardia: Secondary | ICD-10-CM | POA: Diagnosis not present

## 2021-03-02 DIAGNOSIS — E1121 Type 2 diabetes mellitus with diabetic nephropathy: Secondary | ICD-10-CM | POA: Diagnosis not present

## 2021-03-02 DIAGNOSIS — I129 Hypertensive chronic kidney disease with stage 1 through stage 4 chronic kidney disease, or unspecified chronic kidney disease: Secondary | ICD-10-CM | POA: Diagnosis not present

## 2021-03-02 NOTE — Anesthesia Preprocedure Evaluation (Addendum)
Anesthesia Evaluation  Patient identified by MRN, date of birth, ID band Patient awake    Reviewed: Allergy & Precautions, H&P , NPO status , Patient's Chart, lab work & pertinent test results  History of Anesthesia Complications Negative for: history of anesthetic complications  Airway Mallampati: II  TM Distance: >3 FB Neck ROM: Full    Dental  (+) Partial Upper, Dental Advisory Given   Pulmonary neg pulmonary ROS,    Pulmonary exam normal        Cardiovascular hypertension, Pt. on medications and Pt. on home beta blockers Normal cardiovascular exam+ Valvular Problems/Murmurs  + Systolic murmurs IMPRESSIONS    1. Normal LV systolic function; grade 1 diastolic dysfunction; mild LVH;  mild AS (mean gradient 16 mmHg); small pericardial effusion.  2. Left ventricular ejection fraction, by estimation, is 60 to 65%. The  left ventricle has normal function. The left ventricle has no regional  wall motion abnormalities. There is mild left ventricular hypertrophy.  Left ventricular diastolic parameters  are consistent with Grade I diastolic dysfunction (impaired relaxation).  Elevated left atrial pressure.  3. Right ventricular systolic function is normal. The right ventricular  size is normal. There is normal pulmonary artery systolic pressure.  4. The mitral valve is normal in structure. Trivial mitral valve  regurgitation. No evidence of mitral stenosis.  5. The aortic valve is tricuspid. Aortic valve regurgitation is not  visualized. No aortic stenosis is present.  6. The inferior vena cava is normal in size with greater than 50%  respiratory variability, suggesting right atrial pressure of 3 mmHg.    Neuro/Psych Anxiety negative neurological ROS     GI/Hepatic Neg liver ROS, GERD  Medicated and Controlled,  Endo/Other  diabetes, Type 2, Oral Hypoglycemic Agents  Renal/GU ESRFRenal disease  negative genitourinary    Musculoskeletal  (+) Arthritis , Osteoarthritis,    Abdominal   Peds  Hematology  (+) Blood dyscrasia, anemia ,   Anesthesia Other Findings   Reproductive/Obstetrics negative OB ROS                            Anesthesia Physical  Anesthesia Plan  ASA: 3  Anesthesia Plan: MAC   Post-op Pain Management:    Induction:   PONV Risk Score and Plan: 2 and Propofol infusion and Ondansetron  Airway Management Planned: Simple Face Mask, Natural Airway and Nasal Cannula  Additional Equipment:   Intra-op Plan:   Post-operative Plan:   Informed Consent: I have reviewed the patients History and Physical, chart, labs and discussed the procedure including the risks, benefits and alternatives for the proposed anesthesia with the patient or authorized representative who has indicated his/her understanding and acceptance.       Plan Discussed with: Anesthesiologist  Anesthesia Plan Comments: ( )       Anesthesia Quick Evaluation

## 2021-03-02 NOTE — H&P (Signed)
History of Present Illness  General:          75 year old female, history of CKD, DM, HTN, and achalasia, presents for follow up of dysphagia.        Last seen 6/6        Was scheduled for EGD w/botox injection due to hx of achalasia at [facility] 12/31/20. This had to be canceled due to patient contracting COVID.         Diagnosed in 2017 by Dr. Wynetta Emery with achalasia. Had endoscopy with empiric dilation which did not seem to help. Then had esophageal manometry with a diagnosis of achalasia, then in January 2018 had Botox injection with some improvement of her symptoms.         Overall she did fine with swallowing until 2021 when she started noticing food sticking and saw Dr. Penelope Coop 08/2019. Dr. Wynetta Emery previously mentioned sending her to [facility] but it was never pursued. Dr. Penelope Coop then referred her to [facility] and discussed treatment including dilation, Botox, and POEM. This refferall was also never pursued. She states she is not interested in going to [place] at this time. She would like another botox injection as that helped her significantly in the past.        Endorsed worsening dysphagia over the last couple of years to solids and liquids. Kidney specialist prefers patient have botox instead of cutting LES since she cannot be put on PPI due to CKD.         just about anything she consumes - solids or liquids - gets stuck. sometimes almost has to vomit things back up.        denies melena/hematochezia        denies abdominal pain        denies unintentional weight loss        denies constipation/diarrhea        denies FH colon cancer        colonoscopy 08/2011, normal, repeat 10 years. Due 08/2021.     Current Medications  Taking  Klor-Con(Potassium Chloride) 20 MEQ Packet 1 tablet Orally Once a day  amLODIPine Besylate 10 MG Tablet Take 1 tablet by mouth once daily   Fluticasone Propionate 50 MCG/ACT Suspension 2 sprays in each nostril Nasally Once a day Tessalon Perles(Benzonatate) 100 MG  Capsule 1 capsule Orally Three times a day as needed for cough Vitamin C 1000 MG Tablet 1 tablet Orally twice a day Furosemide 40 MG Tablet TAKE 1 TABLET BY MOUTH TWICE DAILY Orally  Metoprolol Succinate ER 50 MG Tablet Extended Release 24 Hour 1 tablet Orally Once a day Atorvastatin Calcium 20 MG Tablet Take 1 tablet by mouth once daily  glipiZIDE 5 MG Tablet 1 tablet Orally Twice daily with food Salonpas Pain Relief Patch(Liniments) - Patch as directed Externally as needed ZyrTEC(Cetirizine HCl) 10 MG Tablet 1 tablet Orally Once a day traMADol HCl 50 MG Tablet 1 tablet as needed Orally every 6 hours, Notes: only takes 1/2 tab PRN One Touch Ultra Blue test strips Test Strips To check blood sugars Dx: E11.21 Finger stick 2-3 times daily Biotin 10000 MCG Tablet 1 tablet Orally Once a day One Touch Ultra Blue Glucometer glucometer To check blood sugars Dx: E11.21 2-3 times daily One Touch Ultra Blue Lancets Lancets To check blood sugars Dx: E11.21 Finger stick 2-3 times daily Pepcid AC(Famotidine) 10 MG Tablet 1 tablet Orally Twice a day Turmeric 500 MG Capsule 2 tabs Orally daily Xanax(ALPRAZolam) 0.25 MG Tablet 1  tablet Orally twice daily only as needed, Notes: rarely uses    Past Medical History        Diabetes.      Htn.      Djd knees.      ortho Dr Micheline Chapman.      opthal Dr Jorja Loa.      Hypercholesterolemia goal ldl less 100.      Microalbuminuria 01/2010.      right femoral frx: [facility]mad[facility] Dr Doran Durand.      Esophageal spasm 2008 barium swallow and april 2013 botox injection 05/2016.      01/2011 mild aortic stenosis repeat echo 03/2014 no stenosis normal echo 09/2019.      01/2011 anemia iron deficiency stool cards neg 03/2014.      B12 deficiency 01/2011.      Diverticulosis barium enema 08/2011.      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      11/2013 decline in GFR hold hydrochlorothiazide-followup.      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      stage IV renal disease.      derm DR Ubaldo Glassing.      podiatry Dr Paulla Dolly.      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      12/2017 drop in GFR recheck in 6 weeks off hydrochlorothiazide.      Normocytic normochromic anemia hemoglobin 9.8 normal iron values 07/2018.      07/2018 right renal cyst follow-up 6 months ultrasound.      stage 4 renal diseas Dr Johnney Ou 07/2018.      elevated free light chain kappa Dr Vivien Rossetti 10/2018--Dr Irene Limbo.      Chronic interstitial nephritis on bx.      Atherosclerosis aorta on 11/2018.      RT hip fracture.      Osteoporosis 12/2021 declines prolia due to cost.     Surgical History        TAH BSO 1970        arthroscopic knee bilat         right femur frx Dr Doran Durand 04/2010        right femur revison Dr Doran Durand 12/2010        Fistula on May 5th 09/2019        ligation right cephalic vein due to steal syndrome from AV Fistula - Dr. Donnetta Hutching 09/2019      Family History  Father: deceased 65 yrs, MI, diagnosed with Coronary artery disease  Mother: deceased, diabetes, chf, copd, diagnosed with Diabetes  only child, has 2 living children one son comitted suicide after [employer] duty , daughter has diabetes, son has sleep apnea great uncle had leukemia No family hx of colon cancer, polyps or liver disease.    Social History  General:   Tobacco use  cigarettes:  Never smoked, Tobacco history last updated  02/03/2021, Additional Findings: Tobacco Non-User  Non-smoker for personal reasons. no EXPOSURE TO PASSIVE SMOKE. Alcohol: yes, occasionally wine. Caffeine: yes. no Recreational drug use. Marital Status: single, divorced and he later died. OCCUPATION: employed, Forensic psychologist. COMMUNICATION BARRIERS: visual impairment, wears glasses. no Smoking.      Allergies  Codeine Sulfate: itch - Side Effects  Augmentin: vomiting - Side Effects  Benzonatate: itching - Side Effects    Hospitalization/Major Diagnostic  Procedure  None in the past yr 12/2020    Review of Systems  GI PROCEDURE:  Pacemaker/ AICD no.  Artificial heart valves no.  MI/heart attack no.  Abnormal heart rhythm no.  Angina no.  CVA no.  Hypertension YES.  Hypotension no.  Asthma, COPD no.  Sleep apnea no.  Seizure disorders no.  Artificial joints no.  Severe DJD no.  Diabetes YES, type II.  Significant headaches no.  Vertigo no.  Depression/anxiety no.  Abnormal bleeding no.  Kidney Disease YES.  Liver disease no.  Chance of pregnancy no.  Blood transfusion no.     Vital Signs  Wt 193.4, Wt change 4.6 lb, Ht 64, BMI 33.19, Temp 97.7, Pulse sitting 74, BP sitting 130/74.     Examination  Gastroenterology Exam:        GENERAL APPEARANCE: Well developed, well nourished, no active distress, pleasant, no acute distress . EYES: Lids and conjunctiva normal. Sclera normal, pupils equal and reactive. SCLERA: anicteric. RESPIRATORY Breath sounds normal. Respiration even and unlabored. CARDIOVASCULAR grade 4 systolic murmur, normal rate.Marland Kitchen SKIN Warm and dry. PSYCHIATRIC Alert and oriented x3, mood and affect appear normal..     Assessments  Dysphagia, unspecified type - R13.10 (Primary)   Achalasia - K22.0    Treatment   1. Dysphagia, unspecified type         IMAGING: EGD w/ Directed Submucosal Injection(s) Clinical Notes: was previously scheduled for EGD/3/22, contracted Covid had to cancel. Continuing to have dysphagia to solids and liquids. Recommend EGD with Botox injection. She has has had this done previously in the past due to history of achalasia.  The procedure EGD was thoroughly explained to the patient, including the risks, benefits and alternatives. We discussed the complications including Bleeding and perforation, sedation problems and missing something. The patient verbalized understanding and agree.      2. Achalasia         IMAGING: EGD w/ Directed Submucosal Injection(s)

## 2021-03-03 ENCOUNTER — Other Ambulatory Visit: Payer: Self-pay

## 2021-03-03 ENCOUNTER — Ambulatory Visit (HOSPITAL_COMMUNITY): Payer: Medicare HMO | Admitting: Anesthesiology

## 2021-03-03 ENCOUNTER — Encounter (HOSPITAL_COMMUNITY)
Admission: RE | Disposition: A | Discharge status: Home / Self Care | Payer: Self-pay | Source: Home / Self Care | Attending: Gastroenterology

## 2021-03-03 ENCOUNTER — Ambulatory Visit (HOSPITAL_COMMUNITY)
Admission: RE | Admit: 2021-03-03 | Discharge: 2021-03-03 | Disposition: A | Discharge status: Home / Self Care | Payer: Medicare HMO | Attending: Gastroenterology | Admitting: Gastroenterology

## 2021-03-03 ENCOUNTER — Encounter (HOSPITAL_COMMUNITY): Payer: Self-pay | Admitting: Gastroenterology

## 2021-03-03 DIAGNOSIS — Z7984 Long term (current) use of oral hypoglycemic drugs: Secondary | ICD-10-CM | POA: Insufficient documentation

## 2021-03-03 DIAGNOSIS — Z8616 Personal history of COVID-19: Secondary | ICD-10-CM | POA: Diagnosis not present

## 2021-03-03 DIAGNOSIS — R1110 Vomiting, unspecified: Secondary | ICD-10-CM | POA: Diagnosis not present

## 2021-03-03 DIAGNOSIS — Z880 Allergy status to penicillin: Secondary | ICD-10-CM | POA: Insufficient documentation

## 2021-03-03 DIAGNOSIS — K220 Achalasia of cardia: Secondary | ICD-10-CM | POA: Insufficient documentation

## 2021-03-03 DIAGNOSIS — K3189 Other diseases of stomach and duodenum: Secondary | ICD-10-CM | POA: Diagnosis not present

## 2021-03-03 DIAGNOSIS — X58XXXA Exposure to other specified factors, initial encounter: Secondary | ICD-10-CM | POA: Insufficient documentation

## 2021-03-03 DIAGNOSIS — E1122 Type 2 diabetes mellitus with diabetic chronic kidney disease: Secondary | ICD-10-CM | POA: Diagnosis not present

## 2021-03-03 DIAGNOSIS — T18128A Food in esophagus causing other injury, initial encounter: Secondary | ICD-10-CM | POA: Insufficient documentation

## 2021-03-03 DIAGNOSIS — Z888 Allergy status to other drugs, medicaments and biological substances status: Secondary | ICD-10-CM | POA: Insufficient documentation

## 2021-03-03 DIAGNOSIS — Z79899 Other long term (current) drug therapy: Secondary | ICD-10-CM | POA: Diagnosis not present

## 2021-03-03 DIAGNOSIS — Z885 Allergy status to narcotic agent status: Secondary | ICD-10-CM | POA: Insufficient documentation

## 2021-03-03 DIAGNOSIS — R1310 Dysphagia, unspecified: Secondary | ICD-10-CM | POA: Diagnosis not present

## 2021-03-03 DIAGNOSIS — I129 Hypertensive chronic kidney disease with stage 1 through stage 4 chronic kidney disease, or unspecified chronic kidney disease: Secondary | ICD-10-CM | POA: Diagnosis not present

## 2021-03-03 DIAGNOSIS — N184 Chronic kidney disease, stage 4 (severe): Secondary | ICD-10-CM | POA: Diagnosis not present

## 2021-03-03 DIAGNOSIS — Q399 Congenital malformation of esophagus, unspecified: Secondary | ICD-10-CM | POA: Insufficient documentation

## 2021-03-03 DIAGNOSIS — K319 Disease of stomach and duodenum, unspecified: Secondary | ICD-10-CM | POA: Diagnosis not present

## 2021-03-03 HISTORY — PX: BALLOON DILATION: SHX5330

## 2021-03-03 HISTORY — PX: BIOPSY: SHX5522

## 2021-03-03 HISTORY — PX: ESOPHAGOGASTRODUODENOSCOPY (EGD) WITH PROPOFOL: SHX5813

## 2021-03-03 HISTORY — PX: SCLEROTHERAPY: SHX6841

## 2021-03-03 HISTORY — PX: FOREIGN BODY REMOVAL: SHX962

## 2021-03-03 HISTORY — PX: BOTOX INJECTION: SHX5754

## 2021-03-03 SURGERY — ESOPHAGOGASTRODUODENOSCOPY (EGD) WITH PROPOFOL
Anesthesia: General | Laterality: Bilateral

## 2021-03-03 MED ORDER — PROPOFOL 500 MG/50ML IV EMUL
INTRAVENOUS | Status: DC | PRN
  Administered 2021-03-03: 125 ug/kg/min via INTRAVENOUS

## 2021-03-03 MED ORDER — PROPOFOL 1000 MG/100ML IV EMUL
INTRAVENOUS | Status: AC
  Filled 2021-03-03: qty 100

## 2021-03-03 MED ORDER — SUCCINYLCHOLINE CHLORIDE 200 MG/10ML IV SOSY
PREFILLED_SYRINGE | INTRAVENOUS | Status: DC | PRN
  Administered 2021-03-03: 100 mg via INTRAVENOUS

## 2021-03-03 MED ORDER — LACTATED RINGERS IV SOLN: INTRAVENOUS | Status: DC | PRN

## 2021-03-03 MED ORDER — SODIUM CHLORIDE (PF) 0.9 % IJ SOLN
INTRAMUSCULAR | Status: DC | PRN
  Administered 2021-03-03: 4 mL via SUBMUCOSAL

## 2021-03-03 MED ORDER — PROPOFOL 500 MG/50ML IV EMUL
INTRAVENOUS | Status: AC
  Filled 2021-03-03: qty 50

## 2021-03-03 MED ORDER — ONDANSETRON HCL 4 MG/2ML IJ SOLN
INTRAMUSCULAR | Status: DC | PRN
  Administered 2021-03-03: 4 mg via INTRAVENOUS

## 2021-03-03 MED ORDER — ONABOTULINUMTOXINA 100 UNITS IJ SOLR
INTRAMUSCULAR | Status: AC
  Filled 2021-03-03: qty 100

## 2021-03-03 MED ORDER — PROPOFOL 10 MG/ML IV BOLUS
INTRAVENOUS | Status: DC | PRN
  Administered 2021-03-03: 50 mg via INTRAVENOUS
  Administered 2021-03-03: 10 mg via INTRAVENOUS

## 2021-03-03 SURGICAL SUPPLY — 14 items

## 2021-03-03 NOTE — Anesthesia Postprocedure Evaluation (Signed)
Anesthesia Post Note  Patient: Beth Malone  Procedure(s) Performed: ESOPHAGOGASTRODUODENOSCOPY (EGD) WITH PROPOFOL. (Bilateral) BALLOON DILATION (Bilateral) BOTOX INJECTION (Bilateral) BIOPSY FOREIGN BODY REMOVAL SCLEROTHERAPY     Patient location during evaluation: PACU Anesthesia Type: General Level of consciousness: sedated Pain management: pain level controlled Vital Signs Assessment: post-procedure vital signs reviewed and stable Respiratory status: spontaneous breathing and respiratory function stable Cardiovascular status: stable Postop Assessment: no apparent nausea or vomiting Anesthetic complications: no   No notable events documented.  Last Vitals:  Vitals:   03/03/21 0900 03/03/21 0911  BP: (!) 186/86 (!) 164/82  Pulse: 75 75  Resp: (!) 22 (!) 23  Temp:    SpO2: 98% 97%    Last Pain:  Vitals:   03/03/21 0900  TempSrc:   PainSc: 0-No pain                 Davis Vannatter DANIEL

## 2021-03-03 NOTE — Discharge Instructions (Signed)

## 2021-03-03 NOTE — Progress Notes (Signed)
Pt's BP this am 168/97 preprocedure. SBP in Endo RR post procedure 167-184/80s/90. Dr Tobias Alexander aware, ok to DC home

## 2021-03-03 NOTE — Anesthesia Procedure Notes (Signed)
Procedure Name: MAC Date/Time: 03/03/2021 8:06 AM Performed by: Niel Hummer, CRNA Pre-anesthesia Checklist: Patient identified, Emergency Drugs available, Suction available and Patient being monitored Oxygen Delivery Method: Simple face mask

## 2021-03-03 NOTE — Anesthesia Procedure Notes (Signed)
Procedure Name: Intubation Date/Time: 03/03/2021 8:17 AM Performed by: Niel Hummer, CRNA Pre-anesthesia Checklist: Patient identified, Emergency Drugs available, Suction available and Patient being monitored Patient Re-evaluated:Patient Re-evaluated prior to induction Oxygen Delivery Method: Circle system utilized Preoxygenation: Pre-oxygenation with 100% oxygen Induction Type: IV induction, Rapid sequence and Cricoid Pressure applied Laryngoscope Size: Mac and 4 Grade View: Grade I Tube type: Oral Tube size: 7.0 mm Number of attempts: 1 Airway Equipment and Method: Stylet Placement Confirmation: ETT inserted through vocal cords under direct vision, positive ETCO2 and breath sounds checked- equal and bilateral Secured at: 22 cm Tube secured with: Tape Dental Injury: Teeth and Oropharynx as per pre-operative assessment

## 2021-03-03 NOTE — Op Note (Signed)
[facility] Patient Name: Beth Malone Procedure Date: 03/03/2021 MRN: [id number] Attending MD: Ronnette Juniper , MD Date of Birth: 1945-07-08 CSN: [id number] Age: 75 Admit Type: Outpatient Procedure:                Upper GI endoscopy Indications:              Dysphagia, For botulinum toxin injection of                            achalasia Providers:                Ronnette Juniper, MD, Kary Kos, RN, Benetta Spar,                            Technician Referring MD:             Lajean Manes, MD Medicines:                Monitored Anesthesia Care Complications:            No immediate complications. Estimated blood loss:                            Minimal. Estimated Blood Loss:     Estimated blood loss was minimal. Procedure:                Pre-Anesthesia Assessment:                           - Prior to the procedure, a History and Physical                            was performed, and patient medications and                            allergies were reviewed. The patient's tolerance of                            previous anesthesia was also reviewed. The risks                            and benefits of the procedure and the sedation                            options and risks were discussed with the patient.                            All questions were answered, and informed consent                            was obtained. Prior Anticoagulants: The patient has                            taken no previous anticoagulant or antiplatelet                            agents. ASA Grade Assessment: III -  A patient with                            severe systemic disease. After reviewing the risks                            and benefits, the patient was deemed in                            satisfactory condition to undergo the procedure.                           After obtaining informed consent, the endoscope was                            passed under direct vision. Throughout the                             procedure, the patient's blood pressure, pulse, and                            oxygen saturations were monitored continuously. The                            [id number]-H190 ([id number] Olympus endoscope was introduced                            through the mouth, and advanced to the second part                            of duodenum. The upper GI endoscopy was                            accomplished without difficulty. The patient                            tolerated the procedure well. Scope In: Scope Out: Findings:      Food (yellow, undigested peels) was found in the middle third of the       esophagus and in the lower third of the esophagus. Removal of food was       accomplished with a roth's net. Since the patient vomited a small found       of retained food and bile, she was subsequently intubated for the rest       of the procedure.      The distal esophagus was significantly tortuous.      The Z-line was regular and was found 38 cm from the incisors. Biopsies       were obtained from the proximal and distal esophagus with cold forceps       for histology of suspected eosinophilic esophagitis.      Localized mild mucosal changes characterized by scarring (post ulcer)       were found in the gastric antrum. Biopsies were taken with a cold       forceps for Helicobacter pylori testing.      The cardia and gastric  fundus were normal on retroflexion.      The examined duodenum was normal.      Areas at 37 cm (1 cm above the GE junction) from the incisors were       successfully injected with 100 units botulinum toxin, 25 units/ml, 1 ml       in four quadrant fashion. Impression:               - Food in the middle third of the esophagus and in                            the lower third of the esophagus. Removal was                            successful.                           - Tortuous esophagus.                           - Z-line regular, 38 cm from the  incisors. Biopsied.                           - Scarred (post ulcer) mucosa in the antrum.                            Biopsied.                           - Normal examined duodenum.                           - Areas at 37 cm from incisors successfully                            injected with botulinum toxin. Moderate Sedation:      Patient did not receive moderate sedation for this procedure, but       instead received monitored anesthesia care. Recommendation:           - Patient has a contact number available for                            emergencies. The signs and symptoms of potential                            delayed complications were discussed with the                            patient. Return to normal activities tomorrow.                            Written discharge instructions were provided to the                            patient.                           -  Advance diet as tolerated.                           - Continue present medications.                           - Await pathology results.                           - Recommend POEM if botox does not help with                            dysphagia. Procedure Code(s):        --- Professional ---                           [phone], Esophagogastroduodenoscopy, flexible,                            transoral; with removal of foreign body(s)                           43239, Esophagogastroduodenoscopy, flexible,                            transoral; with biopsy, single or multiple                           43236, 32, Esophagogastroduodenoscopy, flexible,                            transoral; with directed submucosal injection(s),                            any substance Diagnosis Code(s):        --- Professional ---                           F09.323F, Food in esophagus causing other injury,                            initial encounter                           Q39.9, Congenital malformation of esophagus,                             unspecified                           K31.89, Other diseases of stomach and duodenum                           R13.10, Dysphagia, unspecified                           K22.0, Achalasia of cardia CPT copyright 2019 [employer]. All rights reserved. The codes documented in this report are preliminary and upon coder  review may  be revised to meet current compliance requirements. Ronnette Juniper, MD 03/03/2021 8:44:53 AM This report has been signed electronically. Number of Addenda: 0

## 2021-03-03 NOTE — Transfer of Care (Signed)
Immediate Anesthesia Transfer of Care Note  Patient: Marikay Roads  Procedure(s) Performed: ESOPHAGOGASTRODUODENOSCOPY (EGD) WITH PROPOFOL. (Bilateral) BALLOON DILATION (Bilateral) BOTOX INJECTION (Bilateral) BIOPSY FOREIGN BODY REMOVAL SCLEROTHERAPY  Patient Location: PACU  Anesthesia Type:General  Level of Consciousness: awake, alert  and oriented  Airway & Oxygen Therapy: Patient Spontanous Breathing and Patient connected to face mask oxygen  Post-op Assessment: Report given to RN, Post -op Vital signs reviewed and stable and Patient moving all extremities X 4  Post vital signs: Reviewed and stable  Last Vitals:  Vitals Value Taken Time  BP 167/88   Temp    Pulse 79   Resp 20   SpO2 100     Last Pain:  Vitals:   03/03/21 0653  TempSrc: Temporal  PainSc: 5          Complications: No notable events documented.

## 2021-03-04 ENCOUNTER — Encounter (HOSPITAL_COMMUNITY): Payer: Self-pay | Admitting: Gastroenterology

## 2021-03-04 LAB — SURGICAL PATHOLOGY

## 2021-03-12 DIAGNOSIS — D631 Anemia in chronic kidney disease: Secondary | ICD-10-CM | POA: Diagnosis not present

## 2021-03-12 DIAGNOSIS — N119 Chronic tubulo-interstitial nephritis, unspecified: Secondary | ICD-10-CM | POA: Diagnosis not present

## 2021-03-12 DIAGNOSIS — E1122 Type 2 diabetes mellitus with diabetic chronic kidney disease: Secondary | ICD-10-CM | POA: Diagnosis not present

## 2021-03-12 DIAGNOSIS — N184 Chronic kidney disease, stage 4 (severe): Secondary | ICD-10-CM | POA: Diagnosis not present

## 2021-03-12 DIAGNOSIS — E876 Hypokalemia: Secondary | ICD-10-CM | POA: Diagnosis not present

## 2021-03-12 DIAGNOSIS — I129 Hypertensive chronic kidney disease with stage 1 through stage 4 chronic kidney disease, or unspecified chronic kidney disease: Secondary | ICD-10-CM | POA: Diagnosis not present

## 2021-03-18 ENCOUNTER — Other Ambulatory Visit: Payer: Self-pay

## 2021-03-18 ENCOUNTER — Encounter (HOSPITAL_COMMUNITY)
Admission: RE | Admit: 2021-03-18 | Discharge: 2021-03-18 | Disposition: A | Discharge status: Home / Self Care | Payer: Medicare HMO | Source: Ambulatory Visit | Attending: Internal Medicine | Admitting: Internal Medicine

## 2021-03-18 VITALS — BP 142/64 | HR 78 | Temp 98.0°F | Resp 18

## 2021-03-18 DIAGNOSIS — D631 Anemia in chronic kidney disease: Secondary | ICD-10-CM | POA: Insufficient documentation

## 2021-03-18 DIAGNOSIS — N183 Chronic kidney disease, stage 3 unspecified: Secondary | ICD-10-CM

## 2021-03-18 DIAGNOSIS — N185 Chronic kidney disease, stage 5: Secondary | ICD-10-CM | POA: Diagnosis present

## 2021-03-18 DIAGNOSIS — E119 Type 2 diabetes mellitus without complications: Secondary | ICD-10-CM

## 2021-03-18 DIAGNOSIS — N289 Disorder of kidney and ureter, unspecified: Secondary | ICD-10-CM

## 2021-03-18 LAB — POCT HEMOGLOBIN-HEMACUE: Hemoglobin: 11.8 g/dL — ABNORMAL LOW (ref 12.0–15.0)

## 2021-03-18 LAB — BASIC METABOLIC PANEL
Anion gap: 7 (ref 5–15)
BUN: 25 mg/dL — ABNORMAL HIGH (ref 8–23)
CO2: 24 mmol/L (ref 22–32)
Calcium: 9.1 mg/dL (ref 8.9–10.3)
Chloride: 109 mmol/L (ref 98–111)
Creatinine, Ser: 1.94 mg/dL — ABNORMAL HIGH (ref 0.44–1.00)
GFR, Estimated: 27 mL/min — ABNORMAL LOW (ref >60)
Glucose, Bld: 120 mg/dL — ABNORMAL HIGH (ref 70–99)
Potassium: 4.3 mmol/L (ref 3.5–5.1)
Sodium: 140 mmol/L (ref 135–145)

## 2021-03-18 LAB — IRON AND TIBC
Iron: 77 ug/dL (ref 28–170)
Saturation Ratios: 33 % — ABNORMAL HIGH (ref 10.4–31.8)
TIBC: 233 ug/dL — ABNORMAL LOW (ref 250–450)
UIBC: 156 ug/dL

## 2021-03-18 LAB — FERRITIN: Ferritin: 482 ng/mL — ABNORMAL HIGH (ref 11–307)

## 2021-03-18 MED ORDER — EPOETIN ALFA-EPBX 10000 UNIT/ML IJ SOLN
10000.0000 [IU] | Freq: Once | INTRAMUSCULAR | Status: DC
  Administered 2021-03-18: 10000 [IU] via SUBCUTANEOUS

## 2021-03-18 MED ORDER — EPOETIN ALFA-EPBX 10000 UNIT/ML IJ SOLN
INTRAMUSCULAR | Status: AC
  Filled 2021-03-18: qty 1

## 2021-03-30 DIAGNOSIS — E538 Deficiency of other specified B group vitamins: Secondary | ICD-10-CM | POA: Diagnosis not present

## 2021-04-15 ENCOUNTER — Encounter (HOSPITAL_COMMUNITY)
Admission: RE | Admit: 2021-04-15 | Discharge: 2021-04-15 | Disposition: A | Discharge status: Home / Self Care | Payer: Medicare HMO | Source: Ambulatory Visit | Attending: Internal Medicine | Admitting: Internal Medicine

## 2021-04-15 ENCOUNTER — Other Ambulatory Visit: Payer: Self-pay

## 2021-04-15 DIAGNOSIS — N185 Chronic kidney disease, stage 5: Secondary | ICD-10-CM | POA: Diagnosis not present

## 2021-04-15 DIAGNOSIS — D631 Anemia in chronic kidney disease: Secondary | ICD-10-CM | POA: Insufficient documentation

## 2021-04-15 LAB — IRON AND TIBC
Iron: 61 ug/dL (ref 28–170)
Saturation Ratios: 28 % (ref 10.4–31.8)
TIBC: 219 ug/dL — ABNORMAL LOW (ref 250–450)
UIBC: 158 ug/dL

## 2021-04-15 LAB — FERRITIN: Ferritin: 408 ng/mL — ABNORMAL HIGH (ref 11–307)

## 2021-04-15 LAB — POCT HEMOGLOBIN-HEMACUE: Hemoglobin: 10.9 g/dL — ABNORMAL LOW (ref 12.0–15.0)

## 2021-04-15 MED ORDER — EPOETIN ALFA-EPBX 10000 UNIT/ML IJ SOLN
INTRAMUSCULAR | Status: AC
  Filled 2021-04-15: qty 1

## 2021-04-15 MED ORDER — EPOETIN ALFA-EPBX 10000 UNIT/ML IJ SOLN
10000.0000 [IU] | Freq: Once | INTRAMUSCULAR | Status: AC
  Administered 2021-04-15: 10000 [IU] via SUBCUTANEOUS

## 2021-04-20 DIAGNOSIS — M47816 Spondylosis without myelopathy or radiculopathy, lumbar region: Secondary | ICD-10-CM | POA: Diagnosis not present

## 2021-04-29 DIAGNOSIS — M5416 Radiculopathy, lumbar region: Secondary | ICD-10-CM | POA: Diagnosis not present

## 2021-05-07 DIAGNOSIS — K220 Achalasia of cardia: Secondary | ICD-10-CM | POA: Diagnosis not present

## 2021-05-07 DIAGNOSIS — E538 Deficiency of other specified B group vitamins: Secondary | ICD-10-CM | POA: Diagnosis not present

## 2021-05-07 DIAGNOSIS — K2100 Gastro-esophageal reflux disease with esophagitis, without bleeding: Secondary | ICD-10-CM | POA: Diagnosis not present

## 2021-05-12 DIAGNOSIS — M5136 Other intervertebral disc degeneration, lumbar region: Secondary | ICD-10-CM | POA: Diagnosis not present

## 2021-05-12 DIAGNOSIS — M47816 Spondylosis without myelopathy or radiculopathy, lumbar region: Secondary | ICD-10-CM | POA: Diagnosis not present

## 2021-05-13 ENCOUNTER — Encounter (HOSPITAL_COMMUNITY)
Admission: RE | Admit: 2021-05-13 | Discharge: 2021-05-13 | Disposition: A | Discharge status: Home / Self Care | Payer: Medicare HMO | Source: Ambulatory Visit | Attending: Internal Medicine | Admitting: Internal Medicine

## 2021-05-13 ENCOUNTER — Other Ambulatory Visit: Payer: Self-pay

## 2021-05-13 DIAGNOSIS — D631 Anemia in chronic kidney disease: Secondary | ICD-10-CM | POA: Diagnosis not present

## 2021-05-13 DIAGNOSIS — N185 Chronic kidney disease, stage 5: Secondary | ICD-10-CM | POA: Insufficient documentation

## 2021-05-13 LAB — IRON AND TIBC
Iron: 65 ug/dL (ref 28–170)
Saturation Ratios: 27 % (ref 10.4–31.8)
TIBC: 239 ug/dL — ABNORMAL LOW (ref 250–450)
UIBC: 174 ug/dL

## 2021-05-13 LAB — FERRITIN: Ferritin: 390 ng/mL — ABNORMAL HIGH (ref 11–307)

## 2021-05-13 LAB — RENAL FUNCTION PANEL
Albumin: 4 g/dL (ref 3.5–5.0)
Anion gap: 12 (ref 5–15)
BUN: 20 mg/dL (ref 8–23)
CO2: 21 mmol/L — ABNORMAL LOW (ref 22–32)
Calcium: 9.3 mg/dL (ref 8.9–10.3)
Chloride: 109 mmol/L (ref 98–111)
Creatinine, Ser: 1.59 mg/dL — ABNORMAL HIGH (ref 0.44–1.00)
GFR, Estimated: 34 mL/min — ABNORMAL LOW (ref >60)
Glucose, Bld: 87 mg/dL (ref 70–99)
Phosphorus: 3.6 mg/dL (ref 2.5–4.6)
Potassium: 3.8 mmol/L (ref 3.5–5.1)
Sodium: 142 mmol/L (ref 135–145)

## 2021-05-13 LAB — POCT HEMOGLOBIN-HEMACUE: Hemoglobin: 11.8 g/dL — ABNORMAL LOW (ref 12.0–15.0)

## 2021-05-13 MED ORDER — EPOETIN ALFA-EPBX 10000 UNIT/ML IJ SOLN
10000.0000 [IU] | Freq: Once | INTRAMUSCULAR | Status: AC
  Administered 2021-05-13: 14:00:00 10000 [IU] via SUBCUTANEOUS

## 2021-05-13 MED ORDER — EPOETIN ALFA-EPBX 10000 UNIT/ML IJ SOLN
INTRAMUSCULAR | Status: AC
  Filled 2021-05-13: qty 1

## 2021-05-14 LAB — PTH, INTACT AND CALCIUM
Calcium, Total (PTH): 9.7 mg/dL (ref 8.7–10.3)
PTH: 28 pg/mL (ref 15–65)

## 2021-05-28 DIAGNOSIS — M47816 Spondylosis without myelopathy or radiculopathy, lumbar region: Secondary | ICD-10-CM | POA: Diagnosis not present

## 2021-06-02 DIAGNOSIS — M1712 Unilateral primary osteoarthritis, left knee: Secondary | ICD-10-CM | POA: Diagnosis not present

## 2021-06-08 DIAGNOSIS — E538 Deficiency of other specified B group vitamins: Secondary | ICD-10-CM | POA: Diagnosis not present

## 2021-06-09 DIAGNOSIS — M1712 Unilateral primary osteoarthritis, left knee: Secondary | ICD-10-CM | POA: Diagnosis not present

## 2021-06-10 ENCOUNTER — Encounter (HOSPITAL_COMMUNITY)
Admission: RE | Admit: 2021-06-10 | Discharge: 2021-06-10 | Disposition: A | Discharge status: Home / Self Care | Payer: Medicare HMO | Source: Ambulatory Visit | Attending: Internal Medicine | Admitting: Internal Medicine

## 2021-06-10 ENCOUNTER — Encounter (HOSPITAL_COMMUNITY): Payer: Self-pay

## 2021-06-10 DIAGNOSIS — N185 Chronic kidney disease, stage 5: Secondary | ICD-10-CM | POA: Insufficient documentation

## 2021-06-10 DIAGNOSIS — D631 Anemia in chronic kidney disease: Secondary | ICD-10-CM | POA: Diagnosis not present

## 2021-06-10 LAB — IRON AND TIBC
Iron: 68 ug/dL (ref 28–170)
Saturation Ratios: 29 % (ref 10.4–31.8)
TIBC: 234 ug/dL — ABNORMAL LOW (ref 250–450)
UIBC: 166 ug/dL

## 2021-06-10 LAB — FERRITIN: Ferritin: 358 ng/mL — ABNORMAL HIGH (ref 11–307)

## 2021-06-10 LAB — POCT HEMOGLOBIN-HEMACUE: Hemoglobin: 11.2 g/dL — ABNORMAL LOW (ref 12.0–15.0)

## 2021-06-10 MED ORDER — EPOETIN ALFA-EPBX 10000 UNIT/ML IJ SOLN
INTRAMUSCULAR | Status: AC
  Filled 2021-06-10: qty 1

## 2021-06-10 MED ORDER — EPOETIN ALFA-EPBX 10000 UNIT/ML IJ SOLN
10000.0000 [IU] | Freq: Once | INTRAMUSCULAR | Status: AC
  Administered 2021-06-10: 10000 [IU] via SUBCUTANEOUS

## 2021-06-22 DIAGNOSIS — M79671 Pain in right foot: Secondary | ICD-10-CM | POA: Diagnosis not present

## 2021-06-22 DIAGNOSIS — M84374A Stress fracture, right foot, initial encounter for fracture: Secondary | ICD-10-CM | POA: Diagnosis not present

## 2021-07-02 DIAGNOSIS — M47816 Spondylosis without myelopathy or radiculopathy, lumbar region: Secondary | ICD-10-CM | POA: Diagnosis not present

## 2021-07-08 ENCOUNTER — Encounter (HOSPITAL_COMMUNITY)
Admission: RE | Admit: 2021-07-08 | Discharge: 2021-07-08 | Disposition: A | Discharge status: Home / Self Care | Payer: Medicare HMO | Source: Ambulatory Visit | Attending: Internal Medicine | Admitting: Internal Medicine

## 2021-07-08 ENCOUNTER — Encounter (HOSPITAL_COMMUNITY): Payer: Self-pay

## 2021-07-08 DIAGNOSIS — D631 Anemia in chronic kidney disease: Secondary | ICD-10-CM | POA: Insufficient documentation

## 2021-07-08 DIAGNOSIS — N185 Chronic kidney disease, stage 5: Secondary | ICD-10-CM | POA: Diagnosis not present

## 2021-07-08 LAB — RENAL FUNCTION PANEL
Albumin: 3.8 g/dL (ref 3.5–5.0)
Anion gap: 7 (ref 5–15)
BUN: 30 mg/dL — ABNORMAL HIGH (ref 8–23)
CO2: 22 mmol/L (ref 22–32)
Calcium: 9.1 mg/dL (ref 8.9–10.3)
Chloride: 105 mmol/L (ref 98–111)
Creatinine, Ser: 1.57 mg/dL — ABNORMAL HIGH (ref 0.44–1.00)
GFR, Estimated: 34 mL/min — ABNORMAL LOW (ref >60)
Glucose, Bld: 173 mg/dL — ABNORMAL HIGH (ref 70–99)
Phosphorus: 3.6 mg/dL (ref 2.5–4.6)
Potassium: 4.3 mmol/L (ref 3.5–5.1)
Sodium: 134 mmol/L — ABNORMAL LOW (ref 135–145)

## 2021-07-08 LAB — IRON AND TIBC
Iron: 67 ug/dL (ref 28–170)
Saturation Ratios: 26 % (ref 10.4–31.8)
TIBC: 261 ug/dL (ref 250–450)
UIBC: 194 ug/dL

## 2021-07-08 LAB — POCT HEMOGLOBIN-HEMACUE: Hemoglobin: 12.3 g/dL (ref 12.0–15.0)

## 2021-07-08 LAB — FERRITIN: Ferritin: 380 ng/mL — ABNORMAL HIGH (ref 11–307)

## 2021-07-08 MED ORDER — EPOETIN ALFA-EPBX 10000 UNIT/ML IJ SOLN: 10000.0000 [IU] | Freq: Once | INTRAMUSCULAR | Status: DC

## 2021-07-09 DIAGNOSIS — E538 Deficiency of other specified B group vitamins: Secondary | ICD-10-CM | POA: Diagnosis not present

## 2021-07-09 DIAGNOSIS — D631 Anemia in chronic kidney disease: Secondary | ICD-10-CM | POA: Diagnosis not present

## 2021-07-09 DIAGNOSIS — E876 Hypokalemia: Secondary | ICD-10-CM | POA: Diagnosis not present

## 2021-07-09 DIAGNOSIS — E1122 Type 2 diabetes mellitus with diabetic chronic kidney disease: Secondary | ICD-10-CM | POA: Diagnosis not present

## 2021-07-09 DIAGNOSIS — I129 Hypertensive chronic kidney disease with stage 1 through stage 4 chronic kidney disease, or unspecified chronic kidney disease: Secondary | ICD-10-CM | POA: Diagnosis not present

## 2021-07-09 DIAGNOSIS — M810 Age-related osteoporosis without current pathological fracture: Secondary | ICD-10-CM | POA: Diagnosis not present

## 2021-07-09 DIAGNOSIS — N119 Chronic tubulo-interstitial nephritis, unspecified: Secondary | ICD-10-CM | POA: Diagnosis not present

## 2021-07-09 DIAGNOSIS — N184 Chronic kidney disease, stage 4 (severe): Secondary | ICD-10-CM | POA: Diagnosis not present

## 2021-07-10 LAB — PTH, INTACT AND CALCIUM
Calcium, Total (PTH): 9.8 mg/dL (ref 8.7–10.3)
PTH: 39 pg/mL (ref 15–65)

## 2021-07-24 DIAGNOSIS — M722 Plantar fascial fibromatosis: Secondary | ICD-10-CM | POA: Diagnosis not present

## 2021-07-24 DIAGNOSIS — M84374A Stress fracture, right foot, initial encounter for fracture: Secondary | ICD-10-CM | POA: Diagnosis not present

## 2021-07-24 DIAGNOSIS — M79671 Pain in right foot: Secondary | ICD-10-CM | POA: Diagnosis not present

## 2021-07-24 DIAGNOSIS — M810 Age-related osteoporosis without current pathological fracture: Secondary | ICD-10-CM | POA: Diagnosis not present

## 2021-08-05 ENCOUNTER — Encounter (HOSPITAL_COMMUNITY): Payer: Medicare HMO

## 2021-08-07 DIAGNOSIS — M47896 Other spondylosis, lumbar region: Secondary | ICD-10-CM | POA: Diagnosis not present

## 2021-08-07 DIAGNOSIS — M47816 Spondylosis without myelopathy or radiculopathy, lumbar region: Secondary | ICD-10-CM | POA: Diagnosis not present

## 2021-08-10 DIAGNOSIS — E538 Deficiency of other specified B group vitamins: Secondary | ICD-10-CM | POA: Diagnosis not present

## 2021-08-17 DIAGNOSIS — I129 Hypertensive chronic kidney disease with stage 1 through stage 4 chronic kidney disease, or unspecified chronic kidney disease: Secondary | ICD-10-CM | POA: Diagnosis not present

## 2021-08-17 DIAGNOSIS — E7800 Pure hypercholesterolemia, unspecified: Secondary | ICD-10-CM | POA: Diagnosis not present

## 2021-08-17 DIAGNOSIS — R6889 Other general symptoms and signs: Secondary | ICD-10-CM | POA: Diagnosis not present

## 2021-08-17 DIAGNOSIS — R0609 Other forms of dyspnea: Secondary | ICD-10-CM | POA: Diagnosis not present

## 2021-08-17 DIAGNOSIS — I350 Nonrheumatic aortic (valve) stenosis: Secondary | ICD-10-CM | POA: Diagnosis not present

## 2021-08-17 DIAGNOSIS — E1121 Type 2 diabetes mellitus with diabetic nephropathy: Secondary | ICD-10-CM | POA: Diagnosis not present

## 2021-08-17 DIAGNOSIS — D631 Anemia in chronic kidney disease: Secondary | ICD-10-CM | POA: Diagnosis not present

## 2021-08-17 DIAGNOSIS — N184 Chronic kidney disease, stage 4 (severe): Secondary | ICD-10-CM | POA: Diagnosis not present

## 2021-08-20 ENCOUNTER — Ambulatory Visit
Admission: RE | Admit: 2021-08-20 | Discharge: 2021-08-20 | Disposition: A | Discharge status: Home / Self Care | Payer: Medicare HMO | Source: Ambulatory Visit | Attending: Geriatric Medicine | Admitting: Geriatric Medicine

## 2021-08-20 ENCOUNTER — Other Ambulatory Visit: Payer: Self-pay | Admitting: Geriatric Medicine

## 2021-08-20 DIAGNOSIS — R059 Cough, unspecified: Secondary | ICD-10-CM | POA: Diagnosis not present

## 2021-08-20 DIAGNOSIS — R0602 Shortness of breath: Secondary | ICD-10-CM | POA: Diagnosis not present

## 2021-08-20 DIAGNOSIS — R0609 Other forms of dyspnea: Secondary | ICD-10-CM

## 2021-08-31 DIAGNOSIS — D631 Anemia in chronic kidney disease: Secondary | ICD-10-CM | POA: Diagnosis not present

## 2021-08-31 DIAGNOSIS — I129 Hypertensive chronic kidney disease with stage 1 through stage 4 chronic kidney disease, or unspecified chronic kidney disease: Secondary | ICD-10-CM | POA: Diagnosis not present

## 2021-08-31 DIAGNOSIS — I700 Atherosclerosis of aorta: Secondary | ICD-10-CM | POA: Diagnosis not present

## 2021-08-31 DIAGNOSIS — E1121 Type 2 diabetes mellitus with diabetic nephropathy: Secondary | ICD-10-CM | POA: Diagnosis not present

## 2021-08-31 DIAGNOSIS — I350 Nonrheumatic aortic (valve) stenosis: Secondary | ICD-10-CM | POA: Diagnosis not present

## 2021-08-31 DIAGNOSIS — E7800 Pure hypercholesterolemia, unspecified: Secondary | ICD-10-CM | POA: Diagnosis not present

## 2021-08-31 DIAGNOSIS — N184 Chronic kidney disease, stage 4 (severe): Secondary | ICD-10-CM | POA: Diagnosis not present

## 2021-09-10 DIAGNOSIS — E538 Deficiency of other specified B group vitamins: Secondary | ICD-10-CM | POA: Diagnosis not present

## 2021-09-16 DIAGNOSIS — I350 Nonrheumatic aortic (valve) stenosis: Secondary | ICD-10-CM | POA: Diagnosis not present

## 2021-10-12 DIAGNOSIS — E538 Deficiency of other specified B group vitamins: Secondary | ICD-10-CM | POA: Diagnosis not present

## 2021-11-05 ENCOUNTER — Ambulatory Visit (HOSPITAL_COMMUNITY): Payer: Medicare HMO | Admitting: Physical Therapy

## 2021-11-06 DIAGNOSIS — D631 Anemia in chronic kidney disease: Secondary | ICD-10-CM | POA: Diagnosis not present

## 2021-11-06 DIAGNOSIS — E876 Hypokalemia: Secondary | ICD-10-CM | POA: Diagnosis not present

## 2021-11-06 DIAGNOSIS — N189 Chronic kidney disease, unspecified: Secondary | ICD-10-CM | POA: Diagnosis not present

## 2021-11-06 DIAGNOSIS — M810 Age-related osteoporosis without current pathological fracture: Secondary | ICD-10-CM | POA: Diagnosis not present

## 2021-11-06 DIAGNOSIS — I129 Hypertensive chronic kidney disease with stage 1 through stage 4 chronic kidney disease, or unspecified chronic kidney disease: Secondary | ICD-10-CM | POA: Diagnosis not present

## 2021-11-06 DIAGNOSIS — N184 Chronic kidney disease, stage 4 (severe): Secondary | ICD-10-CM | POA: Diagnosis not present

## 2021-11-06 DIAGNOSIS — N119 Chronic tubulo-interstitial nephritis, unspecified: Secondary | ICD-10-CM | POA: Diagnosis not present

## 2021-11-06 DIAGNOSIS — E1122 Type 2 diabetes mellitus with diabetic chronic kidney disease: Secondary | ICD-10-CM | POA: Diagnosis not present

## 2021-11-12 DIAGNOSIS — E538 Deficiency of other specified B group vitamins: Secondary | ICD-10-CM | POA: Diagnosis not present

## 2021-11-30 ENCOUNTER — Ambulatory Visit (HOSPITAL_COMMUNITY): Payer: Medicare HMO | Attending: Orthopedic Surgery | Admitting: Physical Therapy

## 2021-11-30 ENCOUNTER — Encounter (HOSPITAL_COMMUNITY): Payer: Self-pay | Admitting: Physical Therapy

## 2021-11-30 DIAGNOSIS — R29898 Other symptoms and signs involving the musculoskeletal system: Secondary | ICD-10-CM | POA: Diagnosis not present

## 2021-11-30 DIAGNOSIS — M25561 Pain in right knee: Secondary | ICD-10-CM | POA: Insufficient documentation

## 2021-11-30 DIAGNOSIS — R2689 Other abnormalities of gait and mobility: Secondary | ICD-10-CM

## 2021-11-30 DIAGNOSIS — M6281 Muscle weakness (generalized): Secondary | ICD-10-CM | POA: Diagnosis not present

## 2021-11-30 NOTE — Therapy (Signed)
OUTPATIENT PHYSICAL THERAPY LOWER EXTREMITY EVALUATION   Patient Name: Beth Malone MRN: [id number] DOB:July 10, 1945, 76 y.o., female Today's Date: 11/30/2021   PT End of Session - 11/30/21 0819     Visit Number 1    Number of Visits 1    Date for PT Re-Evaluation 11/30/21    Authorization Type (patient states it worker's comp-didnot provide any informaton claim# or contact person.) Limit 8 per day 00938 and 18299 and 37169. PrimaryJessica Priest    PT Start Time [id number]    PT Stop Time [phone]    PT Time Calculation (min) 19 min    Activity Tolerance Patient tolerated treatment well    Behavior During Therapy WFL for tasks assessed/performed             Past Medical History:  Diagnosis Date   Anemia    Anxiety    Arthritis    Chronic kidney disease    sees [facility]   Diabetes mellitus    type II   Dysphagia    last year or so    Femur fracture (HCC)    GERD (gastroesophageal reflux disease)    Heart murmur    History of blood transfusion    tumor on tube and    History of kidney stones    "stones in kidney"   Hypertension    Past Surgical History:  Procedure Laterality Date   ABDOMINAL HYSTERECTOMY     complete   AV FISTULA PLACEMENT Left 10/03/2019   Procedure: LEFT ARM ARTERIOVENOUS (AV) FISTULA CREATION;  Surgeon: [name], MD;  Location: [facility];  Service: Vascular;  Laterality: Left;   BALLOON DILATION N/A 03/22/2016   Procedure: BALLOON DILATION;  Surgeon: [name], MD;  [facility];  Service: Endoscopy;  Laterality: N/A;   BALLOON DILATION Bilateral 03/03/2021   Procedure: BALLOON DILATION;  Surgeon: Ronnette Juniper, MD;  [facility];  Service: Gastroenterology;  Laterality: Bilateral;   BIOPSY  03/03/2021   Procedure: BIOPSY;  Surgeon: Ronnette Juniper, MD;  [facility];  Service: Gastroenterology;;   BOTOX INJECTION Bilateral 03/03/2021   Procedure: BOTOX INJECTION;  Surgeon: Ronnette Juniper, MD;  [facility];  Service: Gastroenterology;  Laterality: Bilateral;   colonscopy  2014   ESOPHAGEAL MANOMETRY N/A 04/05/2016   Procedure: ESOPHAGEAL MANOMETRY (EM);  Surgeon: [name], MD;  [facility];  Service: Endoscopy;  Laterality: N/A;   ESOPHAGOGASTRODUODENOSCOPY (EGD) WITH PROPOFOL N/A 03/22/2016   Procedure: ESOPHAGOGASTRODUODENOSCOPY (EGD) WITH PROPOFOL;  Surgeon: [name], MD;  [facility];  Service: Endoscopy;  Laterality: N/A;   ESOPHAGOGASTRODUODENOSCOPY (EGD) WITH PROPOFOL N/A 06/01/2016   Procedure: ESOPHAGOGASTRODUODENOSCOPY (EGD) WITH PROPOFOL;  Surgeon: [name], MD;  [facility];  Service: Endoscopy;  Laterality: N/A;   ESOPHAGOGASTRODUODENOSCOPY (EGD) WITH PROPOFOL Bilateral 03/03/2021   Procedure: ESOPHAGOGASTRODUODENOSCOPY (EGD) WITH PROPOFOL.;  Surgeon: Ronnette Juniper, MD;  [facility];  Service: Gastroenterology;  Laterality: Bilateral;   FEMUR SURGERY Right 2011   rod placed   FOREIGN BODY REMOVAL  03/03/2021   Procedure: FOREIGN BODY REMOVAL;  Surgeon: Ronnette Juniper, MD;  [facility];  Service: Gastroenterology;;   LIGATION OF ARTERIOVENOUS  FISTULA Left 10/10/2019   Procedure: LIGATION OF LEFT ARM FISTULA;  Surgeon: [name], MD;  Location: [facility];  Service: Vascular;  Laterality: Left;   SCLEROTHERAPY  03/03/2021   Procedure: Clide Deutscher;  Surgeon: Ronnette Juniper, MD;  [facility];  Service: Gastroenterology;;  TUBAL LIGATION     There are no problems to display for this patient.   PCP: Lajean Manes MD  REFERRING PROVIDER: Rod Can, MD   REFERRING DIAG: hamstring tendonitis/R knee OA   THERAPY DIAG:  Right knee pain, unspecified chronicity  Other abnormalities of gait and mobility  Other symptoms and signs involving the musculoskeletal system  Muscle weakness (generalized)  Rationale for Evaluation and Treatment Rehabilitation  ONSET DATE: may 2023  SUBJECTIVE:    SUBJECTIVE STATEMENT: Patient states her knee has been feeling better since getting a shot in her knee. Its getting better. Was mostly having pain with walking and stairs.   PERTINENT HISTORY: DM, CKD, HTN  PAIN:  Are you having pain? No  PRECAUTIONS: None  WEIGHT BEARING RESTRICTIONS No  FALLS:  Has patient fallen in last 6 months? No  LIVING ENVIRONMENT: Lives with: lives with their family Lives in: House/apartment Stairs:  ramp Has following equipment at home: Single point cane and Environmental consultant - 2 wheeled  OCCUPATION: Retired  PLOF: Independent  PATIENT GOALS get checked over - came because she was told to   OBJECTIVE:   PATIENT SURVEYS:    Not completed due to possible one time visit  COGNITION:  Overall cognitive status: Within functional limits for tasks assessed     SENSATION: WFL   POSTURE: No Significant postural limitations  PALPATION: No tenderness throughout R knee  LOWER EXTREMITY ROM: WFL for tasks assessed  Active ROM Right eval Left eval  Hip flexion    Hip extension    Hip abduction    Hip adduction    Hip internal rotation    Hip external rotation    Knee flexion    Knee extension    Ankle dorsiflexion    Ankle plantarflexion    Ankle inversion    Ankle eversion     (Blank rows = not tested)  LOWER EXTREMITY MMT:  MMT Right eval Left eval  Hip flexion 4/5 4/5  Hip extension    Hip abduction    Hip adduction    Hip internal rotation    Hip external rotation    Knee flexion 4+/5 4/5  Knee extension 4+/5 4+/5  Ankle dorsiflexion 5/5 5/5  Ankle plantarflexion    Ankle inversion    Ankle eversion     (Blank rows = not tested)   FUNCTIONAL TESTS:  Stairs: wants to perform with step too pattern using RLE, increased difficulty and LLE weakness with trying to use L and perform alternating pattern  GAIT: Distance walked: 50 feet Assistive device utilized: None Level of assistance: Complete Independence Comments:  antalgic on LLE    TODAY'S TREATMENT: 11/30/21 Education   PATIENT EDUCATION:  Education details: Patient educated on exam findings, POC, scope of PT, HEP, and getting referral/returning to PT for L knee if she wants. Person educated: Patient Education method: Explanation, Demonstration, and Handouts Education comprehension: verbalized understanding, returned demonstration, verbal cues required, and tactile cues required   HOME EXERCISE PROGRAM: 11/30/21 n/a  ASSESSMENT:  CLINICAL IMPRESSION: Patient a 76 y.o. y.o. female who was seen today for physical therapy evaluation and treatment for hamstring tendonitis and R knee OA. Patient without symptoms in R knee at this time but wishes to be examined to assess for deficits. Patient with greater deficit with L knee mobility/strength and educated her on obtaining referral/returning to PT if she wishes to pursue further intervention with LLE. Patient does not require additional PT services at this time.  OBJECTIVE IMPAIRMENTS Abnormal gait, decreased activity tolerance, decreased mobility, difficulty walking, and decreased strength.   ACTIVITY LIMITATIONS squatting, stairs, transfers, and locomotion level  PARTICIPATION LIMITATIONS: cleaning, shopping, community activity, and yard work  PERSONAL FACTORS Age, Fitness, and Time since onset of injury/illness/exacerbation are also affecting patient's functional outcome.   REHAB POTENTIAL: Good  CLINICAL DECISION MAKING: Stable/uncomplicated  EVALUATION COMPLEXITY: Low   GOALS: Goals reviewed with patient? Yes  SHORT TERM GOALS: Target date:  11/30/21  Patient will be educated on exam findings and returning to PT if needed. Baseline:  Goal status: MET   PLAN: PT FREQUENCY: one time visit  PT DURATION: other: one time visit  PLANNED INTERVENTIONS: Therapeutic exercises, Therapeutic activity, Neuromuscular re-education, Balance training, Gait training, Patient/Family education,  Joint manipulation, Joint mobilization, Stair training, Orthotic/Fit training, DME instructions, Aquatic Therapy, Dry Needling, Electrical stimulation, Spinal manipulation, Spinal mobilization, Cryotherapy, Moist heat, Compression bandaging, scar mobilization, Splintting, Taping, Traction, Ultrasound, Ionotophoresis 21m/ml Dexamethasone, and Manual therapy   PLAN FOR NEXT SESSION: n/a   AVianne BullsZaunegger, PT 11/30/2021, 8:44 AM

## 2021-12-10 DIAGNOSIS — M810 Age-related osteoporosis without current pathological fracture: Secondary | ICD-10-CM | POA: Diagnosis not present

## 2021-12-10 DIAGNOSIS — E7800 Pure hypercholesterolemia, unspecified: Secondary | ICD-10-CM | POA: Diagnosis not present

## 2021-12-10 DIAGNOSIS — N184 Chronic kidney disease, stage 4 (severe): Secondary | ICD-10-CM | POA: Diagnosis not present

## 2021-12-10 DIAGNOSIS — E1121 Type 2 diabetes mellitus with diabetic nephropathy: Secondary | ICD-10-CM | POA: Diagnosis not present

## 2021-12-10 DIAGNOSIS — I129 Hypertensive chronic kidney disease with stage 1 through stage 4 chronic kidney disease, or unspecified chronic kidney disease: Secondary | ICD-10-CM | POA: Diagnosis not present

## 2021-12-16 IMAGING — DX DG CHEST 1V PORT
1 series · 1 of 1 positions shown · non-contrast
Comparison: May 09, 2019

CLINICAL DATA: Chest pain.

EXAM:
PORTABLE CHEST 1 VIEW

[chest ap]
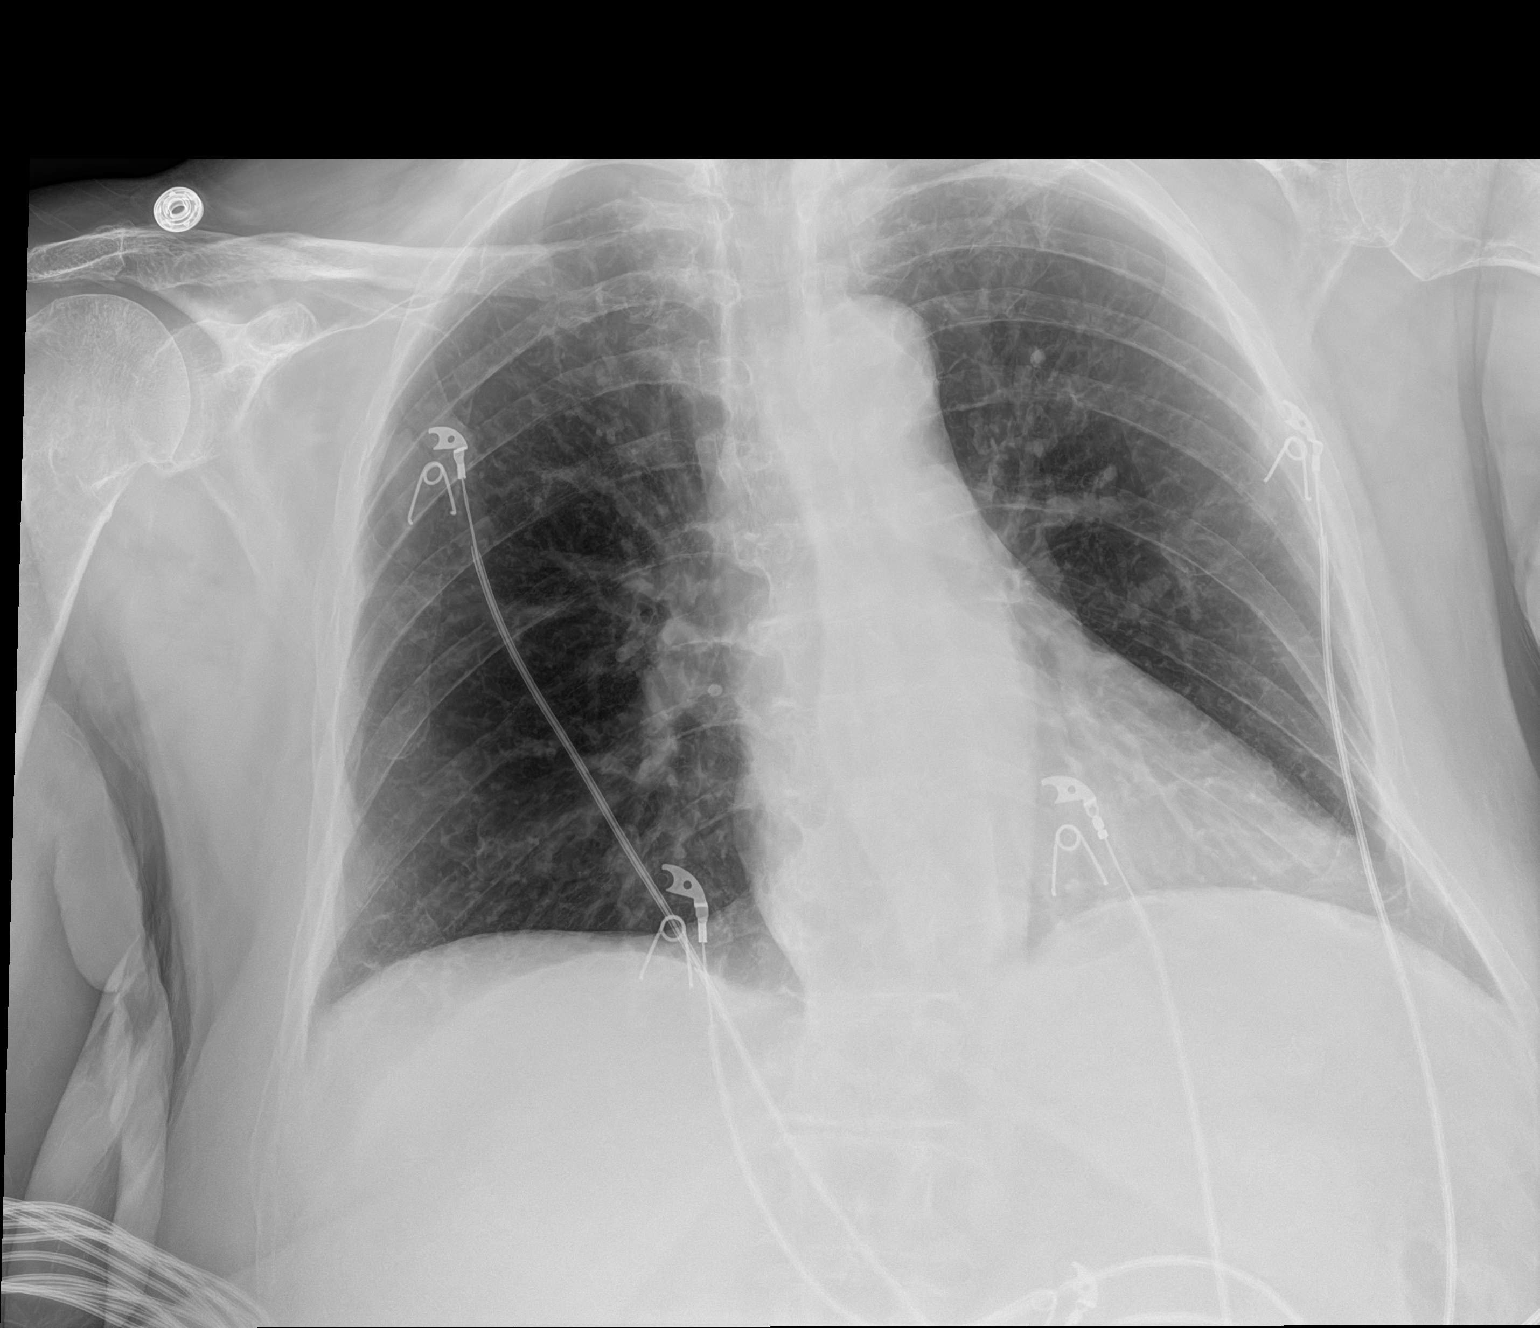

[1 of 1 positions shown; findings below may reference images not displayed]

FINDINGS: The heart size and mediastinal contours are within normal limits.
Both lungs are clear. The visualized skeletal structures are
unremarkable.
IMPRESSION: No active disease.

## 2021-12-18 DIAGNOSIS — L570 Actinic keratosis: Secondary | ICD-10-CM | POA: Diagnosis not present

## 2021-12-18 DIAGNOSIS — E538 Deficiency of other specified B group vitamins: Secondary | ICD-10-CM | POA: Diagnosis not present

## 2021-12-18 DIAGNOSIS — L814 Other melanin hyperpigmentation: Secondary | ICD-10-CM | POA: Diagnosis not present

## 2021-12-18 DIAGNOSIS — L821 Other seborrheic keratosis: Secondary | ICD-10-CM | POA: Diagnosis not present

## 2021-12-18 DIAGNOSIS — D3611 Benign neoplasm of peripheral nerves and autonomic nervous system of face, head, and neck: Secondary | ICD-10-CM | POA: Diagnosis not present

## 2021-12-18 DIAGNOSIS — Z85828 Personal history of other malignant neoplasm of skin: Secondary | ICD-10-CM | POA: Diagnosis not present

## 2022-01-26 ENCOUNTER — Other Ambulatory Visit: Payer: Self-pay | Admitting: Geriatric Medicine

## 2022-01-26 DIAGNOSIS — E538 Deficiency of other specified B group vitamins: Secondary | ICD-10-CM | POA: Diagnosis not present

## 2022-01-26 DIAGNOSIS — Z1231 Encounter for screening mammogram for malignant neoplasm of breast: Secondary | ICD-10-CM

## 2022-02-05 DIAGNOSIS — E1122 Type 2 diabetes mellitus with diabetic chronic kidney disease: Secondary | ICD-10-CM | POA: Diagnosis not present

## 2022-02-05 DIAGNOSIS — K219 Gastro-esophageal reflux disease without esophagitis: Secondary | ICD-10-CM | POA: Diagnosis not present

## 2022-02-05 DIAGNOSIS — I129 Hypertensive chronic kidney disease with stage 1 through stage 4 chronic kidney disease, or unspecified chronic kidney disease: Secondary | ICD-10-CM | POA: Diagnosis not present

## 2022-02-05 DIAGNOSIS — E785 Hyperlipidemia, unspecified: Secondary | ICD-10-CM | POA: Diagnosis not present

## 2022-02-05 DIAGNOSIS — K224 Dyskinesia of esophagus: Secondary | ICD-10-CM | POA: Diagnosis not present

## 2022-02-05 DIAGNOSIS — J309 Allergic rhinitis, unspecified: Secondary | ICD-10-CM | POA: Diagnosis not present

## 2022-02-05 DIAGNOSIS — E1151 Type 2 diabetes mellitus with diabetic peripheral angiopathy without gangrene: Secondary | ICD-10-CM | POA: Diagnosis not present

## 2022-02-05 DIAGNOSIS — R69 Illness, unspecified: Secondary | ICD-10-CM | POA: Diagnosis not present

## 2022-02-05 DIAGNOSIS — M810 Age-related osteoporosis without current pathological fracture: Secondary | ICD-10-CM | POA: Diagnosis not present

## 2022-02-05 DIAGNOSIS — M1990 Unspecified osteoarthritis, unspecified site: Secondary | ICD-10-CM | POA: Diagnosis not present

## 2022-02-05 DIAGNOSIS — E669 Obesity, unspecified: Secondary | ICD-10-CM | POA: Diagnosis not present

## 2022-02-05 DIAGNOSIS — E876 Hypokalemia: Secondary | ICD-10-CM | POA: Diagnosis not present

## 2022-02-09 ENCOUNTER — Telehealth: Payer: Self-pay

## 2022-02-09 NOTE — Patient Outreach (Signed)
  Care Coordination   02/09/2022 Name: Beth Malone MRN: [id number] DOB: 1945-12-01   Care Coordination Outreach Attempts:  An unsuccessful telephone outreach was attempted today to offer the patient information about available care coordination services as a benefit of their health plan.   Follow Up Plan:  Additional outreach attempts will be made to offer the patient care coordination information and services.   Encounter Outcome:  No Answer  Care Coordination Interventions Activated:  No   Care Coordination Interventions:  No, not indicated    [facility] [phone]

## 2022-02-17 DIAGNOSIS — H5203 Hypermetropia, bilateral: Secondary | ICD-10-CM | POA: Diagnosis not present

## 2022-03-01 ENCOUNTER — Ambulatory Visit
Admission: RE | Admit: 2022-03-01 | Discharge: 2022-03-01 | Disposition: A | Discharge status: Home / Self Care | Payer: Medicare HMO | Source: Ambulatory Visit | Attending: Geriatric Medicine | Admitting: Geriatric Medicine

## 2022-03-01 DIAGNOSIS — E538 Deficiency of other specified B group vitamins: Secondary | ICD-10-CM | POA: Diagnosis not present

## 2022-03-01 DIAGNOSIS — Z1231 Encounter for screening mammogram for malignant neoplasm of breast: Secondary | ICD-10-CM

## 2022-03-02 DIAGNOSIS — M810 Age-related osteoporosis without current pathological fracture: Secondary | ICD-10-CM | POA: Diagnosis not present

## 2022-03-02 DIAGNOSIS — E1121 Type 2 diabetes mellitus with diabetic nephropathy: Secondary | ICD-10-CM | POA: Diagnosis not present

## 2022-03-02 DIAGNOSIS — D631 Anemia in chronic kidney disease: Secondary | ICD-10-CM | POA: Diagnosis not present

## 2022-03-02 DIAGNOSIS — N184 Chronic kidney disease, stage 4 (severe): Secondary | ICD-10-CM | POA: Diagnosis not present

## 2022-03-02 DIAGNOSIS — I129 Hypertensive chronic kidney disease with stage 1 through stage 4 chronic kidney disease, or unspecified chronic kidney disease: Secondary | ICD-10-CM | POA: Diagnosis not present

## 2022-03-02 DIAGNOSIS — E7800 Pure hypercholesterolemia, unspecified: Secondary | ICD-10-CM | POA: Diagnosis not present

## 2022-03-12 DIAGNOSIS — I129 Hypertensive chronic kidney disease with stage 1 through stage 4 chronic kidney disease, or unspecified chronic kidney disease: Secondary | ICD-10-CM | POA: Diagnosis not present

## 2022-03-12 DIAGNOSIS — M810 Age-related osteoporosis without current pathological fracture: Secondary | ICD-10-CM | POA: Diagnosis not present

## 2022-03-12 DIAGNOSIS — N119 Chronic tubulo-interstitial nephritis, unspecified: Secondary | ICD-10-CM | POA: Diagnosis not present

## 2022-03-12 DIAGNOSIS — E1122 Type 2 diabetes mellitus with diabetic chronic kidney disease: Secondary | ICD-10-CM | POA: Diagnosis not present

## 2022-03-12 DIAGNOSIS — E876 Hypokalemia: Secondary | ICD-10-CM | POA: Diagnosis not present

## 2022-03-12 DIAGNOSIS — D631 Anemia in chronic kidney disease: Secondary | ICD-10-CM | POA: Diagnosis not present

## 2022-03-12 DIAGNOSIS — N184 Chronic kidney disease, stage 4 (severe): Secondary | ICD-10-CM | POA: Diagnosis not present

## 2022-03-15 DIAGNOSIS — M9905 Segmental and somatic dysfunction of pelvic region: Secondary | ICD-10-CM | POA: Diagnosis not present

## 2022-03-15 DIAGNOSIS — M9902 Segmental and somatic dysfunction of thoracic region: Secondary | ICD-10-CM | POA: Diagnosis not present

## 2022-03-15 DIAGNOSIS — M62830 Muscle spasm of back: Secondary | ICD-10-CM | POA: Diagnosis not present

## 2022-03-15 DIAGNOSIS — M9903 Segmental and somatic dysfunction of lumbar region: Secondary | ICD-10-CM | POA: Diagnosis not present

## 2022-03-16 DIAGNOSIS — E1121 Type 2 diabetes mellitus with diabetic nephropathy: Secondary | ICD-10-CM | POA: Diagnosis not present

## 2022-03-16 DIAGNOSIS — I129 Hypertensive chronic kidney disease with stage 1 through stage 4 chronic kidney disease, or unspecified chronic kidney disease: Secondary | ICD-10-CM | POA: Diagnosis not present

## 2022-03-16 DIAGNOSIS — E538 Deficiency of other specified B group vitamins: Secondary | ICD-10-CM | POA: Diagnosis not present

## 2022-03-16 DIAGNOSIS — I350 Nonrheumatic aortic (valve) stenosis: Secondary | ICD-10-CM | POA: Diagnosis not present

## 2022-03-16 DIAGNOSIS — Z23 Encounter for immunization: Secondary | ICD-10-CM | POA: Diagnosis not present

## 2022-03-16 DIAGNOSIS — I700 Atherosclerosis of aorta: Secondary | ICD-10-CM | POA: Diagnosis not present

## 2022-03-16 DIAGNOSIS — Z0000 Encounter for general adult medical examination without abnormal findings: Secondary | ICD-10-CM | POA: Diagnosis not present

## 2022-03-16 DIAGNOSIS — E7800 Pure hypercholesterolemia, unspecified: Secondary | ICD-10-CM | POA: Diagnosis not present

## 2022-03-16 DIAGNOSIS — Z1331 Encounter for screening for depression: Secondary | ICD-10-CM | POA: Diagnosis not present

## 2022-03-16 DIAGNOSIS — N184 Chronic kidney disease, stage 4 (severe): Secondary | ICD-10-CM | POA: Diagnosis not present

## 2022-03-16 DIAGNOSIS — M810 Age-related osteoporosis without current pathological fracture: Secondary | ICD-10-CM | POA: Diagnosis not present

## 2022-03-16 DIAGNOSIS — D631 Anemia in chronic kidney disease: Secondary | ICD-10-CM | POA: Diagnosis not present

## 2022-03-17 ENCOUNTER — Telehealth: Payer: Self-pay

## 2022-03-17 NOTE — Patient Outreach (Signed)
  Care Coordination   03/17/2022 Name: Beth Malone MRN: [id number] DOB: [birth year]   Care Coordination Outreach Attempts:  A second unsuccessful outreach was attempted today to offer the patient with information about available care coordination services as a benefit of their health plan.     Follow Up Plan:  Additional outreach attempts will be made to offer the patient care coordination information and services.   Encounter Outcome:  No Answer  Care Coordination Interventions Activated:  No   Care Coordination Interventions:  No, not indicated    [facility] [phone]

## 2022-03-18 DIAGNOSIS — M62830 Muscle spasm of back: Secondary | ICD-10-CM | POA: Diagnosis not present

## 2022-03-18 DIAGNOSIS — M9902 Segmental and somatic dysfunction of thoracic region: Secondary | ICD-10-CM | POA: Diagnosis not present

## 2022-03-18 DIAGNOSIS — M9905 Segmental and somatic dysfunction of pelvic region: Secondary | ICD-10-CM | POA: Diagnosis not present

## 2022-03-18 DIAGNOSIS — M9903 Segmental and somatic dysfunction of lumbar region: Secondary | ICD-10-CM | POA: Diagnosis not present

## 2022-03-22 DIAGNOSIS — M9902 Segmental and somatic dysfunction of thoracic region: Secondary | ICD-10-CM | POA: Diagnosis not present

## 2022-03-22 DIAGNOSIS — M9905 Segmental and somatic dysfunction of pelvic region: Secondary | ICD-10-CM | POA: Diagnosis not present

## 2022-03-22 DIAGNOSIS — M9903 Segmental and somatic dysfunction of lumbar region: Secondary | ICD-10-CM | POA: Diagnosis not present

## 2022-03-22 DIAGNOSIS — M62830 Muscle spasm of back: Secondary | ICD-10-CM | POA: Diagnosis not present

## 2022-03-26 DIAGNOSIS — M9902 Segmental and somatic dysfunction of thoracic region: Secondary | ICD-10-CM | POA: Diagnosis not present

## 2022-03-26 DIAGNOSIS — M62830 Muscle spasm of back: Secondary | ICD-10-CM | POA: Diagnosis not present

## 2022-03-26 DIAGNOSIS — M9903 Segmental and somatic dysfunction of lumbar region: Secondary | ICD-10-CM | POA: Diagnosis not present

## 2022-03-26 DIAGNOSIS — M9905 Segmental and somatic dysfunction of pelvic region: Secondary | ICD-10-CM | POA: Diagnosis not present

## 2022-03-28 ENCOUNTER — Emergency Department (HOSPITAL_COMMUNITY)
Admission: EM | Admit: 2022-03-28 | Discharge: 2022-03-28 | Disposition: A | Discharge status: Home / Self Care | Payer: Medicare HMO | Attending: Emergency Medicine | Admitting: Emergency Medicine

## 2022-03-28 ENCOUNTER — Encounter (HOSPITAL_COMMUNITY): Payer: Self-pay

## 2022-03-28 ENCOUNTER — Emergency Department (HOSPITAL_COMMUNITY): Payer: Medicare HMO

## 2022-03-28 ENCOUNTER — Other Ambulatory Visit: Payer: Self-pay

## 2022-03-28 DIAGNOSIS — W130XXA Fall from, out of or through balcony, initial encounter: Secondary | ICD-10-CM | POA: Insufficient documentation

## 2022-03-28 DIAGNOSIS — S52611A Displaced fracture of right ulna styloid process, initial encounter for closed fracture: Secondary | ICD-10-CM | POA: Diagnosis not present

## 2022-03-28 DIAGNOSIS — M7989 Other specified soft tissue disorders: Secondary | ICD-10-CM | POA: Diagnosis not present

## 2022-03-28 DIAGNOSIS — S52571A Other intraarticular fracture of lower end of right radius, initial encounter for closed fracture: Secondary | ICD-10-CM | POA: Diagnosis not present

## 2022-03-28 DIAGNOSIS — S6291XA Unspecified fracture of right wrist and hand, initial encounter for closed fracture: Secondary | ICD-10-CM | POA: Diagnosis not present

## 2022-03-28 DIAGNOSIS — S6991XA Unspecified injury of right wrist, hand and finger(s), initial encounter: Secondary | ICD-10-CM | POA: Diagnosis present

## 2022-03-28 DIAGNOSIS — S52511A Displaced fracture of right radial styloid process, initial encounter for closed fracture: Secondary | ICD-10-CM | POA: Diagnosis not present

## 2022-03-28 DIAGNOSIS — Z23 Encounter for immunization: Secondary | ICD-10-CM | POA: Insufficient documentation

## 2022-03-28 DIAGNOSIS — S52612A Displaced fracture of left ulna styloid process, initial encounter for closed fracture: Secondary | ICD-10-CM | POA: Diagnosis not present

## 2022-03-28 DIAGNOSIS — S62101A Fracture of unspecified carpal bone, right wrist, initial encounter for closed fracture: Secondary | ICD-10-CM

## 2022-03-28 MED ORDER — TETANUS-DIPHTH-ACELL PERTUSSIS 5-2.5-18.5 LF-MCG/0.5 IM SUSY
0.5000 mL | PREFILLED_SYRINGE | Freq: Once | INTRAMUSCULAR | Status: AC
  Administered 2022-03-28: 0.5 mL via INTRAMUSCULAR
  Filled 2022-03-28: qty 0.5

## 2022-03-28 MED ORDER — LIDOCAINE HCL (PF) 1 % IJ SOLN
30.0000 mL | Freq: Once | INTRAMUSCULAR | Status: AC
Start: 2022-03-28 — End: 2022-03-28
  Administered 2022-03-28: 30 mL via INTRADERMAL
  Filled 2022-03-28: qty 30

## 2022-03-28 MED ORDER — OXYCODONE HCL 5 MG PO TABS
10.0000 mg | ORAL_TABLET | Freq: Once | ORAL | Status: AC
Start: 2022-03-28 — End: 2022-03-28
  Administered 2022-03-28: 10 mg via ORAL
  Filled 2022-03-28: qty 2

## 2022-03-28 NOTE — ED Provider Notes (Signed)
[facility] EMERGENCY DEPARTMENT Provider Note   CSN: [id number] Arrival date & time: 03/28/22  1135     History Chief Complaint  Patient presents with   Fall    HPI Beth Malone is a 76 y.o. female presenting for fall.  Patient lost her balance on the porch and fell onto her right wrist.  She has an obvious deformity at the site.  No neurologic or vascular changes.  She denies fevers or chills, nausea vomiting, syncope shortness of breath which is otherwise ambulatory tolerating p.o. intake at this time.  She did not hit her head.  Patient otherwise in no acute distress at this time..   Patient's recorded medical, surgical, social, medication list and allergies were reviewed in the Snapshot window as part of the initial history.   Review of Systems   Review of Systems  Constitutional:  Negative for chills and fever.  HENT:  Negative for ear pain and sore throat.   Eyes:  Negative for pain and visual disturbance.  Respiratory:  Negative for cough and shortness of breath.   Cardiovascular:  Negative for chest pain and palpitations.  Gastrointestinal:  Negative for abdominal pain and vomiting.  Genitourinary:  Negative for dysuria and hematuria.  Musculoskeletal:  Negative for arthralgias and back pain.  Skin:  Negative for color change and rash.  Neurological:  Negative for seizures and syncope.  All other systems reviewed and are negative.   Physical Exam Updated Vital Signs BP 117/71   Pulse 80   Temp 97.6 F (36.4 C) (Oral) Comment: Simultaneous filing. User may not have seen previous data. Comment (Src): Simultaneous filing. User may not have seen previous data.  Resp 16   Ht '5\' 4"'$  (1.626 m)   Wt 81.6 kg   SpO2 96%   BMI 30.90 kg/m  Physical Exam Vitals and nursing note reviewed.  Constitutional:      General: She is not in acute distress.    Appearance: She is well-developed.  HENT:     Head: Normocephalic and atraumatic.  Eyes:      Conjunctiva/sclera: Conjunctivae normal.  Cardiovascular:     Rate and Rhythm: Normal rate and regular rhythm.     Heart sounds: No murmur heard. Pulmonary:     Effort: Pulmonary effort is normal. No respiratory distress.     Breath sounds: Normal breath sounds.  Abdominal:     General: There is no distension.     Palpations: Abdomen is soft.     Tenderness: There is no abdominal tenderness. There is no right CVA tenderness or left CVA tenderness.  Musculoskeletal:        General: Deformity present. No swelling or tenderness. Normal range of motion.     Cervical back: Neck supple.  Skin:    General: Skin is warm and dry.  Neurological:     General: No focal deficit present.     Mental Status: She is alert and oriented to person, place, and time. Mental status is at baseline.     Cranial Nerves: No cranial nerve deficit.      ED Course/ Medical Decision Making/ A&P    Procedures Reduction of fracture  Date/Time: 03/28/2022 2:25 PM  Performed by: Tretha Sciara, MD Authorized by: Tretha Sciara, MD  Consent: Verbal consent obtained. Risks and benefits: risks, benefits and alternatives were discussed Consent given by: patient Patient understanding: patient states understanding of the procedure being performed Patient identity confirmed: verbally with patient Time out: Immediately prior to procedure  a "time out" was called to verify the correct patient, procedure, equipment, support staff and site/side marked as required. Preparation: Patient was prepped and draped in the usual sterile fashion. Local anesthesia used: yes Anesthesia: hematoma block  Anesthesia: Local anesthesia used: yes Local Anesthetic: lidocaine 1% without epinephrine Anesthetic total: 20 mL Patient tolerance: patient tolerated the procedure well with no immediate complications      Medications Ordered in ED Medications  oxyCODONE (Oxy IR/ROXICODONE) immediate release tablet 10 mg (10 mg Oral  Given 03/28/22 1220)  lidocaine (PF) (XYLOCAINE) 1 % injection 30 mL (30 mLs Intradermal Given 03/28/22 1224)  Tdap (BOOSTRIX) injection 0.5 mL (0.5 mLs Intramuscular Given 03/28/22 1222)   Medical Decision Making:    Beth Malone is a 76 y.o. female who presented to the ED today with a moderate mechanisma trauma, detailed above.    Additional history discussed with patient's family/caregivers.  Patient placed on continuous vitals and telemetry monitoring while in ED which was reviewed periodically.   Given this mechanism of trauma, a full physical exam was performed. Notably, patient was with an obvious right wrist deformity.   Reviewed and confirmed nursing documentation for past medical history, family history, social history.    Initial Assessment/Plan:   This is a patient presenting with a moderate mechanism trauma.  As such, I have considered intracranial injuries including intracranial hemorrhage, intrathoracic injuries including blunt myocardial or blunt lung injury, blunt abdominal injuries including aortic dissection, bladder injury, spleen injury, liver injury and I have considered orthopedic injuries including extremity or spinal injury.  With the patient's presentation of moderate mechanism trauma but an otherwise reassuring exam, patient warrants targeted evaluation for potential traumatic injuries. Will proceed with targeted evaluation for potential injuries. Will proceed with right wrist x-ray. Objective evaluation resulted with radius fracture and ulnar styloid fracture with some displacement.  Fortunately no neurovascular compromise  Final Reassessment and Plan:   Fracture reduction performed as above with moderate alignment improvement from an exterior view.  Patient continues to have no neurologic symptoms and no vascular compromise on repeat exam. Discussed case with on-call with [facility] which is the patient's preferred orthopedic group.  They recommended that  the patient contact the clinic tomorrow to schedule an appointment for Tuesday morning. Patient ambulatory tolerating p.o. intake at this time phone number provided for outpatient care management and strict return precautions regarding development of neurovascular compromise reinforced and patient expressed understanding.  Patient discharged with no further acute events.   Clinical Impression:  1. Closed fracture of right wrist, initial encounter      Discharge   Final Clinical Impression(s) / ED Diagnoses Final diagnoses:  Closed fracture of right wrist, initial encounter    Rx / DC Orders ED Discharge Orders     None         Tretha Sciara, MD 03/28/22 1426

## 2022-03-28 NOTE — ED Triage Notes (Signed)
Pt presents to ED following fall off porch from a "couple of feet" Pt denies LOC or hitting head. Pt with right wrist pain and laceration to right forearm. Bleeding controlled at this time.

## 2022-03-28 NOTE — ED Notes (Signed)
Open area to right arm cleaned

## 2022-03-28 NOTE — ED Notes (Signed)
Edp applied splint and assessed circulation.

## 2022-03-29 DIAGNOSIS — G8918 Other acute postprocedural pain: Secondary | ICD-10-CM | POA: Diagnosis not present

## 2022-03-29 DIAGNOSIS — M25531 Pain in right wrist: Secondary | ICD-10-CM | POA: Diagnosis not present

## 2022-03-29 DIAGNOSIS — S52501A Unspecified fracture of the lower end of right radius, initial encounter for closed fracture: Secondary | ICD-10-CM | POA: Diagnosis not present

## 2022-04-05 DIAGNOSIS — S52501A Unspecified fracture of the lower end of right radius, initial encounter for closed fracture: Secondary | ICD-10-CM | POA: Diagnosis not present

## 2022-04-05 DIAGNOSIS — G8918 Other acute postprocedural pain: Secondary | ICD-10-CM | POA: Diagnosis not present

## 2022-04-05 DIAGNOSIS — M25531 Pain in right wrist: Secondary | ICD-10-CM | POA: Diagnosis not present

## 2022-04-07 DIAGNOSIS — M9905 Segmental and somatic dysfunction of pelvic region: Secondary | ICD-10-CM | POA: Diagnosis not present

## 2022-04-07 DIAGNOSIS — M62830 Muscle spasm of back: Secondary | ICD-10-CM | POA: Diagnosis not present

## 2022-04-07 DIAGNOSIS — M9903 Segmental and somatic dysfunction of lumbar region: Secondary | ICD-10-CM | POA: Diagnosis not present

## 2022-04-07 DIAGNOSIS — M9902 Segmental and somatic dysfunction of thoracic region: Secondary | ICD-10-CM | POA: Diagnosis not present

## 2022-05-03 DIAGNOSIS — M25531 Pain in right wrist: Secondary | ICD-10-CM | POA: Diagnosis not present

## 2022-05-03 DIAGNOSIS — S52501A Unspecified fracture of the lower end of right radius, initial encounter for closed fracture: Secondary | ICD-10-CM | POA: Diagnosis not present

## 2022-05-07 DIAGNOSIS — M9902 Segmental and somatic dysfunction of thoracic region: Secondary | ICD-10-CM | POA: Diagnosis not present

## 2022-05-07 DIAGNOSIS — M9903 Segmental and somatic dysfunction of lumbar region: Secondary | ICD-10-CM | POA: Diagnosis not present

## 2022-05-07 DIAGNOSIS — M9905 Segmental and somatic dysfunction of pelvic region: Secondary | ICD-10-CM | POA: Diagnosis not present

## 2022-05-07 DIAGNOSIS — M62830 Muscle spasm of back: Secondary | ICD-10-CM | POA: Diagnosis not present

## 2022-05-10 DIAGNOSIS — M25641 Stiffness of right hand, not elsewhere classified: Secondary | ICD-10-CM | POA: Diagnosis not present

## 2022-05-12 DIAGNOSIS — E538 Deficiency of other specified B group vitamins: Secondary | ICD-10-CM | POA: Diagnosis not present

## 2022-05-20 DIAGNOSIS — M25641 Stiffness of right hand, not elsewhere classified: Secondary | ICD-10-CM | POA: Diagnosis not present

## 2022-06-02 DIAGNOSIS — S52501A Unspecified fracture of the lower end of right radius, initial encounter for closed fracture: Secondary | ICD-10-CM | POA: Diagnosis not present

## 2022-06-02 DIAGNOSIS — M25531 Pain in right wrist: Secondary | ICD-10-CM | POA: Diagnosis not present

## 2022-06-09 DIAGNOSIS — M9905 Segmental and somatic dysfunction of pelvic region: Secondary | ICD-10-CM | POA: Diagnosis not present

## 2022-06-09 DIAGNOSIS — M9903 Segmental and somatic dysfunction of lumbar region: Secondary | ICD-10-CM | POA: Diagnosis not present

## 2022-06-09 DIAGNOSIS — M9902 Segmental and somatic dysfunction of thoracic region: Secondary | ICD-10-CM | POA: Diagnosis not present

## 2022-06-09 DIAGNOSIS — M62830 Muscle spasm of back: Secondary | ICD-10-CM | POA: Diagnosis not present

## 2022-06-10 DIAGNOSIS — M25641 Stiffness of right hand, not elsewhere classified: Secondary | ICD-10-CM | POA: Diagnosis not present

## 2022-06-15 DIAGNOSIS — E538 Deficiency of other specified B group vitamins: Secondary | ICD-10-CM | POA: Diagnosis not present

## 2022-07-01 DIAGNOSIS — N184 Chronic kidney disease, stage 4 (severe): Secondary | ICD-10-CM | POA: Diagnosis not present

## 2022-07-01 DIAGNOSIS — N390 Urinary tract infection, site not specified: Secondary | ICD-10-CM | POA: Diagnosis not present

## 2022-07-01 DIAGNOSIS — E876 Hypokalemia: Secondary | ICD-10-CM | POA: Diagnosis not present

## 2022-07-01 DIAGNOSIS — M810 Age-related osteoporosis without current pathological fracture: Secondary | ICD-10-CM | POA: Diagnosis not present

## 2022-07-01 DIAGNOSIS — D631 Anemia in chronic kidney disease: Secondary | ICD-10-CM | POA: Diagnosis not present

## 2022-07-01 DIAGNOSIS — E1129 Type 2 diabetes mellitus with other diabetic kidney complication: Secondary | ICD-10-CM | POA: Diagnosis not present

## 2022-07-01 DIAGNOSIS — E1122 Type 2 diabetes mellitus with diabetic chronic kidney disease: Secondary | ICD-10-CM | POA: Diagnosis not present

## 2022-07-01 DIAGNOSIS — N119 Chronic tubulo-interstitial nephritis, unspecified: Secondary | ICD-10-CM | POA: Diagnosis not present

## 2022-07-01 DIAGNOSIS — N189 Chronic kidney disease, unspecified: Secondary | ICD-10-CM | POA: Diagnosis not present

## 2022-07-01 DIAGNOSIS — I129 Hypertensive chronic kidney disease with stage 1 through stage 4 chronic kidney disease, or unspecified chronic kidney disease: Secondary | ICD-10-CM | POA: Diagnosis not present

## 2022-07-07 DIAGNOSIS — M62830 Muscle spasm of back: Secondary | ICD-10-CM | POA: Diagnosis not present

## 2022-07-07 DIAGNOSIS — M9902 Segmental and somatic dysfunction of thoracic region: Secondary | ICD-10-CM | POA: Diagnosis not present

## 2022-07-07 DIAGNOSIS — M9903 Segmental and somatic dysfunction of lumbar region: Secondary | ICD-10-CM | POA: Diagnosis not present

## 2022-07-07 DIAGNOSIS — M9905 Segmental and somatic dysfunction of pelvic region: Secondary | ICD-10-CM | POA: Diagnosis not present

## 2022-07-13 DIAGNOSIS — M9902 Segmental and somatic dysfunction of thoracic region: Secondary | ICD-10-CM | POA: Diagnosis not present

## 2022-07-13 DIAGNOSIS — M9905 Segmental and somatic dysfunction of pelvic region: Secondary | ICD-10-CM | POA: Diagnosis not present

## 2022-07-13 DIAGNOSIS — M9903 Segmental and somatic dysfunction of lumbar region: Secondary | ICD-10-CM | POA: Diagnosis not present

## 2022-07-13 DIAGNOSIS — M62830 Muscle spasm of back: Secondary | ICD-10-CM | POA: Diagnosis not present

## 2022-07-14 DIAGNOSIS — M25562 Pain in left knee: Secondary | ICD-10-CM | POA: Diagnosis not present

## 2022-07-14 DIAGNOSIS — M25662 Stiffness of left knee, not elsewhere classified: Secondary | ICD-10-CM | POA: Diagnosis not present

## 2022-07-14 DIAGNOSIS — M1712 Unilateral primary osteoarthritis, left knee: Secondary | ICD-10-CM | POA: Diagnosis not present

## 2022-07-14 DIAGNOSIS — R262 Difficulty in walking, not elsewhere classified: Secondary | ICD-10-CM | POA: Diagnosis not present

## 2022-07-16 DIAGNOSIS — E538 Deficiency of other specified B group vitamins: Secondary | ICD-10-CM | POA: Diagnosis not present

## 2022-07-20 DIAGNOSIS — Z1211 Encounter for screening for malignant neoplasm of colon: Secondary | ICD-10-CM | POA: Diagnosis not present

## 2022-07-20 DIAGNOSIS — K648 Other hemorrhoids: Secondary | ICD-10-CM | POA: Diagnosis not present

## 2022-07-20 DIAGNOSIS — D122 Benign neoplasm of ascending colon: Secondary | ICD-10-CM | POA: Diagnosis not present

## 2022-07-20 DIAGNOSIS — D123 Benign neoplasm of transverse colon: Secondary | ICD-10-CM | POA: Diagnosis not present

## 2022-07-22 DIAGNOSIS — D123 Benign neoplasm of transverse colon: Secondary | ICD-10-CM | POA: Diagnosis not present

## 2022-07-22 DIAGNOSIS — D122 Benign neoplasm of ascending colon: Secondary | ICD-10-CM | POA: Diagnosis not present

## 2022-07-23 DIAGNOSIS — M9903 Segmental and somatic dysfunction of lumbar region: Secondary | ICD-10-CM | POA: Diagnosis not present

## 2022-07-23 DIAGNOSIS — M9902 Segmental and somatic dysfunction of thoracic region: Secondary | ICD-10-CM | POA: Diagnosis not present

## 2022-07-23 DIAGNOSIS — M62830 Muscle spasm of back: Secondary | ICD-10-CM | POA: Diagnosis not present

## 2022-07-23 DIAGNOSIS — M9905 Segmental and somatic dysfunction of pelvic region: Secondary | ICD-10-CM | POA: Diagnosis not present

## 2022-07-26 DIAGNOSIS — M9905 Segmental and somatic dysfunction of pelvic region: Secondary | ICD-10-CM | POA: Diagnosis not present

## 2022-07-26 DIAGNOSIS — M62830 Muscle spasm of back: Secondary | ICD-10-CM | POA: Diagnosis not present

## 2022-07-26 DIAGNOSIS — M9903 Segmental and somatic dysfunction of lumbar region: Secondary | ICD-10-CM | POA: Diagnosis not present

## 2022-07-26 DIAGNOSIS — M9902 Segmental and somatic dysfunction of thoracic region: Secondary | ICD-10-CM | POA: Diagnosis not present

## 2022-07-28 DIAGNOSIS — M1712 Unilateral primary osteoarthritis, left knee: Secondary | ICD-10-CM | POA: Diagnosis not present

## 2022-07-28 DIAGNOSIS — M25662 Stiffness of left knee, not elsewhere classified: Secondary | ICD-10-CM | POA: Diagnosis not present

## 2022-07-28 DIAGNOSIS — R262 Difficulty in walking, not elsewhere classified: Secondary | ICD-10-CM | POA: Diagnosis not present

## 2022-07-28 DIAGNOSIS — M25562 Pain in left knee: Secondary | ICD-10-CM | POA: Diagnosis not present

## 2022-08-04 DIAGNOSIS — M9902 Segmental and somatic dysfunction of thoracic region: Secondary | ICD-10-CM | POA: Diagnosis not present

## 2022-08-04 DIAGNOSIS — M62830 Muscle spasm of back: Secondary | ICD-10-CM | POA: Diagnosis not present

## 2022-08-04 DIAGNOSIS — M9903 Segmental and somatic dysfunction of lumbar region: Secondary | ICD-10-CM | POA: Diagnosis not present

## 2022-08-04 DIAGNOSIS — M9905 Segmental and somatic dysfunction of pelvic region: Secondary | ICD-10-CM | POA: Diagnosis not present

## 2022-08-05 DIAGNOSIS — M25562 Pain in left knee: Secondary | ICD-10-CM | POA: Diagnosis not present

## 2022-08-05 DIAGNOSIS — R262 Difficulty in walking, not elsewhere classified: Secondary | ICD-10-CM | POA: Diagnosis not present

## 2022-08-05 DIAGNOSIS — M25662 Stiffness of left knee, not elsewhere classified: Secondary | ICD-10-CM | POA: Diagnosis not present

## 2022-08-05 DIAGNOSIS — M1712 Unilateral primary osteoarthritis, left knee: Secondary | ICD-10-CM | POA: Diagnosis not present

## 2022-08-11 DIAGNOSIS — M9902 Segmental and somatic dysfunction of thoracic region: Secondary | ICD-10-CM | POA: Diagnosis not present

## 2022-08-11 DIAGNOSIS — M62830 Muscle spasm of back: Secondary | ICD-10-CM | POA: Diagnosis not present

## 2022-08-11 DIAGNOSIS — M9903 Segmental and somatic dysfunction of lumbar region: Secondary | ICD-10-CM | POA: Diagnosis not present

## 2022-08-11 DIAGNOSIS — M9905 Segmental and somatic dysfunction of pelvic region: Secondary | ICD-10-CM | POA: Diagnosis not present

## 2022-08-12 DIAGNOSIS — M25562 Pain in left knee: Secondary | ICD-10-CM | POA: Diagnosis not present

## 2022-08-12 DIAGNOSIS — R262 Difficulty in walking, not elsewhere classified: Secondary | ICD-10-CM | POA: Diagnosis not present

## 2022-08-12 DIAGNOSIS — M25662 Stiffness of left knee, not elsewhere classified: Secondary | ICD-10-CM | POA: Diagnosis not present

## 2022-08-12 DIAGNOSIS — M1712 Unilateral primary osteoarthritis, left knee: Secondary | ICD-10-CM | POA: Diagnosis not present

## 2022-08-16 DIAGNOSIS — E538 Deficiency of other specified B group vitamins: Secondary | ICD-10-CM | POA: Diagnosis not present

## 2022-08-25 DIAGNOSIS — M9905 Segmental and somatic dysfunction of pelvic region: Secondary | ICD-10-CM | POA: Diagnosis not present

## 2022-08-25 DIAGNOSIS — M9902 Segmental and somatic dysfunction of thoracic region: Secondary | ICD-10-CM | POA: Diagnosis not present

## 2022-08-25 DIAGNOSIS — M62830 Muscle spasm of back: Secondary | ICD-10-CM | POA: Diagnosis not present

## 2022-08-25 DIAGNOSIS — M9903 Segmental and somatic dysfunction of lumbar region: Secondary | ICD-10-CM | POA: Diagnosis not present

## 2022-08-31 DIAGNOSIS — Z03818 Encounter for observation for suspected exposure to other biological agents ruled out: Secondary | ICD-10-CM | POA: Diagnosis not present

## 2022-08-31 DIAGNOSIS — J019 Acute sinusitis, unspecified: Secondary | ICD-10-CM | POA: Diagnosis not present

## 2022-08-31 DIAGNOSIS — R059 Cough, unspecified: Secondary | ICD-10-CM | POA: Diagnosis not present

## 2022-09-13 DIAGNOSIS — J0110 Acute frontal sinusitis, unspecified: Secondary | ICD-10-CM | POA: Diagnosis not present

## 2022-09-13 DIAGNOSIS — N184 Chronic kidney disease, stage 4 (severe): Secondary | ICD-10-CM | POA: Diagnosis not present

## 2022-09-13 DIAGNOSIS — I129 Hypertensive chronic kidney disease with stage 1 through stage 4 chronic kidney disease, or unspecified chronic kidney disease: Secondary | ICD-10-CM | POA: Diagnosis not present

## 2022-09-13 DIAGNOSIS — E1121 Type 2 diabetes mellitus with diabetic nephropathy: Secondary | ICD-10-CM | POA: Diagnosis not present

## 2022-09-13 DIAGNOSIS — E1122 Type 2 diabetes mellitus with diabetic chronic kidney disease: Secondary | ICD-10-CM | POA: Diagnosis not present

## 2022-09-13 DIAGNOSIS — I272 Pulmonary hypertension, unspecified: Secondary | ICD-10-CM | POA: Diagnosis not present

## 2022-09-13 DIAGNOSIS — M810 Age-related osteoporosis without current pathological fracture: Secondary | ICD-10-CM | POA: Diagnosis not present

## 2022-09-13 DIAGNOSIS — I700 Atherosclerosis of aorta: Secondary | ICD-10-CM | POA: Diagnosis not present

## 2022-09-13 DIAGNOSIS — I350 Nonrheumatic aortic (valve) stenosis: Secondary | ICD-10-CM | POA: Diagnosis not present

## 2022-09-22 DIAGNOSIS — E538 Deficiency of other specified B group vitamins: Secondary | ICD-10-CM | POA: Diagnosis not present

## 2022-10-22 DIAGNOSIS — E538 Deficiency of other specified B group vitamins: Secondary | ICD-10-CM | POA: Diagnosis not present

## 2022-11-12 ENCOUNTER — Ambulatory Visit
Admission: EM | Admit: 2022-11-12 | Discharge: 2022-11-12 | Disposition: A | Discharge status: Home / Self Care | Payer: Medicare HMO | Attending: Urgent Care | Admitting: Urgent Care

## 2022-11-12 DIAGNOSIS — M549 Dorsalgia, unspecified: Secondary | ICD-10-CM

## 2022-11-12 DIAGNOSIS — M62838 Other muscle spasm: Secondary | ICD-10-CM | POA: Diagnosis not present

## 2022-11-12 MED ORDER — DICLOFENAC SODIUM 1 % EX GEL: 2.0000 g | Freq: Three times a day (TID) | CUTANEOUS | 0 refills | Status: AC | PRN

## 2022-11-12 MED ORDER — CYCLOBENZAPRINE HCL 5 MG PO TABS: 5.0000 mg | ORAL_TABLET | Freq: Two times a day (BID) | ORAL | 0 refills | Status: DC | PRN

## 2022-11-12 NOTE — ED Provider Notes (Signed)
-[facility]  Note:  This document was prepared using Dragon voice recognition software and may include unintentional dictation errors.  MRN: [id number] DOB: 1946-05-07  Subjective:   Beth Malone is a 77 y.o. female presenting for 3 day history of acute onset right upper back pain. No fall, trauma, weakness, redness, warmth, wounds, swelling. Sleeps on a recliner due to low back pain. Has history of CKD, last creatinine level was 1.57 from 07/08/2021.   No current facility-administered medications for this encounter.  Current Outpatient Medications:    acetaminophen (TYLENOL) 650 MG CR tablet, Take 1,300 mg by mouth 2 (two) times daily., Disp: , Rfl:    ALPRAZolam (XANAX) 0.25 MG tablet, Take 0.25 mg by mouth daily as needed for anxiety. anxiety , Disp: , Rfl:    amLODipine (NORVASC) 10 MG tablet, Take 10 mg by mouth daily., Disp: , Rfl:    atorvastatin (LIPITOR) 20 MG tablet, Take 20 mg by mouth at bedtime. , Disp: , Rfl:    Biotin 40981 MCG TABS, Take 10,000 mcg by mouth daily., Disp: , Rfl:    cetirizine (ZYRTEC) 10 MG tablet, Take 10 mg by mouth daily., Disp: , Rfl:    famotidine (PEPCID) 20 MG tablet, Take 40 mg by mouth 2 (two) times daily. , Disp: , Rfl:    fluticasone (FLONASE) 50 MCG/ACT nasal spray, Place 2 sprays into both nostrils 2 (two) times daily., Disp: , Rfl:    furosemide (LASIX) 20 MG tablet, Take 20 mg by mouth 2 (two) times daily., Disp: , Rfl:    glipiZIDE (GLUCOTROL) 5 MG tablet, Take 5 mg by mouth 2 (two) times daily before a meal. , Disp: , Rfl:    methocarbamol (ROBAXIN) 500 MG tablet, Take 1 tablet (500 mg total) by mouth 4 (four) times daily. (Patient not taking: Reported on 02/26/2021), Disp: 20 tablet, Rfl: 0   Metoprolol Succinate 50 MG CS24, Take 50 mg by mouth daily. , Disp: , Rfl:    Potassium Chloride ER 20 MEQ TBCR, Take 20 mEq by mouth 2 (two) times daily., Disp: , Rfl:    Turmeric 500 MG TABS, Take 1,000 mg by mouth daily.,  Disp: , Rfl:    Allergies  Allergen Reactions   Augmentin [Amoxicillin-Pot Clavulanate] Nausea And Vomiting   Codeine Itching and Other (See Comments)    Don't sleep    Past Medical History:  Diagnosis Date   Anemia    Anxiety    Arthritis    Chronic kidney disease    sees [facility]   Diabetes mellitus    type II   Dysphagia    last year or so    Femur fracture (HCC)    GERD (gastroesophageal reflux disease)    Heart murmur    History of blood transfusion    tumor on tube and    History of kidney stones    "stones in kidney"   Hypertension      Past Surgical History:  Procedure Laterality Date   ABDOMINAL HYSTERECTOMY     complete   AV FISTULA PLACEMENT Left 10/03/2019   Procedure: LEFT ARM ARTERIOVENOUS (AV) FISTULA CREATION;  Surgeon: Maeola Harman, MD;  Location: [facility] OR;  Service: Vascular;  Laterality: Left;   BALLOON DILATION N/A 03/22/2016   Procedure: BALLOON DILATION;  Surgeon: Charolett Bumpers, MD;  Location: [facility];  Service: Endoscopy;  Laterality: N/A;   BALLOON DILATION Bilateral 03/03/2021   Procedure: BALLOON DILATION;  Surgeon: Kerin Salen, MD;  Location: [facility];  Service: Gastroenterology;  Laterality: Bilateral;   BIOPSY  03/03/2021   Procedure: BIOPSY;  Surgeon: Kerin Salen, MD;  [facility];  Service: Gastroenterology;;   BOTOX INJECTION Bilateral 03/03/2021   Procedure: BOTOX INJECTION;  Surgeon: Kerin Salen, MD;  [facility];  Service: Gastroenterology;  Laterality: Bilateral;   colonscopy  2014   ESOPHAGEAL MANOMETRY N/A 04/05/2016   Procedure: ESOPHAGEAL MANOMETRY (EM);  Surgeon: Charolett Bumpers, MD;  [facility];  Service: Endoscopy;  Laterality: N/A;   ESOPHAGOGASTRODUODENOSCOPY (EGD) WITH PROPOFOL N/A 03/22/2016   Procedure: ESOPHAGOGASTRODUODENOSCOPY (EGD) WITH PROPOFOL;  Surgeon: Charolett Bumpers, MD;  [facility];  Service: Endoscopy;  Laterality: N/A;    ESOPHAGOGASTRODUODENOSCOPY (EGD) WITH PROPOFOL N/A 06/01/2016   Procedure: ESOPHAGOGASTRODUODENOSCOPY (EGD) WITH PROPOFOL;  Surgeon: Charolett Bumpers, MD;  [facility];  Service: Endoscopy;  Laterality: N/A;   ESOPHAGOGASTRODUODENOSCOPY (EGD) WITH PROPOFOL Bilateral 03/03/2021   Procedure: ESOPHAGOGASTRODUODENOSCOPY (EGD) WITH PROPOFOL.;  Surgeon: Kerin Salen, MD;  [facility];  Service: Gastroenterology;  Laterality: Bilateral;   FEMUR SURGERY Right 2011   rod placed   FOREIGN BODY REMOVAL  03/03/2021   Procedure: FOREIGN BODY REMOVAL;  Surgeon: Kerin Salen, MD;  Location: [facility];  Service: Gastroenterology;;   LIGATION OF ARTERIOVENOUS  FISTULA Left 10/10/2019   Procedure: LIGATION OF LEFT ARM FISTULA;  Surgeon: Larina Earthly, MD;  Location: [facility];  Service: Vascular;  Laterality: Left;   SCLEROTHERAPY  03/03/2021   Procedure: Susa Day;  Surgeon: Kerin Salen, MD;  Location[facility];  Service: Gastroenterology;;   TUBAL LIGATION      Family History  Problem Relation Age of Onset   Diabetes Mother    Heart failure Mother    Heart failure Father     Social History   Tobacco Use   Smoking status: Never   Smokeless tobacco: Never  Vaping Use   Vaping Use: Never used  Substance Use Topics   Alcohol use: Yes    Comment: rarely drink wine   Drug use: No    ROS   Objective:   Vitals: BP 125/79 (BP Location: Right Arm)   Pulse 88   Temp 98.1 F (36.7 C) (Oral)   Resp 20   SpO2 95%   Physical Exam Constitutional:      General: She is not in acute distress.    Appearance: Normal appearance. She is well-developed. She is not ill-appearing, toxic-appearing or diaphoretic.  HENT:     Head: Normocephalic and atraumatic.     Right Ear: External ear normal.     Left Ear: External ear normal.     Nose: Nose normal.     Mouth/Throat:     Mouth: Mucous membranes are moist.  Eyes:     General: No scleral icterus.       Right eye: No discharge.         Left eye: No discharge.     Extraocular Movements: Extraocular movements intact.  Cardiovascular:     Rate and Rhythm: Normal rate.  Pulmonary:     Effort: Pulmonary effort is normal.  Musculoskeletal:     Cervical back: Normal range of motion and neck supple. No swelling, edema, deformity, erythema, signs of trauma, lacerations, rigidity, spasms, torticollis, tenderness, bony tenderness or crepitus. No pain with movement, spinous process tenderness or muscular tenderness. Normal range of motion.       Back:  Skin:    General: Skin is warm and dry.  Neurological:     General: No focal deficit present.     Mental Status: She is alert and oriented to person, place, and time.     Cranial Nerves: No cranial nerve deficit.     Motor: No weakness.     Coordination: Coordination normal.     Gait: Gait normal.     Deep Tendon Reflexes: Reflexes normal.  Psychiatric:        Mood and Affect: Mood normal.        Behavior: Behavior normal.     Assessment and Plan :   PDMP not reviewed this encounter.  1. Trapezius muscle spasm   2. Upper back pain on right side    Deferred imaging as there is no bony tenderness and there is lack of trauma. Recommended topical diclofenac at 1%. Tylenol po and will trial Flexeril 5mg  BID. Follow up with her orthopedist, [facility]. Counseled patient on potential for adverse effects with medications prescribed/recommended today, ER and return-to-clinic precautions discussed, patient verbalized understanding.    Wallis Bamberg, [place] 11/12/22 1242

## 2022-11-12 NOTE — ED Triage Notes (Signed)
Pt reports she has right shoulder pain x 3 days. Took tylenol but no relief. Pt reports no injury.

## 2022-11-23 DIAGNOSIS — E538 Deficiency of other specified B group vitamins: Secondary | ICD-10-CM | POA: Diagnosis not present

## 2022-12-02 DIAGNOSIS — E785 Hyperlipidemia, unspecified: Secondary | ICD-10-CM | POA: Diagnosis not present

## 2022-12-02 DIAGNOSIS — R32 Unspecified urinary incontinence: Secondary | ICD-10-CM | POA: Diagnosis not present

## 2022-12-02 DIAGNOSIS — K219 Gastro-esophageal reflux disease without esophagitis: Secondary | ICD-10-CM | POA: Diagnosis not present

## 2022-12-02 DIAGNOSIS — M810 Age-related osteoporosis without current pathological fracture: Secondary | ICD-10-CM | POA: Diagnosis not present

## 2022-12-02 DIAGNOSIS — N189 Chronic kidney disease, unspecified: Secondary | ICD-10-CM | POA: Diagnosis not present

## 2022-12-02 DIAGNOSIS — R269 Unspecified abnormalities of gait and mobility: Secondary | ICD-10-CM | POA: Diagnosis not present

## 2022-12-02 DIAGNOSIS — J309 Allergic rhinitis, unspecified: Secondary | ICD-10-CM | POA: Diagnosis not present

## 2022-12-02 DIAGNOSIS — F419 Anxiety disorder, unspecified: Secondary | ICD-10-CM | POA: Diagnosis not present

## 2022-12-02 DIAGNOSIS — Z79899 Other long term (current) drug therapy: Secondary | ICD-10-CM | POA: Diagnosis not present

## 2022-12-02 DIAGNOSIS — M1990 Unspecified osteoarthritis, unspecified site: Secondary | ICD-10-CM | POA: Diagnosis not present

## 2022-12-15 DIAGNOSIS — N184 Chronic kidney disease, stage 4 (severe): Secondary | ICD-10-CM | POA: Diagnosis not present

## 2022-12-15 DIAGNOSIS — E1122 Type 2 diabetes mellitus with diabetic chronic kidney disease: Secondary | ICD-10-CM | POA: Diagnosis not present

## 2022-12-15 DIAGNOSIS — N119 Chronic tubulo-interstitial nephritis, unspecified: Secondary | ICD-10-CM | POA: Diagnosis not present

## 2022-12-15 DIAGNOSIS — M810 Age-related osteoporosis without current pathological fracture: Secondary | ICD-10-CM | POA: Diagnosis not present

## 2022-12-15 DIAGNOSIS — E1129 Type 2 diabetes mellitus with other diabetic kidney complication: Secondary | ICD-10-CM | POA: Diagnosis not present

## 2022-12-15 DIAGNOSIS — D631 Anemia in chronic kidney disease: Secondary | ICD-10-CM | POA: Diagnosis not present

## 2022-12-15 DIAGNOSIS — N189 Chronic kidney disease, unspecified: Secondary | ICD-10-CM | POA: Diagnosis not present

## 2022-12-15 DIAGNOSIS — E876 Hypokalemia: Secondary | ICD-10-CM | POA: Diagnosis not present

## 2022-12-15 DIAGNOSIS — I129 Hypertensive chronic kidney disease with stage 1 through stage 4 chronic kidney disease, or unspecified chronic kidney disease: Secondary | ICD-10-CM | POA: Diagnosis not present

## 2022-12-20 DIAGNOSIS — D2272 Melanocytic nevi of left lower limb, including hip: Secondary | ICD-10-CM | POA: Diagnosis not present

## 2022-12-20 DIAGNOSIS — L821 Other seborrheic keratosis: Secondary | ICD-10-CM | POA: Diagnosis not present

## 2022-12-20 DIAGNOSIS — L814 Other melanin hyperpigmentation: Secondary | ICD-10-CM | POA: Diagnosis not present

## 2022-12-20 DIAGNOSIS — Z85828 Personal history of other malignant neoplasm of skin: Secondary | ICD-10-CM | POA: Diagnosis not present

## 2022-12-20 DIAGNOSIS — D485 Neoplasm of uncertain behavior of skin: Secondary | ICD-10-CM | POA: Diagnosis not present

## 2022-12-20 DIAGNOSIS — D225 Melanocytic nevi of trunk: Secondary | ICD-10-CM | POA: Diagnosis not present

## 2022-12-20 DIAGNOSIS — D2271 Melanocytic nevi of right lower limb, including hip: Secondary | ICD-10-CM | POA: Diagnosis not present

## 2022-12-20 DIAGNOSIS — L570 Actinic keratosis: Secondary | ICD-10-CM | POA: Diagnosis not present

## 2022-12-27 DIAGNOSIS — E538 Deficiency of other specified B group vitamins: Secondary | ICD-10-CM | POA: Diagnosis not present

## 2023-01-03 DIAGNOSIS — N184 Chronic kidney disease, stage 4 (severe): Secondary | ICD-10-CM | POA: Diagnosis not present

## 2023-01-03 DIAGNOSIS — D649 Anemia, unspecified: Secondary | ICD-10-CM | POA: Diagnosis not present

## 2023-01-03 DIAGNOSIS — R5383 Other fatigue: Secondary | ICD-10-CM | POA: Diagnosis not present

## 2023-01-07 ENCOUNTER — Other Ambulatory Visit: Payer: Self-pay | Admitting: Gastroenterology

## 2023-01-18 ENCOUNTER — Other Ambulatory Visit: Payer: Self-pay | Admitting: Internal Medicine

## 2023-01-18 DIAGNOSIS — Z1231 Encounter for screening mammogram for malignant neoplasm of breast: Secondary | ICD-10-CM

## 2023-01-27 DIAGNOSIS — E538 Deficiency of other specified B group vitamins: Secondary | ICD-10-CM | POA: Diagnosis not present

## 2023-01-27 DIAGNOSIS — E1121 Type 2 diabetes mellitus with diabetic nephropathy: Secondary | ICD-10-CM | POA: Diagnosis not present

## 2023-02-22 ENCOUNTER — Encounter (HOSPITAL_COMMUNITY): Payer: Self-pay | Admitting: Gastroenterology

## 2023-02-22 DIAGNOSIS — E119 Type 2 diabetes mellitus without complications: Secondary | ICD-10-CM | POA: Diagnosis not present

## 2023-02-23 DIAGNOSIS — M1712 Unilateral primary osteoarthritis, left knee: Secondary | ICD-10-CM | POA: Diagnosis not present

## 2023-02-23 DIAGNOSIS — R262 Difficulty in walking, not elsewhere classified: Secondary | ICD-10-CM | POA: Diagnosis not present

## 2023-02-23 DIAGNOSIS — M25662 Stiffness of left knee, not elsewhere classified: Secondary | ICD-10-CM | POA: Diagnosis not present

## 2023-02-23 DIAGNOSIS — M25562 Pain in left knee: Secondary | ICD-10-CM | POA: Diagnosis not present

## 2023-02-28 NOTE — H&P (Signed)
HPI: 77/female with known history of ACHALASIA , with dysphagia for EGD with botox injection. Last EGD with botox 10/22, had to be intubated for the procedure.She has removal of food from esophagus and had vomited bile during that procedure.  Current Medications Taking glipiZIDE 5 MG Tablet 1 tablet orally twice a day OneTouch UltraSoft 2 Lancets(Lancets) - Miscellaneous Use to check blood sugars once daily alternating between morning and evening Atorvastatin Calcium 20 MG Tablet Take 1 tablet by mouth once daily amLODIPine Besylate 10 MG Tablet Take 1 tablet by mouth once daily One Touch Ultra Blue test strips Test Strips To check blood sugars Dx: E11.21 Finger stick 2-3 times daily Xanax(ALPRAZolam) 0.25 MG Tablet 1 tablet Orally Once daily as needed , Notes to Pharmacist: rarely uses Cosamin DS(Glucosamine-Chondroitin) 500-400 MG Capsule 1 capsule with a meal Orally Once a day , Notes to Pharmacist: Glucosamine Chondroitin Klor-Con(Potassium Chloride) 20 MEQ Packet 1 tablet Orally Once a day Furosemide 40 MG Tablet TAKE 1 TABLET BY MOUTH TWICE DAILY Orally Metoprolol Succinate ER 50 MG Tablet Extended Release 24 Hour 1 tablet Orally Once a day Caltrate 600+D 600-400 MG-UNIT Tablet 1 tablet Orally once a day Fluticasone Propionate 50 MCG/ACT Suspension Use 2 spray(s) in each nostril once daily Vitamin C 1000 MG Tablet 1 tablet Orally twice a day ZyrTEC 10 MG Tablet 1 tablet Orally Once a day As needed Biotin 56387 MCG Tablet 1 tablet Orally Once a day One Touch Ultra Blue Glucometer glucometer To check blood sugars Dx: E11.21 2-3 times daily One Touch Ultra Blue Lancets Lancets To check blood sugars Dx: E11.21 Finger stick 2-3 times daily Turmeric 500 MG Capsule 2 tabs Orally daily Famotidine 20 MG Tablet 1 tablet at bedtime Orally Once a day Vitamin D3 25 MCG (1000 UT) Capsule 1 capsule Orally twice a day Not-Taking Prolia(Denosumab) 60 mg/ml Solution 60 mg SQ q 6 months Medication  List reviewed and reconciled with the patient Past Medical History Type 2 DM. HTN. DJD knees- bilateral. ortho Dr Margaretha Sheffield. opthal Dr Charise Killian. Hypercholesterolemia goal ldl less 100. Microalbuminuria 01/2010. right femoral frx: [facility]madin [facility] Dr Victorino Dike. esophageal spasm 2008 barium swallow and april 2013 botox injection 05/2016 and 02/2021 Dr Ewing Schlein. 01/2011 mild aortic stenosis repeat echo 03/2014 stable mild aortic stenosis 09/2019, mild AS 08/2021- Atherosclerosis of aorta. 01/2011 anemia iron deficiency stool cards neg 03/2014. B12 deficiency 01/2011. Diverticulosis barium enema 08/2011. 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 11/2013 decline in GFR hold hydrochlorothiazide-followup. 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 stage IV renal disease. derm DR Nicholas Lose. podiatry Dr Charlsie Merles. 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 12/2017 drop in GFR recheck in 6 weeks off hydrochlorothiazide. Normocytic normochromic anemia hemoglobin 9.8 normal iron values 07/2018. 07/2018 right renal cyst follow-up 6 months ultrasound. stage 4 renal disease Dr Glenna Fellows 07/2018 - no plan for RRT. elevated free light chain kappa Dr Alan Ripper 10/2018--Dr Candise Che. Chronic interstitial nephritis on bx. Atherosclerosis aorta on 11/2018. RT hip fracture. Osteoporosis prolia started 07/2021, cleared with nephrology. Hypercholesterolemia. Diabetic nephropathy. Achalasia. GERD. Surgical History [facility] BSO 1970 arthroscopic knee reapir- bilateral right femur frx Dr Victorino Dike- rod and screws placed 04/2010 right femur revison Dr Victorino Dike 12/2010 (L) Fistula procedure- on May 5th- currently does not have fistula 09/2019 ligation right cephalic vein due to steal syndrome from AV Fistula - Dr. Arbie Cookey 09/2019 colonoscopy 2024 Botox in throat- to help muscle relax Family History Father: deceased 106 yrs, MI, diagnosed with Coronary artery disease  Mother: deceased,  diabetes, chf, copd, diagnosed with Diabetes only child, has 2 living children one son comitted suicide after [employer] duty , daughter has diabetes, son has sleep apnea. great uncle had leukemia No known direct family hx of colon cancer, colon polyps or liver disease per pt. Social History    General:  Tobacco use  cigarettes: Former smoker Smoked cigs on and off, Quit 1970 Tobacco history last updated 01/07/2023 Additional Findings: Tobacco Non-User Non-smoker for personal reasons Vaping No EXPOSURE TO PASSIVE SMOKE: no. Alcohol: yes, occasionally, wine. Caffeine: yes. Recreational drug use: no. Marital Status: single, divorced and he later died. OCCUPATION: employed, Designer, jewellery. COMMUNICATION BARRIERS: visual impairment, wears glasses. Smoking: no. Allergies Codeine Sulfate: itch - Side Effects Augmentin: vomiting - Side Effects Hospitalization/Major Diagnostic Procedure ER visit for x-rays for broken (R) wrist- No hospital stays or admissions in past year 05/2022 none this year 12/2022 Review of Systems    GI PROCEDURE:  Pacemaker/ AICD no.  Artificial heart valves no.  MI/heart attack no.  Abnormal heart rhythm no.  Angina no.  CVA no.  Hypertension YES.  Hypotension no.  Asthma, COPD no.  Sleep apnea no.  Seizure disorders no.  Artificial joints  YES.  Severe DJD no.  Diabetes  YES, type II.  Significant headaches no.  Vertigo no.  Depression/anxiety no.  Abnormal bleeding no.  Kidney Disease YES.  Liver disease no.  Blood transfusion  YES Vital Signs Wt: 179, Wt change: 0 lbs, Ht: 64, BMI: 30.72. T 99.73F, BP 143/72, HR 67, RR 18  NAD AAOX3 Non distended abdomen No deformity  Assessments 1. Achalasia - K22.0 (Primary)    2. Esophageal dysphagia - R13.14    Treatment 1. Achalasia      IMAGING: Endoscopy Notes: Jamesetta Geralds 01/07/2023 01:19:30 PM > Pt scheduled for EGD with Botox on 03/01/2023 with Dr. Marca Ancona at [facility] ENDO .

## 2023-03-01 ENCOUNTER — Other Ambulatory Visit: Payer: Self-pay

## 2023-03-01 ENCOUNTER — Ambulatory Visit (HOSPITAL_COMMUNITY): Payer: Medicare HMO

## 2023-03-01 ENCOUNTER — Encounter (HOSPITAL_COMMUNITY): Payer: Self-pay | Admitting: Gastroenterology

## 2023-03-01 ENCOUNTER — Encounter (HOSPITAL_COMMUNITY)
Admission: RE | Disposition: A | Discharge status: Home / Self Care | Payer: Self-pay | Source: Home / Self Care | Attending: Gastroenterology

## 2023-03-01 ENCOUNTER — Ambulatory Visit (HOSPITAL_COMMUNITY)
Admission: RE | Admit: 2023-03-01 | Discharge: 2023-03-01 | Disposition: A | Discharge status: Home / Self Care | Payer: Medicare HMO | Attending: Gastroenterology | Admitting: Gastroenterology

## 2023-03-01 DIAGNOSIS — K3189 Other diseases of stomach and duodenum: Secondary | ICD-10-CM | POA: Diagnosis not present

## 2023-03-01 DIAGNOSIS — Q399 Congenital malformation of esophagus, unspecified: Secondary | ICD-10-CM | POA: Insufficient documentation

## 2023-03-01 DIAGNOSIS — K5710 Diverticulosis of small intestine without perforation or abscess without bleeding: Secondary | ICD-10-CM | POA: Insufficient documentation

## 2023-03-01 DIAGNOSIS — Z87891 Personal history of nicotine dependence: Secondary | ICD-10-CM | POA: Diagnosis not present

## 2023-03-01 DIAGNOSIS — K220 Achalasia of cardia: Secondary | ICD-10-CM | POA: Diagnosis not present

## 2023-03-01 DIAGNOSIS — E1122 Type 2 diabetes mellitus with diabetic chronic kidney disease: Secondary | ICD-10-CM | POA: Insufficient documentation

## 2023-03-01 DIAGNOSIS — M810 Age-related osteoporosis without current pathological fracture: Secondary | ICD-10-CM | POA: Insufficient documentation

## 2023-03-01 DIAGNOSIS — I129 Hypertensive chronic kidney disease with stage 1 through stage 4 chronic kidney disease, or unspecified chronic kidney disease: Secondary | ICD-10-CM | POA: Diagnosis not present

## 2023-03-01 DIAGNOSIS — I10 Essential (primary) hypertension: Secondary | ICD-10-CM | POA: Diagnosis not present

## 2023-03-01 DIAGNOSIS — R1310 Dysphagia, unspecified: Secondary | ICD-10-CM | POA: Insufficient documentation

## 2023-03-01 DIAGNOSIS — Z7984 Long term (current) use of oral hypoglycemic drugs: Secondary | ICD-10-CM | POA: Diagnosis not present

## 2023-03-01 DIAGNOSIS — K319 Disease of stomach and duodenum, unspecified: Secondary | ICD-10-CM | POA: Diagnosis not present

## 2023-03-01 DIAGNOSIS — I700 Atherosclerosis of aorta: Secondary | ICD-10-CM | POA: Insufficient documentation

## 2023-03-01 DIAGNOSIS — K219 Gastro-esophageal reflux disease without esophagitis: Secondary | ICD-10-CM | POA: Insufficient documentation

## 2023-03-01 DIAGNOSIS — E7800 Pure hypercholesterolemia, unspecified: Secondary | ICD-10-CM | POA: Insufficient documentation

## 2023-03-01 DIAGNOSIS — K2289 Other specified disease of esophagus: Secondary | ICD-10-CM

## 2023-03-01 DIAGNOSIS — N189 Chronic kidney disease, unspecified: Secondary | ICD-10-CM | POA: Diagnosis not present

## 2023-03-01 HISTORY — PX: BIOPSY: SHX5522

## 2023-03-01 HISTORY — PX: ESOPHAGOGASTRODUODENOSCOPY (EGD) WITH PROPOFOL: SHX5813

## 2023-03-01 HISTORY — PX: SUBMUCOSAL INJECTION: SHX5543

## 2023-03-01 LAB — GLUCOSE, CAPILLARY: Glucose-Capillary: 161 mg/dL — ABNORMAL HIGH (ref 70–99)

## 2023-03-01 SURGERY — ESOPHAGOGASTRODUODENOSCOPY (EGD) WITH PROPOFOL
Anesthesia: Monitor Anesthesia Care

## 2023-03-01 MED ORDER — ONABOTULINUMTOXINA 100 UNITS IJ SOLR
INTRAMUSCULAR | Status: AC
  Filled 2023-03-01: qty 100

## 2023-03-01 MED ORDER — LACTATED RINGERS IV SOLN
INTRAVENOUS | Status: AC | PRN
  Administered 2023-03-01: 1000 mL via INTRAVENOUS

## 2023-03-01 MED ORDER — SODIUM CHLORIDE (PF) 0.9 % IJ SOLN
INTRAMUSCULAR | Status: DC | PRN
  Administered 2023-03-01: 4 mL via SUBMUCOSAL

## 2023-03-01 MED ORDER — PROPOFOL 10 MG/ML IV BOLUS
INTRAVENOUS | Status: DC | PRN
  Administered 2023-03-01: 20 mg via INTRAVENOUS
  Administered 2023-03-01 (×2): 10 mg via INTRAVENOUS
  Administered 2023-03-01: 30 mg via INTRAVENOUS
  Administered 2023-03-01 (×2): 20 mg via INTRAVENOUS

## 2023-03-01 MED ORDER — SODIUM CHLORIDE 0.9 % IV SOLN: INTRAVENOUS | Status: DC

## 2023-03-01 MED ORDER — SODIUM CHLORIDE (PF) 0.9 % IJ SOLN
INTRAMUSCULAR | Status: AC
  Filled 2023-03-01: qty 10

## 2023-03-01 MED ORDER — PROPOFOL 500 MG/50ML IV EMUL
INTRAVENOUS | Status: DC | PRN
  Administered 2023-03-01: 125 ug/kg/min via INTRAVENOUS

## 2023-03-01 MED ORDER — PROPOFOL 500 MG/50ML IV EMUL
INTRAVENOUS | Status: AC
  Filled 2023-03-01: qty 50

## 2023-03-01 SURGICAL SUPPLY — 15 items

## 2023-03-01 NOTE — Op Note (Signed)
[facility] Patient Name: Beth Malone Procedure Date: 03/01/2023 MRN: [id number] Attending MD: Kerin Salen , MD, [id number] Date of Birth: 13-Jun-1945 CSN: [id number] Age: 77 Admit Type: Outpatient Procedure:                Upper GI endoscopy Indications:              Dysphagia, For botulinum toxin injection of                            achalasia Providers:                Kerin Salen, MD, Suzy Bouchard, RN, Jacquelyn "Jaci"                            Clelia Croft, RN, Norman Clay, RN, Salley Scarlet,                            Technician, Arne Cleveland, CRNA Referring MD:             Hillard Danker, MD Medicines:                Monitored Anesthesia Care Complications:            No immediate complications. Estimated blood loss:                            Minimal. Estimated Blood Loss:     Estimated blood loss was minimal. Procedure:                Pre-Anesthesia Assessment:                           - Prior to the procedure, a History and Physical                            was performed, and patient medications and                            allergies were reviewed. The patient's tolerance of                            previous anesthesia was also reviewed. The risks                            and benefits of the procedure and the sedation                            options and risks were discussed with the patient.                            All questions were answered, and informed consent                            was obtained. Prior Anticoagulants: The patient has  taken no anticoagulant or antiplatelet agents. ASA                            Grade Assessment: II - A patient with mild systemic                            disease. After reviewing the risks and benefits,                            the patient was deemed in satisfactory condition to                            undergo the procedure.                           After obtaining informed consent,  the endoscope was                            passed under direct vision. Throughout the                            procedure, the patient's blood pressure, pulse, and                            oxygen saturations were monitored continuously. The                            [id number]-[id number] ([id number]) Olympus endoscope was introduced                            through the mouth, and advanced to the second part                            of duodenum. The upper GI endoscopy was                            accomplished without difficulty. The patient                            tolerated the procedure well. Scope In: Scope Out: Findings:      The lumen of the upper third of the esophagus was moderately dilated.      The lower third of the esophagus was moderately tortuous.      An area in the lower third of the esophagus, 1 cm above the GE junction,       was successfully injected with 100 units botulinum toxin, 25 units/ml,       in a four quadrant fashion.      Patchy moderate mucosal changes characterized by nodularity were found       in the gastric antrum. Biopsies were taken with a cold forceps for       histology.      The cardia and gastric fundus were normal on retroflexion.      A medium non-bleeding diverticulum was found in the second portion of  the duodenum.      The ampulla, duodenal bulb and first portion of the duodenum were normal. Impression:               - Dilation in the upper third of the esophagus.                           - Tortuous esophagus.                           - Nodular mucosa in the antrum. Biopsied.                           - Non-bleeding duodenal diverticulum.                           - Normal ampulla, duodenal bulb and first portion                            of the duodenum.                           - An area in the lower third of the esophagus                            successfully injected with botulinum toxin. Moderate Sedation:      Patient did  not receive moderate sedation for this procedure, but       instead received monitored anesthesia care. Recommendation:           - Patient has a contact number available for                            emergencies. The signs and symptoms of potential                            delayed complications were discussed with the                            patient. Return to normal activities tomorrow.                            Written discharge instructions were provided to the                            patient.                           - Advance diet as tolerated.                           - Continue present medications.                           - Await pathology results.                           - Repeat upper endoscopy depending on  her                            symtpoms/recurrence of dysphagia. Procedure Code(s):        --- Professional ---                           [phone], Esophagogastroduodenoscopy, flexible,                            transoral; with biopsy, single or multiple                           43236, 59, Esophagogastroduodenoscopy, flexible,                            transoral; with directed submucosal injection(s),                            any substance Diagnosis Code(s):        --- Professional ---                           K22.89, Other specified disease of esophagus                           Q39.9, Congenital malformation of esophagus,                            unspecified                           K31.89, Other diseases of stomach and duodenum                           R13.10, Dysphagia, unspecified                           K22.0, Achalasia of cardia                           K57.10, Diverticulosis of small intestine without                            perforation or abscess without bleeding CPT copyright 2022 American Medical Association. All rights reserved. The codes documented in this report are preliminary and upon coder review may  be revised to meet current  compliance requirements. Kerin Salen, MD 03/01/2023 9:02:02 AM This report has been signed electronically. Number of Addenda: 0

## 2023-03-01 NOTE — Interval H&P Note (Signed)
History and Physical Interval Note: 77/female with achalasis for EGD with botox injection with propofol.  03/01/2023 8:16 AM  Beth Malone  has presented today for EGD with botox, with the diagnosis of achalasia.  The various methods of treatment have been discussed with the patient and family. After consideration of risks, benefits and other options for treatment, the patient has consented to  Procedure(s) with comments: ESOPHAGOGASTRODUODENOSCOPY (EGD) WITH PROPOFOL (N/A) - with botox injections as a surgical intervention.  The patient's history has been reviewed, patient examined, no change in status, stable for surgery.  I have reviewed the patient's chart and labs.  Questions were answered to the patient's satisfaction.     Kerin Salen

## 2023-03-01 NOTE — Transfer of Care (Signed)
Immediate Anesthesia Transfer of Care Note  Patient: Beth Malone  Procedure(s) Performed: ESOPHAGOGASTRODUODENOSCOPY (EGD) WITH PROPOFOL BIOPSY SUBMUCOSAL INJECTION  Patient Location: PACU  Anesthesia Type:MAC  Level of Consciousness: awake and alert   Airway & Oxygen Therapy: Patient Spontanous Breathing  Post-op Assessment: Report given to RN and Post -op Vital signs reviewed and stable  Post vital signs: Reviewed and stable  Last Vitals:  Vitals Value Taken Time  BP 115/59 03/01/23 0920  Temp 36.3 C 03/01/23 0905  Pulse 69 03/01/23 0925  Resp 19 03/01/23 0925  SpO2 95 % 03/01/23 0925  Vitals shown include unfiled device data.  Last Pain:  Vitals:   03/01/23 0920  TempSrc:   PainSc: 0-No pain         Complications: No notable events documented.

## 2023-03-01 NOTE — Anesthesia Preprocedure Evaluation (Signed)
Anesthesia Evaluation  Patient identified by MRN, date of birth, ID band Patient awake    Reviewed: Allergy & Precautions, NPO status , Patient's Chart, lab work & pertinent test results  Airway Mallampati: II  TM Distance: >3 FB Neck ROM: Full    Dental no notable dental hx.    Pulmonary neg pulmonary ROS   Pulmonary exam normal        Cardiovascular hypertension, Pt. on medications and Pt. on home beta blockers  Rhythm:Regular Rate:Normal     Neuro/Psych   Anxiety     negative neurological ROS     GI/Hepatic Neg liver ROS,GERD  ,,  Endo/Other  diabetes, Type 2, Oral Hypoglycemic Agents    Renal/GU CRFRenal disease  negative genitourinary   Musculoskeletal  (+) Arthritis , Osteoarthritis,    Abdominal Normal abdominal exam  (+)   Peds  Hematology  (+) Blood dyscrasia, anemia   Anesthesia Other Findings   Reproductive/Obstetrics                             Anesthesia Physical Anesthesia Plan  ASA: 2  Anesthesia Plan: MAC   Post-op Pain Management:    Induction: Intravenous  PONV Risk Score and Plan: 2 and Propofol infusion and Treatment may vary due to age or medical condition  Airway Management Planned: Simple Face Mask and Nasal Cannula  Additional Equipment: None  Intra-op Plan:   Post-operative Plan:   Informed Consent: I have reviewed the patients History and Physical, chart, labs and discussed the procedure including the risks, benefits and alternatives for the proposed anesthesia with the patient or authorized representative who has indicated his/her understanding and acceptance.     Dental advisory given  Plan Discussed with:   Anesthesia Plan Comments:        Anesthesia Quick Evaluation

## 2023-03-01 NOTE — Discharge Instructions (Signed)

## 2023-03-01 NOTE — Anesthesia Postprocedure Evaluation (Signed)
Anesthesia Post Note  Patient: Beth Malone  Procedure(s) Performed: ESOPHAGOGASTRODUODENOSCOPY (EGD) WITH PROPOFOL BIOPSY SUBMUCOSAL INJECTION     Patient location during evaluation: PACU Anesthesia Type: MAC Level of consciousness: awake and alert Pain management: pain level controlled Vital Signs Assessment: post-procedure vital signs reviewed and stable Respiratory status: spontaneous breathing, nonlabored ventilation, respiratory function stable and patient connected to nasal cannula oxygen Cardiovascular status: stable and blood pressure returned to baseline Postop Assessment: no apparent nausea or vomiting Anesthetic complications: no   No notable events documented.  Last Vitals:  Vitals:   03/01/23 0920 03/01/23 0930  BP: (!) 115/59 (!) 127/58  Pulse: 74 72  Resp: (!) 23 (!) 22  Temp:    SpO2: 96% 97%    Last Pain:  Vitals:   03/01/23 0930  TempSrc:   PainSc: 0-No pain                 Earl Lites P Aleem Elza

## 2023-03-02 LAB — SURGICAL PATHOLOGY

## 2023-03-03 ENCOUNTER — Encounter (HOSPITAL_COMMUNITY): Payer: Self-pay | Admitting: Gastroenterology

## 2023-03-04 ENCOUNTER — Ambulatory Visit
Admission: RE | Admit: 2023-03-04 | Discharge: 2023-03-04 | Disposition: A | Discharge status: Home / Self Care | Payer: Medicare HMO | Source: Ambulatory Visit | Attending: Internal Medicine | Admitting: Internal Medicine

## 2023-03-04 DIAGNOSIS — E538 Deficiency of other specified B group vitamins: Secondary | ICD-10-CM | POA: Diagnosis not present

## 2023-03-04 DIAGNOSIS — Z1231 Encounter for screening mammogram for malignant neoplasm of breast: Secondary | ICD-10-CM

## 2023-03-18 DIAGNOSIS — M810 Age-related osteoporosis without current pathological fracture: Secondary | ICD-10-CM | POA: Diagnosis not present

## 2023-03-18 DIAGNOSIS — Z9181 History of falling: Secondary | ICD-10-CM | POA: Diagnosis not present

## 2023-03-18 DIAGNOSIS — E1122 Type 2 diabetes mellitus with diabetic chronic kidney disease: Secondary | ICD-10-CM | POA: Diagnosis not present

## 2023-03-18 DIAGNOSIS — I350 Nonrheumatic aortic (valve) stenosis: Secondary | ICD-10-CM | POA: Diagnosis not present

## 2023-03-18 DIAGNOSIS — I129 Hypertensive chronic kidney disease with stage 1 through stage 4 chronic kidney disease, or unspecified chronic kidney disease: Secondary | ICD-10-CM | POA: Diagnosis not present

## 2023-03-18 DIAGNOSIS — N184 Chronic kidney disease, stage 4 (severe): Secondary | ICD-10-CM | POA: Diagnosis not present

## 2023-03-18 DIAGNOSIS — E538 Deficiency of other specified B group vitamins: Secondary | ICD-10-CM | POA: Diagnosis not present

## 2023-03-18 DIAGNOSIS — D631 Anemia in chronic kidney disease: Secondary | ICD-10-CM | POA: Diagnosis not present

## 2023-03-18 DIAGNOSIS — I700 Atherosclerosis of aorta: Secondary | ICD-10-CM | POA: Diagnosis not present

## 2023-03-18 DIAGNOSIS — J310 Chronic rhinitis: Secondary | ICD-10-CM | POA: Diagnosis not present

## 2023-03-18 DIAGNOSIS — Z0000 Encounter for general adult medical examination without abnormal findings: Secondary | ICD-10-CM | POA: Diagnosis not present

## 2023-03-18 DIAGNOSIS — Z1331 Encounter for screening for depression: Secondary | ICD-10-CM | POA: Diagnosis not present

## 2023-04-08 DIAGNOSIS — H524 Presbyopia: Secondary | ICD-10-CM | POA: Diagnosis not present

## 2023-04-08 DIAGNOSIS — H52223 Regular astigmatism, bilateral: Secondary | ICD-10-CM | POA: Diagnosis not present

## 2023-04-08 DIAGNOSIS — E538 Deficiency of other specified B group vitamins: Secondary | ICD-10-CM | POA: Diagnosis not present

## 2023-04-15 DIAGNOSIS — M9905 Segmental and somatic dysfunction of pelvic region: Secondary | ICD-10-CM | POA: Diagnosis not present

## 2023-04-15 DIAGNOSIS — M9903 Segmental and somatic dysfunction of lumbar region: Secondary | ICD-10-CM | POA: Diagnosis not present

## 2023-04-15 DIAGNOSIS — M62830 Muscle spasm of back: Secondary | ICD-10-CM | POA: Diagnosis not present

## 2023-04-15 DIAGNOSIS — M9902 Segmental and somatic dysfunction of thoracic region: Secondary | ICD-10-CM | POA: Diagnosis not present

## 2023-04-22 DIAGNOSIS — M9905 Segmental and somatic dysfunction of pelvic region: Secondary | ICD-10-CM | POA: Diagnosis not present

## 2023-04-22 DIAGNOSIS — M9903 Segmental and somatic dysfunction of lumbar region: Secondary | ICD-10-CM | POA: Diagnosis not present

## 2023-04-22 DIAGNOSIS — M9902 Segmental and somatic dysfunction of thoracic region: Secondary | ICD-10-CM | POA: Diagnosis not present

## 2023-04-22 DIAGNOSIS — M62830 Muscle spasm of back: Secondary | ICD-10-CM | POA: Diagnosis not present

## 2023-04-26 DIAGNOSIS — E876 Hypokalemia: Secondary | ICD-10-CM | POA: Diagnosis not present

## 2023-04-26 DIAGNOSIS — N184 Chronic kidney disease, stage 4 (severe): Secondary | ICD-10-CM | POA: Diagnosis not present

## 2023-04-26 DIAGNOSIS — D631 Anemia in chronic kidney disease: Secondary | ICD-10-CM | POA: Diagnosis not present

## 2023-04-26 DIAGNOSIS — I129 Hypertensive chronic kidney disease with stage 1 through stage 4 chronic kidney disease, or unspecified chronic kidney disease: Secondary | ICD-10-CM | POA: Diagnosis not present

## 2023-04-26 DIAGNOSIS — N189 Chronic kidney disease, unspecified: Secondary | ICD-10-CM | POA: Diagnosis not present

## 2023-04-26 DIAGNOSIS — E1129 Type 2 diabetes mellitus with other diabetic kidney complication: Secondary | ICD-10-CM | POA: Diagnosis not present

## 2023-04-26 DIAGNOSIS — M810 Age-related osteoporosis without current pathological fracture: Secondary | ICD-10-CM | POA: Diagnosis not present

## 2023-04-26 DIAGNOSIS — E1122 Type 2 diabetes mellitus with diabetic chronic kidney disease: Secondary | ICD-10-CM | POA: Diagnosis not present

## 2023-04-26 DIAGNOSIS — N119 Chronic tubulo-interstitial nephritis, unspecified: Secondary | ICD-10-CM | POA: Diagnosis not present

## 2023-04-27 DIAGNOSIS — M9903 Segmental and somatic dysfunction of lumbar region: Secondary | ICD-10-CM | POA: Diagnosis not present

## 2023-04-27 DIAGNOSIS — M9905 Segmental and somatic dysfunction of pelvic region: Secondary | ICD-10-CM | POA: Diagnosis not present

## 2023-04-27 DIAGNOSIS — M62830 Muscle spasm of back: Secondary | ICD-10-CM | POA: Diagnosis not present

## 2023-04-27 DIAGNOSIS — M9902 Segmental and somatic dysfunction of thoracic region: Secondary | ICD-10-CM | POA: Diagnosis not present

## 2023-05-09 DIAGNOSIS — E538 Deficiency of other specified B group vitamins: Secondary | ICD-10-CM | POA: Diagnosis not present

## 2023-06-09 DIAGNOSIS — E538 Deficiency of other specified B group vitamins: Secondary | ICD-10-CM | POA: Diagnosis not present

## 2023-07-11 DIAGNOSIS — L989 Disorder of the skin and subcutaneous tissue, unspecified: Secondary | ICD-10-CM | POA: Diagnosis not present

## 2023-07-11 DIAGNOSIS — J3489 Other specified disorders of nose and nasal sinuses: Secondary | ICD-10-CM | POA: Diagnosis not present

## 2023-07-11 DIAGNOSIS — E538 Deficiency of other specified B group vitamins: Secondary | ICD-10-CM | POA: Diagnosis not present

## 2023-08-09 DIAGNOSIS — E119 Type 2 diabetes mellitus without complications: Secondary | ICD-10-CM | POA: Diagnosis not present

## 2023-08-12 DIAGNOSIS — E538 Deficiency of other specified B group vitamins: Secondary | ICD-10-CM | POA: Diagnosis not present

## 2023-08-22 DIAGNOSIS — I129 Hypertensive chronic kidney disease with stage 1 through stage 4 chronic kidney disease, or unspecified chronic kidney disease: Secondary | ICD-10-CM | POA: Diagnosis not present

## 2023-08-22 DIAGNOSIS — E876 Hypokalemia: Secondary | ICD-10-CM | POA: Diagnosis not present

## 2023-08-22 DIAGNOSIS — E1122 Type 2 diabetes mellitus with diabetic chronic kidney disease: Secondary | ICD-10-CM | POA: Diagnosis not present

## 2023-08-22 DIAGNOSIS — E1129 Type 2 diabetes mellitus with other diabetic kidney complication: Secondary | ICD-10-CM | POA: Diagnosis not present

## 2023-08-22 DIAGNOSIS — D631 Anemia in chronic kidney disease: Secondary | ICD-10-CM | POA: Diagnosis not present

## 2023-08-22 DIAGNOSIS — M810 Age-related osteoporosis without current pathological fracture: Secondary | ICD-10-CM | POA: Diagnosis not present

## 2023-08-22 DIAGNOSIS — N119 Chronic tubulo-interstitial nephritis, unspecified: Secondary | ICD-10-CM | POA: Diagnosis not present

## 2023-08-22 DIAGNOSIS — N184 Chronic kidney disease, stage 4 (severe): Secondary | ICD-10-CM | POA: Diagnosis not present

## 2023-08-22 DIAGNOSIS — N189 Chronic kidney disease, unspecified: Secondary | ICD-10-CM | POA: Diagnosis not present

## 2023-09-15 DIAGNOSIS — I129 Hypertensive chronic kidney disease with stage 1 through stage 4 chronic kidney disease, or unspecified chronic kidney disease: Secondary | ICD-10-CM | POA: Diagnosis not present

## 2023-09-15 DIAGNOSIS — E538 Deficiency of other specified B group vitamins: Secondary | ICD-10-CM | POA: Diagnosis not present

## 2023-09-15 DIAGNOSIS — E1121 Type 2 diabetes mellitus with diabetic nephropathy: Secondary | ICD-10-CM | POA: Diagnosis not present

## 2023-09-15 DIAGNOSIS — N184 Chronic kidney disease, stage 4 (severe): Secondary | ICD-10-CM | POA: Diagnosis not present

## 2023-09-15 DIAGNOSIS — I350 Nonrheumatic aortic (valve) stenosis: Secondary | ICD-10-CM | POA: Diagnosis not present

## 2023-09-15 DIAGNOSIS — Z711 Person with feared health complaint in whom no diagnosis is made: Secondary | ICD-10-CM | POA: Diagnosis not present

## 2023-09-15 DIAGNOSIS — D631 Anemia in chronic kidney disease: Secondary | ICD-10-CM | POA: Diagnosis not present

## 2023-10-10 DIAGNOSIS — N184 Chronic kidney disease, stage 4 (severe): Secondary | ICD-10-CM | POA: Diagnosis not present

## 2023-10-13 DIAGNOSIS — I350 Nonrheumatic aortic (valve) stenosis: Secondary | ICD-10-CM | POA: Diagnosis not present

## 2023-10-21 DIAGNOSIS — E538 Deficiency of other specified B group vitamins: Secondary | ICD-10-CM | POA: Diagnosis not present

## 2023-11-21 DIAGNOSIS — E538 Deficiency of other specified B group vitamins: Secondary | ICD-10-CM | POA: Diagnosis not present

## 2023-11-23 DIAGNOSIS — M5459 Other low back pain: Secondary | ICD-10-CM | POA: Diagnosis not present

## 2023-12-12 DIAGNOSIS — E876 Hypokalemia: Secondary | ICD-10-CM | POA: Diagnosis not present

## 2023-12-12 DIAGNOSIS — E1122 Type 2 diabetes mellitus with diabetic chronic kidney disease: Secondary | ICD-10-CM | POA: Diagnosis not present

## 2023-12-12 DIAGNOSIS — N189 Chronic kidney disease, unspecified: Secondary | ICD-10-CM | POA: Diagnosis not present

## 2023-12-12 DIAGNOSIS — N184 Chronic kidney disease, stage 4 (severe): Secondary | ICD-10-CM | POA: Diagnosis not present

## 2023-12-12 DIAGNOSIS — I10 Essential (primary) hypertension: Secondary | ICD-10-CM | POA: Diagnosis not present

## 2023-12-12 DIAGNOSIS — N119 Chronic tubulo-interstitial nephritis, unspecified: Secondary | ICD-10-CM | POA: Diagnosis not present

## 2023-12-12 DIAGNOSIS — D631 Anemia in chronic kidney disease: Secondary | ICD-10-CM | POA: Diagnosis not present

## 2023-12-20 DIAGNOSIS — L821 Other seborrheic keratosis: Secondary | ICD-10-CM | POA: Diagnosis not present

## 2023-12-20 DIAGNOSIS — L570 Actinic keratosis: Secondary | ICD-10-CM | POA: Diagnosis not present

## 2023-12-20 DIAGNOSIS — D225 Melanocytic nevi of trunk: Secondary | ICD-10-CM | POA: Diagnosis not present

## 2023-12-20 DIAGNOSIS — Z85828 Personal history of other malignant neoplasm of skin: Secondary | ICD-10-CM | POA: Diagnosis not present

## 2023-12-20 DIAGNOSIS — L814 Other melanin hyperpigmentation: Secondary | ICD-10-CM | POA: Diagnosis not present

## 2023-12-22 DIAGNOSIS — E538 Deficiency of other specified B group vitamins: Secondary | ICD-10-CM | POA: Diagnosis not present

## 2024-01-10 ENCOUNTER — Encounter: Payer: Self-pay | Admitting: Internal Medicine

## 2024-01-10 ENCOUNTER — Ambulatory Visit: Attending: Internal Medicine | Admitting: Internal Medicine

## 2024-01-10 VITALS — BP 122/80 | HR 75 | Ht 64.0 in | Wt 179.4 lb

## 2024-01-10 DIAGNOSIS — I350 Nonrheumatic aortic (valve) stenosis: Secondary | ICD-10-CM | POA: Insufficient documentation

## 2024-01-10 DIAGNOSIS — I10 Essential (primary) hypertension: Secondary | ICD-10-CM | POA: Diagnosis not present

## 2024-01-10 DIAGNOSIS — Z136 Encounter for screening for cardiovascular disorders: Secondary | ICD-10-CM | POA: Diagnosis not present

## 2024-01-10 DIAGNOSIS — E785 Hyperlipidemia, unspecified: Secondary | ICD-10-CM | POA: Insufficient documentation

## 2024-01-10 DIAGNOSIS — E7849 Other hyperlipidemia: Secondary | ICD-10-CM

## 2024-01-10 DIAGNOSIS — Z79899 Other long term (current) drug therapy: Secondary | ICD-10-CM | POA: Diagnosis not present

## 2024-01-10 NOTE — Patient Instructions (Addendum)
 Medication Instructions:   Continue all current medications.    Labwork:  BNP - order given today  Testing/Procedures:  Your physician has requested that you have an echocardiogram. Echocardiography is a painless test that uses sound waves to create images of your heart. It provides your doctor with information about the size and shape of your heart and how well your heart's chambers and valves are working. This procedure takes approximately one hour. There are no restrictions for this procedure. Please do NOT wear cologne, perfume, aftershave, or lotions (deodorant is allowed). Please arrive 15 minutes prior to your appointment time.  Please note: We ask at that you not bring children with you during ultrasound (echo/ vascular) testing. Due to room size and safety concerns, children are not allowed in the ultrasound rooms during exams. Our front office staff cannot provide observation of children in our lobby area while testing is being conducted. An adult accompanying a patient to their appointment will only be allowed in the ultrasound room at the discretion of the ultrasound technician under special circumstances. We apologize for any inconvenience. DUE IN DECEMBER 2025  Follow-Up:  Office will contact with results via phone, letter or mychart.    6 months   Any Other Special Instructions Will Be Listed Below (If Applicable).   If you need a refill on your cardiac medications before your next appointment, please call your pharmacy.

## 2024-01-10 NOTE — Progress Notes (Signed)
 Cardiology Office Note  Date: 01/10/2024   ID: Beth, Malone 07-23-1945, MRN [id number]  PCP:  Dwight Trula SQUIBB, MD  Cardiologist:  None Electrophysiologist:  None   History of Present Illness: Beth Malone is a 78 y.o. female  Referred to cardiology clinic for evaluation of moderate aortic valve stenosis.  Patient's PCP has been monitoring her aortic valve stenosis, it has progressed to mild to moderate from last year.  I reviewed her echocardiograms.  Echocardiogram from May 2025 showed normal LVEF, G2 DD, moderate aortic valve stenosis.  Patient has been asymptomatic.  No angina or DOE.  No dizziness/lightheadedness, syncope, palpitations or leg swelling.  Active at baseline.  Walks for 30 minutes daily with no symptoms.  Past Medical History:  Diagnosis Date   Anemia    Anxiety    Arthritis    Chronic kidney disease    sees [facility]   Diabetes mellitus    type II   Dysphagia    last year or so    Femur fracture (HCC)    GERD (gastroesophageal reflux disease)    Heart murmur    History of blood transfusion    tumor on tube and    History of kidney stones    stones in kidney   Hypertension     Past Surgical History:  Procedure Laterality Date   ABDOMINAL HYSTERECTOMY     complete   AV FISTULA PLACEMENT Left 10/03/2019   Procedure: LEFT ARM ARTERIOVENOUS (AV) FISTULA CREATION;  Surgeon: Sheree Penne Bruckner, MD;  Location: [facility] OR;  Service: Vascular;  Laterality: Left;   BALLOON DILATION N/A 03/22/2016   Procedure: BALLOON DILATION;  Surgeon: Gladis MARLA Louder, MD;  Location: [facility];  Service: Endoscopy;  Laterality: N/A;   BALLOON DILATION Bilateral 03/03/2021   Procedure: BALLOON DILATION;  Surgeon: Saintclair Jasper, MD;  Location: [facility];  Service: Gastroenterology;  Laterality: Bilateral;   BIOPSY  03/03/2021   Procedure: BIOPSY;  Surgeon: Saintclair Jasper, MD;  Location: [facility];  Service: Gastroenterology;;   BIOPSY   03/01/2023   Procedure: BIOPSY;  Surgeon: Saintclair Jasper, MD;  Location: [facility];  Service: Gastroenterology;;   BOTOX  INJECTION Bilateral 03/03/2021   Procedure: BOTOX  INJECTION;  Surgeon: Saintclair Jasper, MD;  Location: [facility];  Service: Gastroenterology;  Laterality: Bilateral;   colonscopy  2014   ESOPHAGEAL MANOMETRY N/A 04/05/2016   Procedure: ESOPHAGEAL MANOMETRY (EM);  Surgeon: Gladis MARLA Louder, MD;  Location: [facility];  Service: Endoscopy;  Laterality: N/A;   ESOPHAGOGASTRODUODENOSCOPY (EGD) WITH PROPOFOL  N/A 03/22/2016   Procedure: ESOPHAGOGASTRODUODENOSCOPY (EGD) WITH PROPOFOL ;  Surgeon: Gladis MARLA Louder, MD;  Location: [facility];  Service: Endoscopy;  Laterality: N/A;   ESOPHAGOGASTRODUODENOSCOPY (EGD) WITH PROPOFOL  N/A 06/01/2016   Procedure: ESOPHAGOGASTRODUODENOSCOPY (EGD) WITH PROPOFOL ;  Surgeon: Gladis MARLA Louder, MD;  Location: [facility];  Service: Endoscopy;  Laterality: N/A;   ESOPHAGOGASTRODUODENOSCOPY (EGD) WITH PROPOFOL  Bilateral 03/03/2021   Procedure: ESOPHAGOGASTRODUODENOSCOPY (EGD) WITH PROPOFOL .;  Surgeon: Saintclair Jasper, MD;  Location: [facility];  Service: Gastroenterology;  Laterality: Bilateral;   ESOPHAGOGASTRODUODENOSCOPY (EGD) WITH PROPOFOL  N/A 03/01/2023   Procedure: ESOPHAGOGASTRODUODENOSCOPY (EGD) WITH PROPOFOL ;  Surgeon: Saintclair Jasper, MD;  Location: [facility];  Service: Gastroenterology;  Laterality: N/A;  with botox  injections   FEMUR SURGERY Right 2011   rod placed   FOREIGN BODY REMOVAL  03/03/2021   Procedure: FOREIGN BODY REMOVAL;  Surgeon: Saintclair Jasper, MD;  Location: [facility];  Service: Gastroenterology;;   LIGATION OF ARTERIOVENOUS  FISTULA  Left 10/10/2019   Procedure: LIGATION OF LEFT ARM FISTULA;  Surgeon: Oris Krystal FALCON, MD;  Location: [facility] OR;  Service: Vascular;  Laterality: Left;   SCLEROTHERAPY  03/03/2021   Procedure: MATIAS;  Surgeon: Saintclair Jasper, MD;  Location: [facility];  Service: Gastroenterology;;   SUBMUCOSAL INJECTION   03/01/2023   Procedure: SUBMUCOSAL INJECTION;  Surgeon: Saintclair Jasper, MD;  Location: [facility];  Service: Gastroenterology;;   TUBAL LIGATION      Current Outpatient Medications  Medication Sig Dispense Refill   acetaminophen  (TYLENOL ) 650 MG CR tablet Take 1,300 mg by mouth 2 (two) times daily.     ALPRAZolam (XANAX) 0.25 MG tablet Take 0.25 mg by mouth daily as needed for anxiety. anxiety      amLODipine (NORVASC) 10 MG tablet Take 10 mg by mouth daily.     ascorbic acid (VITAMIN C) 1000 MG tablet Take 1,000 mg by mouth daily.     atorvastatin (LIPITOR) 20 MG tablet Take 20 mg by mouth at bedtime.      Biotin 89999 MCG TABS Take 10,000 mcg by mouth daily.     cetirizine (ZYRTEC) 10 MG tablet Take 10 mg by mouth daily.     cyclobenzaprine  (FLEXERIL ) 5 MG tablet Take 1 tablet (5 mg total) by mouth 2 (two) times daily as needed for muscle spasms. 30 tablet 0   diclofenac  Sodium (VOLTAREN ) 1 % GEL Apply 2 g topically 3 (three) times daily as needed. 100 g 0   famotidine (PEPCID) 20 MG tablet Take 40 mg by mouth 2 (two) times daily.      fluticasone (FLONASE) 50 MCG/ACT nasal spray Place 2 sprays into both nostrils 2 (two) times daily.     furosemide  (LASIX ) 20 MG tablet Take 20 mg by mouth 2 (two) times daily.     glipiZIDE (GLUCOTROL) 5 MG tablet Take 5 mg by mouth 2 (two) times daily before a meal.  (Patient taking differently: Take 5 mg by mouth 2 (two) times daily before a meal. 1 Tablet in the AM and 1 1/2 in the PM)     Metoprolol Succinate 50 MG CS24 Take 50 mg by mouth daily.      Potassium Chloride ER 20 MEQ TBCR Take 20 mEq by mouth 2 (two) times daily.     Turmeric 500 MG TABS Take 1,000 mg by mouth daily.     No current facility-administered medications for this visit.   Allergies:  Augmentin [amoxicillin-pot clavulanate] and Codeine   Social History: The patient  reports that she has never smoked. She has never used smokeless tobacco. She reports current alcohol use. She  reports that she does not use drugs.   Family History: The patient's family history includes Diabetes in her mother; Heart failure in her father and mother.   ROS:  Please see the history of present illness. Otherwise, complete review of systems is positive for none  All other systems are reviewed and negative.   Physical Exam: VS:  Ht 5' 4 (1.626 m)   Wt 179 lb 6.4 oz (81.4 kg)   BMI 30.79 kg/m , BMI Body mass index is 30.79 kg/m.  Wt Readings from Last 3 Encounters:  01/10/24 179 lb 6.4 oz (81.4 kg)  03/01/23 172 lb (78 kg)  03/28/22 180 lb (81.6 kg)    General: Patient appears comfortable at rest. HEENT: Conjunctiva and lids normal, oropharynx clear with moist mucosa. Neck: Supple, no elevated JVP or carotid bruits, no thyromegaly. Lungs: Clear to auscultation,  nonlabored breathing at rest. Cardiac: Regular rate and rhythm, ejection systolic murmur with absent S2 Abdomen: Soft, nontender, no hepatomegaly, bowel sounds present, no guarding or rebound. Extremities: No pitting edema, distal pulses 2+. Skin: Warm and dry. Musculoskeletal: No kyphosis. Neuropsychiatric: Alert and oriented x3, affect grossly appropriate.  Recent Labwork: No results found for requested labs within last 365 days.  No results found for: CHOL, TRIG, HDL, CHOLHDL, VLDL, LDLCALC, LDLDIRECT   Assessment and Plan:  Moderate aortic valve stenosis - Asymptomatic. - Echocardiogram from May 2025 performed PCPs office showed normal LVEF, G2 DD and moderate aortic valve stenosis. - However physical exam findings consistent with severe aortic valve stenosis.  Due to G2 DD on echocardiogram, will obtain BNP.  If BNP is elevated, will obtain repeat echocardiogram ASAP.  Otherwise, will repeat echocardiogram in December 2025. - ER precautions for severe AS provided.  HTN, controlled - Continue amlodipine 10 mg once daily, metoprolol succinate 50 mg once daily and p.o. Lasix  20 mg twice  daily.  HLD, unknown values - Continue atorvastatin 20 mg nightly.  I spent 40 minutes in reviewing the prior records, reports, test, labs, discussed the pathophysiology and symptoms of severe atrial stenosis, discussed HTN with the patient, documentation.  I answered all her questions.     Medication Adjustments/Labs and Tests Ordered: Current medicines are reviewed at length with the patient today.  Concerns regarding medicines are outlined above.    Disposition:  Follow up 6 months  Signed Ranen Doolin Priya Meloni Hinz, MD, 01/10/2024 3:52 PM    [facility] at [facility] [address] [place], [place], [place] [address]

## 2024-01-11 ENCOUNTER — Other Ambulatory Visit (HOSPITAL_COMMUNITY)
Admission: RE | Admit: 2024-01-11 | Discharge: 2024-01-11 | Disposition: A | Discharge status: Home / Self Care | Source: Ambulatory Visit | Attending: Internal Medicine | Admitting: Internal Medicine

## 2024-01-11 DIAGNOSIS — S36892A Contusion of other intra-abdominal organs, initial encounter: Secondary | ICD-10-CM | POA: Diagnosis not present

## 2024-01-11 DIAGNOSIS — I350 Nonrheumatic aortic (valve) stenosis: Secondary | ICD-10-CM | POA: Diagnosis not present

## 2024-01-11 DIAGNOSIS — M1712 Unilateral primary osteoarthritis, left knee: Secondary | ICD-10-CM | POA: Diagnosis not present

## 2024-01-11 DIAGNOSIS — Z7984 Long term (current) use of oral hypoglycemic drugs: Secondary | ICD-10-CM | POA: Diagnosis not present

## 2024-01-11 DIAGNOSIS — S72401D Unspecified fracture of lower end of right femur, subsequent encounter for closed fracture with routine healing: Secondary | ICD-10-CM | POA: Diagnosis not present

## 2024-01-11 DIAGNOSIS — M85842 Other specified disorders of bone density and structure, left hand: Secondary | ICD-10-CM | POA: Diagnosis not present

## 2024-01-11 DIAGNOSIS — M79661 Pain in right lower leg: Secondary | ICD-10-CM | POA: Diagnosis not present

## 2024-01-11 DIAGNOSIS — K219 Gastro-esophageal reflux disease without esophagitis: Secondary | ICD-10-CM | POA: Diagnosis not present

## 2024-01-11 DIAGNOSIS — E785 Hyperlipidemia, unspecified: Secondary | ICD-10-CM | POA: Diagnosis not present

## 2024-01-11 DIAGNOSIS — E1122 Type 2 diabetes mellitus with diabetic chronic kidney disease: Secondary | ICD-10-CM | POA: Diagnosis not present

## 2024-01-11 DIAGNOSIS — N189 Chronic kidney disease, unspecified: Secondary | ICD-10-CM | POA: Diagnosis not present

## 2024-01-11 DIAGNOSIS — Z880 Allergy status to penicillin: Secondary | ICD-10-CM | POA: Diagnosis not present

## 2024-01-11 DIAGNOSIS — M85862 Other specified disorders of bone density and structure, left lower leg: Secondary | ICD-10-CM | POA: Diagnosis not present

## 2024-01-11 DIAGNOSIS — K8020 Calculus of gallbladder without cholecystitis without obstruction: Secondary | ICD-10-CM | POA: Diagnosis not present

## 2024-01-11 DIAGNOSIS — S82044A Nondisplaced comminuted fracture of right patella, initial encounter for closed fracture: Secondary | ICD-10-CM | POA: Diagnosis not present

## 2024-01-11 DIAGNOSIS — S82041A Displaced comminuted fracture of right patella, initial encounter for closed fracture: Secondary | ICD-10-CM | POA: Diagnosis not present

## 2024-01-11 DIAGNOSIS — I129 Hypertensive chronic kidney disease with stage 1 through stage 4 chronic kidney disease, or unspecified chronic kidney disease: Secondary | ICD-10-CM | POA: Diagnosis not present

## 2024-01-11 DIAGNOSIS — Z87442 Personal history of urinary calculi: Secondary | ICD-10-CM | POA: Diagnosis not present

## 2024-01-11 DIAGNOSIS — M79642 Pain in left hand: Secondary | ICD-10-CM | POA: Diagnosis not present

## 2024-01-11 DIAGNOSIS — Z885 Allergy status to narcotic agent status: Secondary | ICD-10-CM | POA: Diagnosis not present

## 2024-01-11 DIAGNOSIS — N200 Calculus of kidney: Secondary | ICD-10-CM | POA: Diagnosis not present

## 2024-01-11 DIAGNOSIS — M25562 Pain in left knee: Secondary | ICD-10-CM | POA: Diagnosis not present

## 2024-01-11 DIAGNOSIS — S2241XA Multiple fractures of ribs, right side, initial encounter for closed fracture: Secondary | ICD-10-CM | POA: Diagnosis not present

## 2024-01-11 DIAGNOSIS — Z79899 Other long term (current) drug therapy: Secondary | ICD-10-CM | POA: Insufficient documentation

## 2024-01-11 DIAGNOSIS — S37011A Minor contusion of right kidney, initial encounter: Secondary | ICD-10-CM | POA: Diagnosis not present

## 2024-01-11 DIAGNOSIS — M19042 Primary osteoarthritis, left hand: Secondary | ICD-10-CM | POA: Diagnosis not present

## 2024-01-11 DIAGNOSIS — M85861 Other specified disorders of bone density and structure, right lower leg: Secondary | ICD-10-CM | POA: Diagnosis not present

## 2024-01-11 DIAGNOSIS — M1711 Unilateral primary osteoarthritis, right knee: Secondary | ICD-10-CM | POA: Diagnosis not present

## 2024-01-11 DIAGNOSIS — Z66 Do not resuscitate: Secondary | ICD-10-CM | POA: Diagnosis not present

## 2024-01-11 DIAGNOSIS — S8991XA Unspecified injury of right lower leg, initial encounter: Secondary | ICD-10-CM | POA: Diagnosis not present

## 2024-01-11 DIAGNOSIS — Z833 Family history of diabetes mellitus: Secondary | ICD-10-CM | POA: Diagnosis not present

## 2024-01-11 DIAGNOSIS — D62 Acute posthemorrhagic anemia: Secondary | ICD-10-CM | POA: Diagnosis not present

## 2024-01-11 DIAGNOSIS — Z8249 Family history of ischemic heart disease and other diseases of the circulatory system: Secondary | ICD-10-CM | POA: Diagnosis not present

## 2024-01-11 DIAGNOSIS — N1832 Chronic kidney disease, stage 3b: Secondary | ICD-10-CM | POA: Diagnosis not present

## 2024-01-11 DIAGNOSIS — S301XXA Contusion of abdominal wall, initial encounter: Secondary | ICD-10-CM | POA: Diagnosis not present

## 2024-01-11 DIAGNOSIS — J9811 Atelectasis: Secondary | ICD-10-CM | POA: Diagnosis not present

## 2024-01-11 DIAGNOSIS — M159 Polyosteoarthritis, unspecified: Secondary | ICD-10-CM | POA: Diagnosis not present

## 2024-01-11 LAB — BRAIN NATRIURETIC PEPTIDE: B Natriuretic Peptide: 69 pg/mL (ref 0.0–100.0)

## 2024-01-13 ENCOUNTER — Emergency Department (HOSPITAL_COMMUNITY)

## 2024-01-13 ENCOUNTER — Inpatient Hospital Stay (HOSPITAL_COMMUNITY)
Admission: EM | Admit: 2024-01-13 | Discharge: 2024-01-19 | DRG: 964 | Disposition: A | Discharge status: Home / Self Care | Attending: Surgery | Admitting: Surgery

## 2024-01-13 ENCOUNTER — Other Ambulatory Visit: Payer: Self-pay

## 2024-01-13 ENCOUNTER — Encounter (HOSPITAL_COMMUNITY): Payer: Self-pay

## 2024-01-13 ENCOUNTER — Ambulatory Visit: Payer: Self-pay | Admitting: Internal Medicine

## 2024-01-13 DIAGNOSIS — S37091A Other injury of right kidney, initial encounter: Secondary | ICD-10-CM | POA: Diagnosis not present

## 2024-01-13 DIAGNOSIS — S2241XA Multiple fractures of ribs, right side, initial encounter for closed fracture: Secondary | ICD-10-CM | POA: Diagnosis not present

## 2024-01-13 DIAGNOSIS — T07XXXA Unspecified multiple injuries, initial encounter: Secondary | ICD-10-CM | POA: Diagnosis present

## 2024-01-13 DIAGNOSIS — Z7984 Long term (current) use of oral hypoglycemic drugs: Secondary | ICD-10-CM | POA: Diagnosis not present

## 2024-01-13 DIAGNOSIS — M1711 Unilateral primary osteoarthritis, right knee: Secondary | ICD-10-CM | POA: Diagnosis not present

## 2024-01-13 DIAGNOSIS — E785 Hyperlipidemia, unspecified: Secondary | ICD-10-CM | POA: Diagnosis present

## 2024-01-13 DIAGNOSIS — I129 Hypertensive chronic kidney disease with stage 1 through stage 4 chronic kidney disease, or unspecified chronic kidney disease: Secondary | ICD-10-CM | POA: Diagnosis not present

## 2024-01-13 DIAGNOSIS — M19042 Primary osteoarthritis, left hand: Secondary | ICD-10-CM | POA: Diagnosis not present

## 2024-01-13 DIAGNOSIS — S37011A Minor contusion of right kidney, initial encounter: Secondary | ICD-10-CM | POA: Diagnosis present

## 2024-01-13 DIAGNOSIS — J9811 Atelectasis: Secondary | ICD-10-CM | POA: Diagnosis not present

## 2024-01-13 DIAGNOSIS — Z885 Allergy status to narcotic agent status: Secondary | ICD-10-CM

## 2024-01-13 DIAGNOSIS — S36892A Contusion of other intra-abdominal organs, initial encounter: Secondary | ICD-10-CM | POA: Diagnosis present

## 2024-01-13 DIAGNOSIS — M85861 Other specified disorders of bone density and structure, right lower leg: Secondary | ICD-10-CM | POA: Diagnosis not present

## 2024-01-13 DIAGNOSIS — M1712 Unilateral primary osteoarthritis, left knee: Secondary | ICD-10-CM | POA: Diagnosis not present

## 2024-01-13 DIAGNOSIS — Z79899 Other long term (current) drug therapy: Secondary | ICD-10-CM

## 2024-01-13 DIAGNOSIS — Y92410 Unspecified street and highway as the place of occurrence of the external cause: Secondary | ICD-10-CM | POA: Diagnosis not present

## 2024-01-13 DIAGNOSIS — M79661 Pain in right lower leg: Secondary | ICD-10-CM | POA: Diagnosis not present

## 2024-01-13 DIAGNOSIS — S82041A Displaced comminuted fracture of right patella, initial encounter for closed fracture: Secondary | ICD-10-CM | POA: Diagnosis present

## 2024-01-13 DIAGNOSIS — S8991XA Unspecified injury of right lower leg, initial encounter: Secondary | ICD-10-CM | POA: Diagnosis not present

## 2024-01-13 DIAGNOSIS — Z66 Do not resuscitate: Secondary | ICD-10-CM | POA: Diagnosis present

## 2024-01-13 DIAGNOSIS — D62 Acute posthemorrhagic anemia: Secondary | ICD-10-CM | POA: Diagnosis not present

## 2024-01-13 DIAGNOSIS — Z833 Family history of diabetes mellitus: Secondary | ICD-10-CM

## 2024-01-13 DIAGNOSIS — M85842 Other specified disorders of bone density and structure, left hand: Secondary | ICD-10-CM | POA: Diagnosis not present

## 2024-01-13 DIAGNOSIS — Z880 Allergy status to penicillin: Secondary | ICD-10-CM

## 2024-01-13 DIAGNOSIS — E1122 Type 2 diabetes mellitus with diabetic chronic kidney disease: Secondary | ICD-10-CM | POA: Diagnosis present

## 2024-01-13 DIAGNOSIS — M85862 Other specified disorders of bone density and structure, left lower leg: Secondary | ICD-10-CM | POA: Diagnosis not present

## 2024-01-13 DIAGNOSIS — Z90710 Acquired absence of both cervix and uterus: Secondary | ICD-10-CM | POA: Diagnosis not present

## 2024-01-13 DIAGNOSIS — S82044A Nondisplaced comminuted fracture of right patella, initial encounter for closed fracture: Secondary | ICD-10-CM

## 2024-01-13 DIAGNOSIS — K219 Gastro-esophageal reflux disease without esophagitis: Secondary | ICD-10-CM | POA: Diagnosis present

## 2024-01-13 DIAGNOSIS — Z23 Encounter for immunization: Secondary | ICD-10-CM | POA: Diagnosis not present

## 2024-01-13 DIAGNOSIS — M159 Polyosteoarthritis, unspecified: Secondary | ICD-10-CM | POA: Diagnosis present

## 2024-01-13 DIAGNOSIS — F419 Anxiety disorder, unspecified: Secondary | ICD-10-CM | POA: Diagnosis present

## 2024-01-13 DIAGNOSIS — K8020 Calculus of gallbladder without cholecystitis without obstruction: Secondary | ICD-10-CM | POA: Diagnosis not present

## 2024-01-13 DIAGNOSIS — N200 Calculus of kidney: Secondary | ICD-10-CM | POA: Diagnosis not present

## 2024-01-13 DIAGNOSIS — K683 Retroperitoneal hematoma: Secondary | ICD-10-CM

## 2024-01-13 DIAGNOSIS — S72401D Unspecified fracture of lower end of right femur, subsequent encounter for closed fracture with routine healing: Secondary | ICD-10-CM | POA: Diagnosis not present

## 2024-01-13 DIAGNOSIS — I350 Nonrheumatic aortic (valve) stenosis: Secondary | ICD-10-CM | POA: Diagnosis present

## 2024-01-13 DIAGNOSIS — E119 Type 2 diabetes mellitus without complications: Secondary | ICD-10-CM | POA: Diagnosis not present

## 2024-01-13 DIAGNOSIS — N1832 Chronic kidney disease, stage 3b: Secondary | ICD-10-CM | POA: Diagnosis present

## 2024-01-13 DIAGNOSIS — R609 Edema, unspecified: Secondary | ICD-10-CM | POA: Diagnosis not present

## 2024-01-13 DIAGNOSIS — N189 Chronic kidney disease, unspecified: Secondary | ICD-10-CM | POA: Diagnosis not present

## 2024-01-13 DIAGNOSIS — M79642 Pain in left hand: Secondary | ICD-10-CM | POA: Diagnosis not present

## 2024-01-13 DIAGNOSIS — S2249XA Multiple fractures of ribs, unspecified side, initial encounter for closed fracture: Secondary | ICD-10-CM | POA: Diagnosis present

## 2024-01-13 DIAGNOSIS — S301XXA Contusion of abdominal wall, initial encounter: Secondary | ICD-10-CM | POA: Diagnosis not present

## 2024-01-13 DIAGNOSIS — Z8249 Family history of ischemic heart disease and other diseases of the circulatory system: Secondary | ICD-10-CM

## 2024-01-13 DIAGNOSIS — M25562 Pain in left knee: Secondary | ICD-10-CM | POA: Diagnosis not present

## 2024-01-13 DIAGNOSIS — R58 Hemorrhage, not elsewhere classified: Secondary | ICD-10-CM | POA: Diagnosis not present

## 2024-01-13 DIAGNOSIS — Z87442 Personal history of urinary calculi: Secondary | ICD-10-CM | POA: Diagnosis not present

## 2024-01-13 DIAGNOSIS — S82091A Other fracture of right patella, initial encounter for closed fracture: Secondary | ICD-10-CM | POA: Diagnosis not present

## 2024-01-13 LAB — CBC WITH DIFFERENTIAL/PLATELET
Abs Immature Granulocytes: 0.14 K/uL — ABNORMAL HIGH (ref 0.00–0.07)
Basophils Absolute: 0.1 K/uL (ref 0.0–0.1)
Basophils Relative: 0 %
Eosinophils Absolute: 0.2 K/uL (ref 0.0–0.5)
Eosinophils Relative: 1 %
HCT: 31.9 % — ABNORMAL LOW (ref 36.0–46.0)
Hemoglobin: 9.9 g/dL — ABNORMAL LOW (ref 12.0–15.0)
Immature Granulocytes: 1 %
Lymphocytes Relative: 14 %
Lymphs Abs: 2.5 K/uL (ref 0.7–4.0)
MCH: 28.2 pg (ref 26.0–34.0)
MCHC: 31 g/dL (ref 30.0–36.0)
MCV: 90.9 fL (ref 80.0–100.0)
Monocytes Absolute: 0.9 K/uL (ref 0.1–1.0)
Monocytes Relative: 5 %
Neutro Abs: 14.2 K/uL — ABNORMAL HIGH (ref 1.7–7.7)
Neutrophils Relative %: 79 %
Platelets: 222 K/uL (ref 150–400)
RBC: 3.51 MIL/uL — ABNORMAL LOW (ref 3.87–5.11)
RDW: 15.1 % (ref 11.5–15.5)
WBC: 18 K/uL — ABNORMAL HIGH (ref 4.0–10.5)
nRBC: 0 % (ref 0.0–0.2)

## 2024-01-13 LAB — COMPREHENSIVE METABOLIC PANEL WITH GFR
ALT: 25 U/L (ref 0–44)
AST: 29 U/L (ref 15–41)
Albumin: 3.5 g/dL (ref 3.5–5.0)
Alkaline Phosphatase: 92 U/L (ref 38–126)
Anion gap: 11 (ref 5–15)
BUN: 32 mg/dL — ABNORMAL HIGH (ref 8–23)
CO2: 20 mmol/L — ABNORMAL LOW (ref 22–32)
Calcium: 8.7 mg/dL — ABNORMAL LOW (ref 8.9–10.3)
Chloride: 110 mmol/L (ref 98–111)
Creatinine, Ser: 2.06 mg/dL — ABNORMAL HIGH (ref 0.44–1.00)
GFR, Estimated: 24 mL/min — ABNORMAL LOW (ref >60)
Glucose, Bld: 236 mg/dL — ABNORMAL HIGH (ref 70–99)
Potassium: 3.9 mmol/L (ref 3.5–5.1)
Sodium: 141 mmol/L (ref 135–145)
Total Bilirubin: 0.6 mg/dL (ref 0.0–1.2)
Total Protein: 6.4 g/dL — ABNORMAL LOW (ref 6.5–8.1)

## 2024-01-13 LAB — TROPONIN I (HIGH SENSITIVITY)
Troponin I (High Sensitivity): 5 ng/L (ref <18)
Troponin I (High Sensitivity): 5 ng/L (ref <18)

## 2024-01-13 MED ORDER — MORPHINE SULFATE (PF) 4 MG/ML IV SOLN
2.0000 mg | Freq: Once | INTRAVENOUS | Status: AC
  Administered 2024-01-13: 2 mg via INTRAVENOUS
  Filled 2024-01-13: qty 1

## 2024-01-13 MED ORDER — HYDROCODONE-ACETAMINOPHEN 5-325 MG PO TABS
1.0000 | ORAL_TABLET | Freq: Once | ORAL | Status: AC
  Administered 2024-01-13: 1 via ORAL
  Filled 2024-01-13: qty 1

## 2024-01-13 MED ORDER — FENTANYL CITRATE (PF) 100 MCG/2ML IJ SOLN
25.0000 ug | Freq: Once | INTRAMUSCULAR | Status: AC
  Administered 2024-01-13: 25 ug via INTRAVENOUS
  Filled 2024-01-13: qty 2

## 2024-01-13 MED ORDER — DOUBLE ANTIBIOTIC 500-10000 UNIT/GM EX OINT
TOPICAL_OINTMENT | Freq: Once | CUTANEOUS | Status: AC
  Filled 2024-01-13: qty 1

## 2024-01-13 MED ORDER — ONDANSETRON HCL 4 MG/2ML IJ SOLN
4.0000 mg | Freq: Once | INTRAMUSCULAR | Status: AC
  Administered 2024-01-13: 4 mg via INTRAVENOUS
  Filled 2024-01-13: qty 2

## 2024-01-13 MED ORDER — TETANUS-DIPHTH-ACELL PERTUSSIS 5-2.5-18.5 LF-MCG/0.5 IM SUSY
0.5000 mL | PREFILLED_SYRINGE | Freq: Once | INTRAMUSCULAR | Status: AC
  Administered 2024-01-13: 0.5 mL via INTRAMUSCULAR
  Filled 2024-01-13: qty 0.5
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
## 2024-01-14 DIAGNOSIS — T07XXXA Unspecified multiple injuries, initial encounter: Secondary | ICD-10-CM | POA: Diagnosis present

## 2024-01-14 LAB — URINALYSIS, ROUTINE W REFLEX MICROSCOPIC
Bilirubin Urine: NEGATIVE
Glucose, UA: NEGATIVE mg/dL
Hgb urine dipstick: NEGATIVE
Ketones, ur: NEGATIVE mg/dL
Nitrite: NEGATIVE
Protein, ur: NEGATIVE mg/dL
Specific Gravity, Urine: 1.014 (ref 1.005–1.030)
pH: 5 (ref 5.0–8.0)

## 2024-01-14 LAB — CBC WITH DIFFERENTIAL/PLATELET
Abs Immature Granulocytes: 0.09 K/uL — ABNORMAL HIGH (ref 0.00–0.07)
Basophils Absolute: 0 K/uL (ref 0.0–0.1)
Basophils Relative: 0 %
Eosinophils Absolute: 0 K/uL (ref 0.0–0.5)
Eosinophils Relative: 0 %
HCT: 28.8 % — ABNORMAL LOW (ref 36.0–46.0)
Hemoglobin: 9 g/dL — ABNORMAL LOW (ref 12.0–15.0)
Immature Granulocytes: 1 %
Lymphocytes Relative: 10 %
Lymphs Abs: 1.4 K/uL (ref 0.7–4.0)
MCH: 28.4 pg (ref 26.0–34.0)
MCHC: 31.3 g/dL (ref 30.0–36.0)
MCV: 90.9 fL (ref 80.0–100.0)
Monocytes Absolute: 0.8 K/uL (ref 0.1–1.0)
Monocytes Relative: 6 %
Neutro Abs: 12.3 K/uL — ABNORMAL HIGH (ref 1.7–7.7)
Neutrophils Relative %: 83 %
Platelets: 193 K/uL (ref 150–400)
RBC: 3.17 MIL/uL — ABNORMAL LOW (ref 3.87–5.11)
RDW: 15.1 % (ref 11.5–15.5)
WBC: 14.7 K/uL — ABNORMAL HIGH (ref 4.0–10.5)
nRBC: 0 % (ref 0.0–0.2)

## 2024-01-14 LAB — TYPE AND SCREEN
ABO/RH(D): O POS
Antibody Screen: NEGATIVE

## 2024-01-14 LAB — MRSA NEXT GEN BY PCR, NASAL: MRSA by PCR Next Gen: NOT DETECTED

## 2024-01-14 LAB — BASIC METABOLIC PANEL WITH GFR
Anion gap: 10 (ref 5–15)
BUN: 32 mg/dL — ABNORMAL HIGH (ref 8–23)
CO2: 22 mmol/L (ref 22–32)
Calcium: 8.9 mg/dL (ref 8.9–10.3)
Chloride: 109 mmol/L (ref 98–111)
Creatinine, Ser: 2.35 mg/dL — ABNORMAL HIGH (ref 0.44–1.00)
GFR, Estimated: 21 mL/min — ABNORMAL LOW (ref >60)
Glucose, Bld: 187 mg/dL — ABNORMAL HIGH (ref 70–99)
Potassium: 4.4 mmol/L (ref 3.5–5.1)
Sodium: 141 mmol/L (ref 135–145)

## 2024-01-14 LAB — GLUCOSE, CAPILLARY
Glucose-Capillary: 190 mg/dL — ABNORMAL HIGH (ref 70–99)
Glucose-Capillary: 184 mg/dL — ABNORMAL HIGH (ref 70–99)
Glucose-Capillary: 233 mg/dL — ABNORMAL HIGH (ref 70–99)
Glucose-Capillary: 157 mg/dL — ABNORMAL HIGH (ref 70–99)
Glucose-Capillary: 127 mg/dL — ABNORMAL HIGH (ref 70–99)
Glucose-Capillary: 132 mg/dL — ABNORMAL HIGH (ref 70–99)

## 2024-01-14 LAB — CBC
HCT: 28.8 % — ABNORMAL LOW (ref 36.0–46.0)
Hemoglobin: 9 g/dL — ABNORMAL LOW (ref 12.0–15.0)
MCH: 28 pg (ref 26.0–34.0)
MCHC: 31.3 g/dL (ref 30.0–36.0)
MCV: 89.4 fL (ref 80.0–100.0)
Platelets: 194 K/uL (ref 150–400)
RBC: 3.22 MIL/uL — ABNORMAL LOW (ref 3.87–5.11)
RDW: 15.3 % (ref 11.5–15.5)
WBC: 13.9 K/uL — ABNORMAL HIGH (ref 4.0–10.5)
nRBC: 0 % (ref 0.0–0.2)

## 2024-01-14 LAB — HEMOGLOBIN A1C
Hgb A1c MFr Bld: 7.1 % — ABNORMAL HIGH (ref 4.8–5.6)
Mean Plasma Glucose: 157.07 mg/dL

## 2024-01-14 MED ORDER — ACETAMINOPHEN 500 MG PO TABS
1000.0000 mg | ORAL_TABLET | Freq: Three times a day (TID) | ORAL | Status: DC
  Administered 2024-01-14 – 2024-01-16 (×7): 1000 mg via ORAL
  Filled 2024-01-14 (×7): qty 2

## 2024-01-14 MED ORDER — ONDANSETRON HCL 4 MG/2ML IJ SOLN: 4.0000 mg | Freq: Four times a day (QID) | INTRAMUSCULAR | Status: DC | PRN

## 2024-01-14 MED ORDER — METHOCARBAMOL 500 MG PO TABS
500.0000 mg | ORAL_TABLET | Freq: Four times a day (QID) | ORAL | Status: DC
  Administered 2024-01-14 – 2024-01-16 (×8): 500 mg via ORAL
  Filled 2024-01-14 (×8): qty 1

## 2024-01-14 MED ORDER — OXYCODONE HCL 5 MG PO TABS
2.5000 mg | ORAL_TABLET | ORAL | Status: DC | PRN
  Administered 2024-01-14 (×2): 5 mg via ORAL
  Administered 2024-01-14: 2.5 mg via ORAL
  Administered 2024-01-14 – 2024-01-15 (×2): 5 mg via ORAL
  Administered 2024-01-16: 2.5 mg via ORAL
  Administered 2024-01-16 – 2024-01-17 (×4): 5 mg via ORAL
  Filled 2024-01-14 (×10): qty 1

## 2024-01-14 MED ORDER — POLYETHYLENE GLYCOL 3350 17 G PO PACK: 17.0000 g | PACK | Freq: Every day | ORAL | Status: DC | PRN

## 2024-01-14 MED ORDER — MORPHINE SULFATE (PF) 2 MG/ML IV SOLN
1.0000 mg | INTRAVENOUS | Status: DC | PRN
  Administered 2024-01-14 – 2024-01-15 (×2): 1 mg via INTRAVENOUS
  Administered 2024-01-16 (×3): 2 mg via INTRAVENOUS
  Filled 2024-01-14 (×6): qty 1

## 2024-01-14 MED ORDER — ATORVASTATIN CALCIUM 10 MG PO TABS
20.0000 mg | ORAL_TABLET | Freq: Every day | ORAL | Status: DC
  Administered 2024-01-14 – 2024-01-18 (×5): 20 mg via ORAL
  Filled 2024-01-14 (×5): qty 2

## 2024-01-14 MED ORDER — ORAL CARE MOUTH RINSE: 15.0000 mL | OROMUCOSAL | Status: DC | PRN

## 2024-01-14 MED ORDER — CHLORHEXIDINE GLUCONATE CLOTH 2 % EX PADS
6.0000 | MEDICATED_PAD | Freq: Every day | CUTANEOUS | Status: DC
  Administered 2024-01-14 – 2024-01-19 (×6): 6 via TOPICAL

## 2024-01-14 MED ORDER — ACETAMINOPHEN 650 MG RE SUPP: 975.0000 mg | Freq: Three times a day (TID) | RECTAL | Status: DC

## 2024-01-14 MED ORDER — ONDANSETRON 4 MG PO TBDP
4.0000 mg | ORAL_TABLET | Freq: Four times a day (QID) | ORAL | Status: DC | PRN
Start: 2024-01-14 — End: 2024-01-19

## 2024-01-14 MED ORDER — HYDRALAZINE HCL 20 MG/ML IJ SOLN: 10.0000 mg | INTRAMUSCULAR | Status: DC | PRN

## 2024-01-14 MED ORDER — GLIPIZIDE 5 MG PO TABS
5.0000 mg | ORAL_TABLET | Freq: Two times a day (BID) | ORAL | Status: DC
  Administered 2024-01-14: 5 mg via ORAL
  Filled 2024-01-14 (×2): qty 1

## 2024-01-14 MED ORDER — INSULIN ASPART 100 UNIT/ML IJ SOLN
0.0000 [IU] | Freq: Three times a day (TID) | INTRAMUSCULAR | Status: DC
  Administered 2024-01-14: 2 [IU] via SUBCUTANEOUS
  Administered 2024-01-14: 3 [IU] via SUBCUTANEOUS
  Administered 2024-01-15 (×2): 2 [IU] via SUBCUTANEOUS
  Administered 2024-01-15: 3 [IU] via SUBCUTANEOUS
  Administered 2024-01-16: 2 [IU] via SUBCUTANEOUS
  Administered 2024-01-16: 3 [IU] via SUBCUTANEOUS
  Administered 2024-01-16 – 2024-01-17 (×2): 2 [IU] via SUBCUTANEOUS
  Administered 2024-01-17: 3 [IU] via SUBCUTANEOUS
  Administered 2024-01-17: 2 [IU] via SUBCUTANEOUS
  Administered 2024-01-18: 3 [IU] via SUBCUTANEOUS
  Administered 2024-01-18 (×2): 2 [IU] via SUBCUTANEOUS
  Administered 2024-01-19: 5 [IU] via SUBCUTANEOUS
  Administered 2024-01-19: 2 [IU] via SUBCUTANEOUS

## 2024-01-14 MED ORDER — SODIUM CHLORIDE 0.9 % IV SOLN: INTRAVENOUS | Status: AC

## 2024-01-14 MED ORDER — DOCUSATE SODIUM 100 MG PO CAPS
100.0000 mg | ORAL_CAPSULE | Freq: Two times a day (BID) | ORAL | Status: DC
  Administered 2024-01-14 – 2024-01-19 (×12): 100 mg via ORAL
  Filled 2024-01-14 (×12): qty 1

## 2024-01-14 MED ORDER — METOPROLOL TARTRATE 5 MG/5ML IV SOLN: 5.0000 mg | Freq: Four times a day (QID) | INTRAVENOUS | Status: DC | PRN

## 2024-01-14 MED ORDER — METOPROLOL SUCCINATE ER 50 MG PO TB24
50.0000 mg | ORAL_TABLET | Freq: Every day | ORAL | Status: DC
  Administered 2024-01-14 – 2024-01-19 (×6): 50 mg via ORAL
  Filled 2024-01-14 (×6): qty 1

## 2024-01-14 MED ORDER — GUAIFENESIN ER 600 MG PO TB12: 600.0000 mg | ORAL_TABLET | Freq: Two times a day (BID) | ORAL | Status: DC | PRN

## 2024-01-14 MED ORDER — ACETAMINOPHEN 160 MG/5ML PO SOLN: 1000.0000 mg | Freq: Three times a day (TID) | ORAL | Status: DC
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
## 2024-01-14 NOTE — Progress Notes (Addendum)
 Just admitted a few hours ago.  Patient with no new complaints.  Ortho, Dr. Fidel was notified for patella fracture.  He will address.  UA negative for blood so suspect this was more of a ruptured cyst.  Q 6 H/H x 24 hrs, but so far hgb stable.  Will allow solid diet as she has no abdominal pain, N/V.  Patient otherwise stable.  Also patient desires DNR status with no compressions, but is ok with intubation, medications, and all other intervention.  Burnard FORBES Banter, PA-C 8:09 AM 01/14/2024

## 2024-01-14 NOTE — Plan of Care (Signed)
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
## 2024-01-14 NOTE — Progress Notes (Signed)
 Orthopedic Tech Progress Note Patient Details:  Beth Malone Dec 27, 1945 [id number]  Gentle compressive dressing of cast padding and ace wrap applied from R foot to thigh, followed by the knee immobilizer.  Ortho Devices Type of Ortho Device: Knee Immobilizer, Ace wrap, Cotton web roll Ortho Device/Splint Location: RLE Ortho Device/Splint Interventions: Ordered, Application, Adjustment   Post Interventions Patient Tolerated: Fair Instructions Provided: Care of device, Adjustment of device  Beth Malone 01/14/2024, 3:49 PM

## 2024-01-15 LAB — BASIC METABOLIC PANEL WITH GFR
Anion gap: 6 (ref 5–15)
BUN: 32 mg/dL — ABNORMAL HIGH (ref 8–23)
CO2: 23 mmol/L (ref 22–32)
Calcium: 8.3 mg/dL — ABNORMAL LOW (ref 8.9–10.3)
Chloride: 108 mmol/L (ref 98–111)
Creatinine, Ser: 2.18 mg/dL — ABNORMAL HIGH (ref 0.44–1.00)
GFR, Estimated: 23 mL/min — ABNORMAL LOW (ref >60)
Glucose, Bld: 214 mg/dL — ABNORMAL HIGH (ref 70–99)
Potassium: 3.8 mmol/L (ref 3.5–5.1)
Sodium: 137 mmol/L (ref 135–145)

## 2024-01-15 LAB — GLUCOSE, CAPILLARY
Glucose-Capillary: 121 mg/dL — ABNORMAL HIGH (ref 70–99)
Glucose-Capillary: 189 mg/dL — ABNORMAL HIGH (ref 70–99)
Glucose-Capillary: 121 mg/dL — ABNORMAL HIGH (ref 70–99)
Glucose-Capillary: 146 mg/dL — ABNORMAL HIGH (ref 70–99)

## 2024-01-15 LAB — CBC
HCT: 22 % — ABNORMAL LOW (ref 36.0–46.0)
Hemoglobin: 6.7 g/dL — CL (ref 12.0–15.0)
MCH: 27.7 pg (ref 26.0–34.0)
MCHC: 30.5 g/dL (ref 30.0–36.0)
MCV: 90.9 fL (ref 80.0–100.0)
Platelets: 138 K/uL — ABNORMAL LOW (ref 150–400)
RBC: 2.42 MIL/uL — ABNORMAL LOW (ref 3.87–5.11)
RDW: 15 % (ref 11.5–15.5)
WBC: 11.7 K/uL — ABNORMAL HIGH (ref 4.0–10.5)
nRBC: 0 % (ref 0.0–0.2)

## 2024-01-15 LAB — PREPARE RBC (CROSSMATCH)

## 2024-01-15 LAB — HEMOGLOBIN AND HEMATOCRIT, BLOOD
HCT: 27.7 % — ABNORMAL LOW (ref 36.0–46.0)
Hemoglobin: 8.6 g/dL — ABNORMAL LOW (ref 12.0–15.0)

## 2024-01-15 MED ORDER — FUROSEMIDE 10 MG/ML IJ SOLN
20.0000 mg | Freq: Once | INTRAMUSCULAR | Status: AC
  Administered 2024-01-15: 20 mg via INTRAVENOUS
  Filled 2024-01-15: qty 2

## 2024-01-15 MED ORDER — SODIUM CHLORIDE 0.9% IV SOLUTION: Freq: Once | INTRAVENOUS | Status: AC
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
## 2024-01-15 NOTE — Progress Notes (Signed)
 Transition of Care [facility]) - CAGE-AID Screening   Patient Details  Name: Beth Malone MRN: [id number] Date of Birth: [birth year]  [facility] CM/SW Contact:    Bernardino Mayotte, RN Phone Number: 01/15/2024, 7:31 PM   Clinical Narrative:  Patient endorses some alcohol use, denies illicit drug use. Resources not given at this time.  CAGE-AID Screening:    Have You Ever Felt You Ought to Cut Down on Your Drinking or Drug Use?: No Have People Annoyed You By Critizing Your Drinking Or Drug Use?: No Have You Felt Bad Or Guilty About Your Drinking Or Drug Use?: No Have You Ever Had a Drink or Used Drugs First Thing In The Morning to Steady Your Nerves or to Get Rid of a Hangover?: No CAGE-AID Score: 0  Substance Abuse Education Offered: No

## 2024-01-15 NOTE — Progress Notes (Signed)
 Inpatient Rehab Admissions Coordinator:   Per therapy recommendations, patient was screened for CIR candidacy by Leita Kleine, MS, CCC-SLP. At this time, Pt. does not appear to demonstrate medical necessity to justify in hospital rehabilitation/CIR. will not pursue a rehab consult for this Pt.   Recommend other rehab venues to be pursued.  Please contact me with any questions.  Leita Kleine, MS, CCC-SLP

## 2024-01-16 LAB — BPAM RBC
Blood Product Expiration Date: 202509142359
ISSUE DATE / TIME: 202508171115
Unit Type and Rh: 5100

## 2024-01-16 LAB — GLUCOSE, CAPILLARY
Glucose-Capillary: 135 mg/dL — ABNORMAL HIGH (ref 70–99)
Glucose-Capillary: 136 mg/dL — ABNORMAL HIGH (ref 70–99)
Glucose-Capillary: 165 mg/dL — ABNORMAL HIGH (ref 70–99)
Glucose-Capillary: 126 mg/dL — ABNORMAL HIGH (ref 70–99)

## 2024-01-16 LAB — TYPE AND SCREEN
ABO/RH(D): O POS
Antibody Screen: NEGATIVE
Unit division: 0

## 2024-01-16 LAB — BASIC METABOLIC PANEL WITH GFR
Anion gap: 8 (ref 5–15)
BUN: 36 mg/dL — ABNORMAL HIGH (ref 8–23)
CO2: 23 mmol/L (ref 22–32)
Calcium: 8.4 mg/dL — ABNORMAL LOW (ref 8.9–10.3)
Chloride: 108 mmol/L (ref 98–111)
Creatinine, Ser: 2.17 mg/dL — ABNORMAL HIGH (ref 0.44–1.00)
GFR, Estimated: 23 mL/min — ABNORMAL LOW (ref >60)
Glucose, Bld: 113 mg/dL — ABNORMAL HIGH (ref 70–99)
Potassium: 3.7 mmol/L (ref 3.5–5.1)
Sodium: 139 mmol/L (ref 135–145)

## 2024-01-16 LAB — CBC
HCT: 25.6 % — ABNORMAL LOW (ref 36.0–46.0)
Hemoglobin: 7.8 g/dL — ABNORMAL LOW (ref 12.0–15.0)
MCH: 27.3 pg (ref 26.0–34.0)
MCHC: 30.5 g/dL (ref 30.0–36.0)
MCV: 89.5 fL (ref 80.0–100.0)
Platelets: 145 K/uL — ABNORMAL LOW (ref 150–400)
RBC: 2.86 MIL/uL — ABNORMAL LOW (ref 3.87–5.11)
RDW: 15.7 % — ABNORMAL HIGH (ref 11.5–15.5)
WBC: 11.7 K/uL — ABNORMAL HIGH (ref 4.0–10.5)
nRBC: 0 % (ref 0.0–0.2)

## 2024-01-16 MED ORDER — BISACODYL 10 MG RE SUPP: 10.0000 mg | Freq: Once | RECTAL | Status: DC

## 2024-01-16 MED ORDER — METHOCARBAMOL 750 MG PO TABS
750.0000 mg | ORAL_TABLET | Freq: Four times a day (QID) | ORAL | Status: DC
  Administered 2024-01-16 – 2024-01-19 (×13): 750 mg via ORAL
  Filled 2024-01-16 (×2): qty 1
  Filled 2024-01-16: qty 2
  Filled 2024-01-16 (×10): qty 1

## 2024-01-16 MED ORDER — HEPARIN SODIUM (PORCINE) 5000 UNIT/ML IJ SOLN
5000.0000 [IU] | Freq: Three times a day (TID) | INTRAMUSCULAR | Status: DC
  Administered 2024-01-17 – 2024-01-19 (×7): 5000 [IU] via SUBCUTANEOUS
  Filled 2024-01-16 (×7): qty 1

## 2024-01-16 MED ORDER — POLYETHYLENE GLYCOL 3350 17 G PO PACK
17.0000 g | PACK | Freq: Two times a day (BID) | ORAL | Status: DC
  Administered 2024-01-16 – 2024-01-19 (×7): 17 g via ORAL
  Filled 2024-01-16 (×7): qty 1

## 2024-01-16 MED ORDER — BISACODYL 5 MG PO TBEC
10.0000 mg | DELAYED_RELEASE_TABLET | Freq: Once | ORAL | Status: AC
  Administered 2024-01-16: 10 mg via ORAL
  Filled 2024-01-16: qty 2

## 2024-01-16 MED ORDER — SENNA 8.6 MG PO TABS
2.0000 | ORAL_TABLET | Freq: Once | ORAL | Status: AC
  Administered 2024-01-16: 17.2 mg via ORAL
  Filled 2024-01-16: qty 2

## 2024-01-16 MED ORDER — ACETAMINOPHEN 500 MG PO TABS
1000.0000 mg | ORAL_TABLET | Freq: Four times a day (QID) | ORAL | Status: DC
  Administered 2024-01-16 – 2024-01-19 (×10): 1000 mg via ORAL
  Filled 2024-01-16 (×11): qty 2
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
## 2024-01-17 LAB — BASIC METABOLIC PANEL WITH GFR
Anion gap: 11 (ref 5–15)
BUN: 31 mg/dL — ABNORMAL HIGH (ref 8–23)
CO2: 23 mmol/L (ref 22–32)
Calcium: 9.1 mg/dL (ref 8.9–10.3)
Chloride: 107 mmol/L (ref 98–111)
Creatinine, Ser: 1.76 mg/dL — ABNORMAL HIGH (ref 0.44–1.00)
GFR, Estimated: 29 mL/min — ABNORMAL LOW (ref >60)
Glucose, Bld: 158 mg/dL — ABNORMAL HIGH (ref 70–99)
Potassium: 3.8 mmol/L (ref 3.5–5.1)
Sodium: 141 mmol/L (ref 135–145)

## 2024-01-17 LAB — GLUCOSE, CAPILLARY
Glucose-Capillary: 125 mg/dL — ABNORMAL HIGH (ref 70–99)
Glucose-Capillary: 162 mg/dL — ABNORMAL HIGH (ref 70–99)
Glucose-Capillary: 136 mg/dL — ABNORMAL HIGH (ref 70–99)
Glucose-Capillary: 123 mg/dL — ABNORMAL HIGH (ref 70–99)

## 2024-01-17 LAB — CBC
HCT: 28.9 % — ABNORMAL LOW (ref 36.0–46.0)
Hemoglobin: 8.8 g/dL — ABNORMAL LOW (ref 12.0–15.0)
MCH: 27 pg (ref 26.0–34.0)
MCHC: 30.4 g/dL (ref 30.0–36.0)
MCV: 88.7 fL (ref 80.0–100.0)
Platelets: 206 K/uL (ref 150–400)
RBC: 3.26 MIL/uL — ABNORMAL LOW (ref 3.87–5.11)
RDW: 15.5 % (ref 11.5–15.5)
WBC: 11 K/uL — ABNORMAL HIGH (ref 4.0–10.5)
nRBC: 0 % (ref 0.0–0.2)

## 2024-01-17 MED ORDER — BISACODYL 10 MG RE SUPP
10.0000 mg | Freq: Once | RECTAL | Status: DC
  Filled 2024-01-17: qty 1

## 2024-01-17 MED ORDER — MAGNESIUM HYDROXIDE 400 MG/5ML PO SUSP
30.0000 mL | Freq: Once | ORAL | Status: AC
  Administered 2024-01-17: 30 mL via ORAL
  Filled 2024-01-17: qty 30

## 2024-01-17 MED ORDER — MORPHINE SULFATE (PF) 2 MG/ML IV SOLN
1.0000 mg | INTRAVENOUS | Status: DC | PRN
  Administered 2024-01-17: 2 mg via INTRAVENOUS
  Filled 2024-01-17: qty 1

## 2024-01-17 MED ORDER — SENNA 8.6 MG PO TABS
2.0000 | ORAL_TABLET | Freq: Once | ORAL | Status: AC
  Administered 2024-01-17: 17.2 mg via ORAL
  Filled 2024-01-17: qty 2

## 2024-01-17 MED ORDER — OXYCODONE HCL 5 MG PO TABS
5.0000 mg | ORAL_TABLET | ORAL | Status: DC | PRN
  Administered 2024-01-17 – 2024-01-19 (×3): 5 mg via ORAL
  Filled 2024-01-17 (×3): qty 1

## 2024-01-17 MED ORDER — BISACODYL 5 MG PO TBEC
10.0000 mg | DELAYED_RELEASE_TABLET | Freq: Once | ORAL | Status: AC
  Administered 2024-01-17: 10 mg via ORAL
  Filled 2024-01-17: qty 2
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
## 2024-01-17 NOTE — Care Management Important Message (Signed)
 Important Message  Patient Details  Name: Beth Malone MRN: [id number] Date of Birth: 29-Apr-1946   Important Message Given:  Yes - Medicare IM     Jon Cruel 01/17/2024, 2:41 PM

## 2024-01-17 NOTE — Progress Notes (Signed)
 Patient requested to have the dulcolax suppository for later.

## 2024-01-17 NOTE — TOC Progression Note (Signed)
 Transition of Care [facility]) - Progression Note    Patient Details  Name: Beth Malone MRN: [id number] Date of Birth: 1946-03-11  [facility] CM/SW Contact  Deniah Saia, Mliss HERO, RN Phone Number: 01/17/2024,12:26pm  Clinical Narrative:    Met with patient to discuss discharge planning; patient states she plans to discharge to her son [name]'s house in [place].  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  Case manager unable to secure home health services for patient due to [facility] and liability concerns with home health agency; patient understands this and is willing to go to outpatient therapy.  She prefers outpatient therapy at [facility], as she lives in [place].  She denies any other needs for home.  Will make referrals to [facility] for outpatient Physical and Occupational Therapy.   Expected Discharge Plan: OP Rehab Barriers to Discharge: Continued Medical Work up               Expected Discharge Plan and Services   Discharge Planning Services: CM Consult   Living arrangements for the past 2 months: Single Family Home                                       Social Drivers of Health (SDOH) Interventions SDOH Screenings   Food Insecurity: No Food Insecurity (01/14/2024)  Housing: Low Risk  (01/14/2024)  Transportation Needs: No Transportation Needs (01/14/2024)  Utilities: Not At Risk (01/14/2024)  Social Connections: Moderately Isolated (01/14/2024)  Tobacco Use: Low Risk  (01/13/2024)    Readmission Risk Interventions    01/16/2024   11:19 AM  Readmission Risk Prevention Plan  Transportation Screening Complete  PCP or Specialist Appt within 5-7 Days Complete  Home Care Screening Complete  Medication Review (RN CM) Complete    Mliss MICAEL Fass, RN, BSN  Trauma/Neuro ICU Case Manager [phone]

## 2024-01-17 NOTE — Plan of Care (Signed)
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
## 2024-01-18 LAB — GLUCOSE, CAPILLARY
Glucose-Capillary: 127 mg/dL — ABNORMAL HIGH (ref 70–99)
Glucose-Capillary: 192 mg/dL — ABNORMAL HIGH (ref 70–99)
Glucose-Capillary: 150 mg/dL — ABNORMAL HIGH (ref 70–99)
Glucose-Capillary: 204 mg/dL — ABNORMAL HIGH (ref 70–99)

## 2024-01-18 NOTE — Plan of Care (Signed)
   Problem: Education: Goal: Knowledge of General Education information will improve Description: Including pain rating scale, medication(s)/side effects and non-pharmacologic comfort measures Outcome: Progressing   Problem: Health Behavior/Discharge Planning: Goal: Ability to manage health-related needs will improve Outcome: Progressing   Problem: Clinical Measurements: Goal: Ability to maintain clinical measurements within normal limits will improve Outcome: Progressing Goal: Will remain free from infection Outcome: Progressing Goal: Diagnostic test results will improve Outcome: Progressing Goal: Respiratory complications will improve Outcome: Progressing Goal: Cardiovascular complication will be avoided Outcome: Progressing   Problem: Activity: Goal: Risk for activity intolerance will decrease Outcome: Progressing   Problem: Nutrition: Goal: Adequate nutrition will be maintained Outcome: Progressing   Problem: Coping: Goal: Level of anxiety will decrease Outcome: Progressing   Problem: Elimination: Goal: Will not experience complications related to bowel motility Outcome: Progressing   Problem: Pain Managment: Goal: General experience of comfort will improve and/or be controlled Outcome: Progressing   Problem: Safety: Goal: Ability to remain free from injury will improve Outcome: Progressing   Problem: Skin Integrity: Goal: Risk for impaired skin integrity will decrease Outcome: Progressing   Problem: Education: Goal: Ability to describe self-care measures that may prevent or decrease complications (Diabetes Survival Skills Education) will improve Outcome: Progressing Goal: Individualized Educational Video(s) Outcome: Progressing   Problem: Coping: Goal: Ability to adjust to condition or change in health will improve Outcome: Progressing   Problem: Fluid Volume: Goal: Ability to maintain a balanced intake and output will improve Outcome: Progressing    Problem: Health Behavior/Discharge Planning: Goal: Ability to identify and utilize available resources and services will improve Outcome: Progressing Goal: Ability to manage health-related needs will improve Outcome: Progressing   Problem: Metabolic: Goal: Ability to maintain appropriate glucose levels will improve Outcome: Progressing   Problem: Nutritional: Goal: Maintenance of adequate nutrition will improve Outcome: Progressing Goal: Progress toward achieving an optimal weight will improve Outcome: Progressing   Problem: Skin Integrity: Goal: Risk for impaired skin integrity will decrease Outcome: Progressing   Problem: Tissue Perfusion: Goal: Adequacy of tissue perfusion will improve Outcome: Progressing

## 2024-01-18 NOTE — Plan of Care (Signed)
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
## 2024-01-19 ENCOUNTER — Other Ambulatory Visit (HOSPITAL_COMMUNITY): Payer: Self-pay

## 2024-01-19 LAB — BASIC METABOLIC PANEL WITH GFR
Anion gap: 11 (ref 5–15)
BUN: 29 mg/dL — ABNORMAL HIGH (ref 8–23)
CO2: 22 mmol/L (ref 22–32)
Calcium: 9.2 mg/dL (ref 8.9–10.3)
Chloride: 106 mmol/L (ref 98–111)
Creatinine, Ser: 1.68 mg/dL — ABNORMAL HIGH (ref 0.44–1.00)
GFR, Estimated: 31 mL/min — ABNORMAL LOW (ref >60)
Glucose, Bld: 243 mg/dL — ABNORMAL HIGH (ref 70–99)
Potassium: 4 mmol/L (ref 3.5–5.1)
Sodium: 139 mmol/L (ref 135–145)

## 2024-01-19 LAB — GLUCOSE, CAPILLARY
Glucose-Capillary: 148 mg/dL — ABNORMAL HIGH (ref 70–99)
Glucose-Capillary: 223 mg/dL — ABNORMAL HIGH (ref 70–99)

## 2024-01-19 MED ORDER — POTASSIUM CHLORIDE 20 MEQ PO PACK
40.0000 meq | PACK | Freq: Once | ORAL | Status: AC
  Administered 2024-01-19: 40 meq via ORAL
  Filled 2024-01-19: qty 2

## 2024-01-19 MED ORDER — POLYETHYLENE GLYCOL 3350 17 GM/SCOOP PO POWD
17.0000 g | Freq: Two times a day (BID) | ORAL | 0 refills | Status: AC
Start: 2024-01-19 — End: ?
  Filled 2024-01-19: qty 238, 7d supply, fill #0

## 2024-01-19 MED ORDER — FUROSEMIDE 40 MG PO TABS
20.0000 mg | ORAL_TABLET | Freq: Two times a day (BID) | ORAL | 0 refills | Status: DC
  Filled 2024-01-19: qty 30, 30d supply, fill #0

## 2024-01-19 MED ORDER — DOCUSATE SODIUM 100 MG PO CAPS
100.0000 mg | ORAL_CAPSULE | Freq: Two times a day (BID) | ORAL | 0 refills | Status: DC
  Filled 2024-01-19: qty 10, 5d supply, fill #0

## 2024-01-19 MED ORDER — OXYCODONE HCL 5 MG PO TABS
5.0000 mg | ORAL_TABLET | Freq: Three times a day (TID) | ORAL | 0 refills | Status: DC | PRN
  Filled 2024-01-19: qty 15, 5d supply, fill #0

## 2024-01-19 MED ORDER — METHOCARBAMOL 750 MG PO TABS
750.0000 mg | ORAL_TABLET | Freq: Four times a day (QID) | ORAL | 0 refills | Status: AC | PRN
  Filled 2024-01-19: qty 40, 10d supply, fill #0

## 2024-01-19 MED ORDER — ACETAMINOPHEN 500 MG PO TABS: 1000.0000 mg | ORAL_TABLET | Freq: Four times a day (QID) | ORAL | Status: AC | PRN

## 2024-01-19 MED ORDER — FUROSEMIDE 10 MG/ML IJ SOLN
20.0000 mg | Freq: Once | INTRAMUSCULAR | Status: AC
  Administered 2024-01-19: 20 mg via INTRAVENOUS
  Filled 2024-01-19: qty 2
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
## 2024-01-19 NOTE — Progress Notes (Signed)
 SATURATION QUALIFICATIONS: (This note is used to comply with regulatory documentation for home oxygen)  Patient Saturations on Room Air at Rest = 96%  Patient Saturations on Room Air while Ambulating = 93%    Does not need home oxygen.    Macario RAMAN, PT Acute Rehabilitation Services  Office [phone]
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
## 2024-01-19 NOTE — Progress Notes (Signed)
 Patient and their daughter given discharge instructions. IV removed without issue. Medications retrieved from [facility] pharmacy. Patient's belongings collected and returned. Patient transported off unit in wheelchair. Departed campus in personal vehicle driven by daughter.

## 2024-01-19 NOTE — Plan of Care (Signed)

## 2024-01-19 NOTE — TOC Transition Note (Signed)
 Transition of Care [facility]) - Discharge Note   Patient Details  Name: Beth Malone MRN: [id number] Date of Birth: 06/19/1945  Transition of Care [facility]) CM/SW Contact:  Violia Knopf M, RN Phone Number: 01/19/2024, 11:50 AM   Clinical Narrative:    Patient medically stable for discharge home today with adult children to provide needed assistance.  Referrals have been made to [facility] for continued outpatient therapies.  No other discharge needs identified.    Final next level of care: OP Rehab Barriers to Discharge: Barriers Resolved   Patient Goals and CMS Choice Patient states their goals for this hospitalization and ongoing recovery are:: refuses SNF, wants home with [facility] CMS Medicare.gov Compare Post Acute Care list provided to:: Patient Choice offered to / list presented to : Patient                            Discharge Plan and Services Additional resources added to the After Visit Summary for     Discharge Planning Services: CM Consult                                 Social Drivers of Health (SDOH) Interventions SDOH Screenings   Food Insecurity: No Food Insecurity (01/14/2024)  Housing: Low Risk  (01/14/2024)  Transportation Needs: No Transportation Needs (01/14/2024)  Utilities: Not At Risk (01/14/2024)  Social Connections: Moderately Isolated (01/14/2024)  Tobacco Use: Low Risk  (01/13/2024)     Readmission Risk Interventions    01/16/2024   11:19 AM  Readmission Risk Prevention Plan  Transportation Screening Complete  PCP or Specialist Appt within 5-7 Days Complete  Home Care Screening Complete  Medication Review (RN CM) Complete    Mliss MICAEL Fass, RN, BSN  Trauma/Neuro ICU Case Manager [phone]

## 2024-01-27 DIAGNOSIS — E1121 Type 2 diabetes mellitus with diabetic nephropathy: Secondary | ICD-10-CM | POA: Diagnosis not present

## 2024-01-27 DIAGNOSIS — N184 Chronic kidney disease, stage 4 (severe): Secondary | ICD-10-CM | POA: Diagnosis not present

## 2024-01-27 DIAGNOSIS — S2241XD Multiple fractures of ribs, right side, subsequent encounter for fracture with routine healing: Secondary | ICD-10-CM | POA: Diagnosis not present

## 2024-01-31 DIAGNOSIS — S82001A Unspecified fracture of right patella, initial encounter for closed fracture: Secondary | ICD-10-CM | POA: Diagnosis not present

## 2024-02-28 DIAGNOSIS — S82001D Unspecified fracture of right patella, subsequent encounter for closed fracture with routine healing: Secondary | ICD-10-CM | POA: Diagnosis not present

## 2024-02-29 ENCOUNTER — Other Ambulatory Visit: Payer: Self-pay | Admitting: Internal Medicine

## 2024-02-29 DIAGNOSIS — Z0000 Encounter for general adult medical examination without abnormal findings: Secondary | ICD-10-CM

## 2024-03-21 ENCOUNTER — Ambulatory Visit
Admission: RE | Admit: 2024-03-21 | Discharge: 2024-03-21 | Disposition: A | Discharge status: Home / Self Care | Source: Ambulatory Visit | Attending: Internal Medicine | Admitting: Internal Medicine

## 2024-03-21 DIAGNOSIS — L853 Xerosis cutis: Secondary | ICD-10-CM | POA: Diagnosis not present

## 2024-03-21 DIAGNOSIS — M810 Age-related osteoporosis without current pathological fracture: Secondary | ICD-10-CM | POA: Diagnosis not present

## 2024-03-21 DIAGNOSIS — Z1231 Encounter for screening mammogram for malignant neoplasm of breast: Secondary | ICD-10-CM | POA: Diagnosis not present

## 2024-03-21 DIAGNOSIS — E538 Deficiency of other specified B group vitamins: Secondary | ICD-10-CM | POA: Diagnosis not present

## 2024-03-21 DIAGNOSIS — D631 Anemia in chronic kidney disease: Secondary | ICD-10-CM | POA: Diagnosis not present

## 2024-03-21 DIAGNOSIS — I129 Hypertensive chronic kidney disease with stage 1 through stage 4 chronic kidney disease, or unspecified chronic kidney disease: Secondary | ICD-10-CM | POA: Diagnosis not present

## 2024-03-21 DIAGNOSIS — E1121 Type 2 diabetes mellitus with diabetic nephropathy: Secondary | ICD-10-CM | POA: Diagnosis not present

## 2024-03-21 DIAGNOSIS — N184 Chronic kidney disease, stage 4 (severe): Secondary | ICD-10-CM | POA: Diagnosis not present

## 2024-03-21 DIAGNOSIS — Z0000 Encounter for general adult medical examination without abnormal findings: Secondary | ICD-10-CM

## 2024-03-26 ENCOUNTER — Other Ambulatory Visit: Payer: Self-pay | Admitting: Internal Medicine

## 2024-03-26 DIAGNOSIS — R928 Other abnormal and inconclusive findings on diagnostic imaging of breast: Secondary | ICD-10-CM

## 2024-03-27 DIAGNOSIS — S82001D Unspecified fracture of right patella, subsequent encounter for closed fracture with routine healing: Secondary | ICD-10-CM | POA: Diagnosis not present

## 2024-04-05 ENCOUNTER — Encounter

## 2024-04-06 ENCOUNTER — Ambulatory Visit
Admission: RE | Admit: 2024-04-06 | Discharge: 2024-04-06 | Disposition: A | Discharge status: Home / Self Care | Source: Ambulatory Visit | Attending: Internal Medicine | Admitting: Internal Medicine

## 2024-04-06 ENCOUNTER — Other Ambulatory Visit: Payer: Self-pay | Admitting: Internal Medicine

## 2024-04-06 DIAGNOSIS — R928 Other abnormal and inconclusive findings on diagnostic imaging of breast: Secondary | ICD-10-CM

## 2024-05-14 ENCOUNTER — Other Ambulatory Visit: Attending: Internal Medicine

## 2024-05-14 DIAGNOSIS — I350 Nonrheumatic aortic (valve) stenosis: Secondary | ICD-10-CM

## 2024-05-16 LAB — ECHOCARDIOGRAM COMPLETE
AR max vel: 0.74 cm2
AV Area VTI: 0.77 cm2
AV Area mean vel: 0.69 cm2
AV Mean grad: 32.5 mmHg
AV Peak grad: 55.1 mmHg
Ao pk vel: 3.71 m/s
Area-P 1/2: 3.17 cm2
Calc EF: 69 %
MV VTI: 1.76 cm2
S' Lateral: 3.1 cm
Single Plane A2C EF: 77.3 %
Single Plane A4C EF: 56 %

## 2024-05-29 MED ORDER — FUROSEMIDE 40 MG PO TABS: 40.0000 mg | ORAL_TABLET | Freq: Two times a day (BID) | ORAL | 3 refills | Status: AC

## 2024-05-29 NOTE — Telephone Encounter (Signed)
-----   Message from Vishnu Mallipeddi, MD sent at 05/29/2024  3:41 PM EST ----- Normal LV function, moderate LVH, indeterminate diastology with elevated LVEDP, normal RV function, moderate pulmonary hypertension, moderate to large pericardial effusion with no evidence of cardiac  tamponade, moderate to severe AS (DVI 0.23) and CVP 15 mmHg.  Volume overloaded based on echocardiogram.  Will need to evaluate her symptoms.  Currently on p.o. Lasix  40 mg twice daily, increased to 40 mg twice daily.  If she notices any symptoms of dizziness,  stop Lasix .  Schedule follow-up with APP/MD in 1 week.

## 2024-05-29 NOTE — Addendum Note (Signed)
 Addended by: KENETH ROSINA BROCKS on: 05/29/2024 04:57 PM   Modules accepted: Orders

## 2024-05-29 NOTE — Telephone Encounter (Signed)
 The patient has been notified of the result and verbalized understanding.  All questions (if any) were answered. Rosina JAYSON Cornea, CMA 05/29/2024 4:57 PM

## 2024-06-08 ENCOUNTER — Encounter: Payer: Self-pay | Admitting: Internal Medicine

## 2024-06-08 ENCOUNTER — Ambulatory Visit: Attending: Internal Medicine | Admitting: Internal Medicine

## 2024-06-08 VITALS — BP 200/112 | HR 80 | Ht 64.0 in | Wt 174.8 lb

## 2024-06-08 DIAGNOSIS — I5032 Chronic diastolic (congestive) heart failure: Secondary | ICD-10-CM | POA: Diagnosis not present

## 2024-06-08 DIAGNOSIS — I350 Nonrheumatic aortic (valve) stenosis: Secondary | ICD-10-CM | POA: Diagnosis not present

## 2024-06-08 DIAGNOSIS — I3139 Other pericardial effusion (noninflammatory): Secondary | ICD-10-CM | POA: Insufficient documentation

## 2024-06-08 NOTE — Patient Instructions (Signed)
 Medication Instructions:  Your physician recommends that you continue on your current medications as directed. Please refer to the Current Medication list given to you today.   Labwork: None  Testing/Procedures: None  Follow-Up: Your physician recommends that you schedule a follow-up appointment in: 6 months  Any Other Special Instructions Will Be Listed Below (If Applicable). Referral to Structural Heart  Thank you for choosing [facility]!     If you need a refill on your cardiac medications before your next appointment, please call your pharmacy.
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
## 2024-06-14 ENCOUNTER — Ambulatory Visit: Attending: Internal Medicine | Admitting: Internal Medicine

## 2024-06-14 ENCOUNTER — Encounter: Payer: Self-pay | Admitting: Internal Medicine

## 2024-06-14 VITALS — BP 162/90 | HR 88 | Ht 62.0 in | Wt 175.0 lb

## 2024-06-14 DIAGNOSIS — E119 Type 2 diabetes mellitus without complications: Secondary | ICD-10-CM

## 2024-06-14 DIAGNOSIS — I3139 Other pericardial effusion (noninflammatory): Secondary | ICD-10-CM | POA: Diagnosis not present

## 2024-06-14 DIAGNOSIS — N183 Chronic kidney disease, stage 3 unspecified: Secondary | ICD-10-CM | POA: Diagnosis not present

## 2024-06-14 DIAGNOSIS — E1169 Type 2 diabetes mellitus with other specified complication: Secondary | ICD-10-CM | POA: Diagnosis not present

## 2024-06-14 DIAGNOSIS — I350 Nonrheumatic aortic (valve) stenosis: Secondary | ICD-10-CM

## 2024-06-14 DIAGNOSIS — E1122 Type 2 diabetes mellitus with diabetic chronic kidney disease: Secondary | ICD-10-CM

## 2024-06-14 DIAGNOSIS — I700 Atherosclerosis of aorta: Secondary | ICD-10-CM | POA: Diagnosis not present

## 2024-06-14 DIAGNOSIS — E785 Hyperlipidemia, unspecified: Secondary | ICD-10-CM

## 2024-06-14 MED ORDER — ASPIRIN 81 MG PO TBEC: 81.0000 mg | DELAYED_RELEASE_TABLET | Freq: Every day | ORAL | Status: AC

## 2024-06-14 NOTE — Patient Instructions (Addendum)
 Medication Instructions:  Start Aspirin  81 mg daily- can get over the counter *If you need a refill on your cardiac medications before your next appointment, please call your pharmacy*  Lab Work: None ordered If you have labs (blood work) drawn today and your tests are completely normal, you will receive your results only by: MyChart Message (if you have MyChart) OR A paper copy in the mail If you have any lab test that is abnormal or we need to change your treatment, we will call you to review the results.  Testing/Procedures: Your physician has requested that you have an echocardiogram in 6 months. Echocardiography is a painless test that uses sound waves to create images of your heart. It provides your doctor with information about the size and shape of your heart and how well your hearts chambers and valves are working. This procedure takes approximately one hour. There are no restrictions for this procedure. Please do NOT wear cologne, perfume, aftershave, or lotions (deodorant is allowed). Please arrive 15 minutes prior to your appointment time.  Please note: We ask at that you not bring children with you during ultrasound (echo/ vascular) testing. Due to room size and safety concerns, children are not allowed in the ultrasound rooms during exams. Our front office staff cannot provide observation of children in our lobby area while testing is being conducted. An adult accompanying a patient to their appointment will only be allowed in the ultrasound room at the discretion of the ultrasound technician under special circumstances. We apologize for any inconvenience.   Follow-Up: At [facility], you and your health needs are our priority.  As part of our continuing mission to provide you with exceptional heart care, our providers are all part of one team.  This team includes your primary Cardiologist (physician) and Advanced Practice Providers or APPs (Physician Assistants and Nurse  Practitioners) who all work together to provide you with the care you need, when you need it.  Your next appointment:   6 month(s)- (after Echocardiogram)  Provider:   Dr Wendel  We recommend signing up for the patient portal called MyChart.  Sign up information is provided on this After Visit Summary.  MyChart is used to connect with patients for Virtual Visits (Telemedicine).  Patients are able to view lab/test results, encounter notes, upcoming appointments, etc.  Non-urgent messages can be sent to your provider as well.   To learn more about what you can do with MyChart, go to [email or web address].

## 2024-06-14 NOTE — Progress Notes (Signed)
 Pre Surgical Assessment: 5 M Walk Test  59M=16.69ft  5 Meter Walk Test- trial 1: 8.78 seconds 5 Meter Walk Test- trial 2: 7.89 seconds 5 Meter Walk Test- trial 3: 8.49 seconds 5 Meter Walk Test Average: 8.4 seconds

## 2024-06-27 ENCOUNTER — Other Ambulatory Visit: Payer: Self-pay

## 2024-10-05 ENCOUNTER — Encounter

## 2024-12-12 ENCOUNTER — Ambulatory Visit
